# Patient Record
Sex: Male | Born: 1935
Health system: Southern US, Community
[De-identification: ages and names within clinical notes are randomized; demographics above are authoritative.]

## PROBLEM LIST (undated history)

## (undated) DIAGNOSIS — G4730 Sleep apnea, unspecified: Secondary | ICD-10-CM

## (undated) DIAGNOSIS — L570 Actinic keratosis: Secondary | ICD-10-CM

## (undated) DIAGNOSIS — C801 Malignant (primary) neoplasm, unspecified: Secondary | ICD-10-CM

## (undated) DIAGNOSIS — R251 Tremor, unspecified: Secondary | ICD-10-CM

## (undated) DIAGNOSIS — N189 Chronic kidney disease, unspecified: Secondary | ICD-10-CM

## (undated) DIAGNOSIS — R600 Localized edema: Secondary | ICD-10-CM

## (undated) DIAGNOSIS — R609 Edema, unspecified: Secondary | ICD-10-CM

## (undated) DIAGNOSIS — C4492 Squamous cell carcinoma of skin, unspecified: Secondary | ICD-10-CM

## (undated) DIAGNOSIS — N400 Enlarged prostate without lower urinary tract symptoms: Secondary | ICD-10-CM

## (undated) DIAGNOSIS — M1990 Unspecified osteoarthritis, unspecified site: Secondary | ICD-10-CM

## (undated) DIAGNOSIS — H9313 Tinnitus, bilateral: Secondary | ICD-10-CM

## (undated) DIAGNOSIS — S065X9A Traumatic subdural hemorrhage with loss of consciousness of unspecified duration, initial encounter: Secondary | ICD-10-CM

## (undated) DIAGNOSIS — I10 Essential (primary) hypertension: Secondary | ICD-10-CM

## (undated) HISTORY — PX: EYE SURGERY: SHX253

## (undated) HISTORY — DX: Actinic keratosis: L57.0

## (undated) HISTORY — DX: Essential (primary) hypertension: I10

## (undated) HISTORY — PX: KNEE ARTHROSCOPY: SUR90

## (undated) HISTORY — DX: Squamous cell carcinoma of skin, unspecified: C44.92

## (undated) HISTORY — PX: COLONOSCOPY: SHX174

## (undated) HISTORY — DX: Unspecified osteoarthritis, unspecified site: M19.90

---
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
## 1962-10-01 HISTORY — PX: HERNIA REPAIR: SHX51

## 2001-10-01 HISTORY — PX: SEPTOPLASTY: SUR1290

## 2002-10-01 HISTORY — PX: OTHER SURGICAL HISTORY: SHX169

## 2004-12-15 ENCOUNTER — Ambulatory Visit: Payer: Self-pay | Admitting: General Surgery

## 2010-01-31 ENCOUNTER — Ambulatory Visit: Payer: Self-pay | Admitting: Unknown Physician Specialty

## 2010-02-17 ENCOUNTER — Ambulatory Visit: Payer: Self-pay | Admitting: Unknown Physician Specialty

## 2010-02-23 ENCOUNTER — Ambulatory Visit: Payer: Self-pay | Admitting: Unknown Physician Specialty

## 2013-06-09 ENCOUNTER — Ambulatory Visit: Payer: Self-pay | Admitting: Otolaryngology

## 2013-06-11 ENCOUNTER — Ambulatory Visit: Payer: Self-pay | Admitting: Otolaryngology

## 2014-02-26 DIAGNOSIS — I10 Essential (primary) hypertension: Secondary | ICD-10-CM | POA: Insufficient documentation

## 2014-02-26 DIAGNOSIS — R9431 Abnormal electrocardiogram [ECG] [EKG]: Secondary | ICD-10-CM | POA: Insufficient documentation

## 2014-02-26 DIAGNOSIS — E782 Mixed hyperlipidemia: Secondary | ICD-10-CM | POA: Insufficient documentation

## 2014-09-23 DIAGNOSIS — G4733 Obstructive sleep apnea (adult) (pediatric): Secondary | ICD-10-CM | POA: Insufficient documentation

## 2014-10-21 ENCOUNTER — Ambulatory Visit: Payer: Self-pay | Admitting: Internal Medicine

## 2015-01-21 NOTE — Op Note (Signed)
PATIENT NAME:  [facility], Preston Hill MR#:  [id number] DATE OF BIRTH:  [birth year]  DATE OF PROCEDURE:  06/11/2013  PREOPERATIVE DIAGNOSIS: Bilateral ptosis (MRD-1 of 0 on the right, 0.5 on the left).   POSTOPERATIVE DIAGNOSIS: Bilateral ptosis (MRD-1 of 0 on the right, 0.5 on the left).   PROCEDURE: Bilateral ptosis repair (Fasanella-Servat).   SURGEON: Janalee Dane, M.D.   DESCRIPTION OF PROCEDURE: The patient was placed in a supine position on the operating room table. After general LMA anesthesia had been induced, the patient was turned 90 degrees counterclockwise from anesthesia. The eyes were topically anesthetized with topical proparacaine followed by local injection of 0.5% lidocaine and 0.25% bupivacaine mixed 1:150,000 with epinephrine along the superior margin of the upper eyelid superior tarsal plate. Preoperative measurements of approximately 8 mm of conjunctiva and Mueller's muscle was determined to be the appropriate amount to remove. Therefore, that amount was gathered in the Putterman clamp on the right after the 5-0 silk retaining suture had been placed. A single row of horizontal mattress 5-0 fast absorbing gut on a S-14 needle was placed from medial to lateral. The conjunctiva and Mueller's muscle and the Putterman clamp was then removed sharply and a second row of suture was placed from medial to lateral tying the knot laterally and burying it in the lateral conjunctiva. The eye was then returned to its native position, iced gauze was immediately placed and an identical procedure was performed on the left side taking very slightly less conjunctiva.  Gentak eye ointment was then placed under the eyes. Iced gauze was left on the eyes as the patient was taken to the recovery room. There were no complications. Estimated blood loss less than 5 mL.  ____________________________ J. Nadeen Landau, MD [facility] D: 06/11/2013 09:55:03 ET T: 06/11/2013 10:02:38 ET JOB#: [id number]  cc: Janalee Dane, MD, <Dictator> Nicholos Johns MD ELECTRONICALLY SIGNED 06/26/2013 9:58

## 2015-12-13 ENCOUNTER — Ambulatory Visit: Payer: Self-pay | Admitting: General Surgery

## 2015-12-20 ENCOUNTER — Encounter: Payer: Self-pay | Admitting: General Surgery

## 2015-12-20 ENCOUNTER — Other Ambulatory Visit: Payer: Self-pay | Admitting: General Surgery

## 2015-12-20 ENCOUNTER — Ambulatory Visit (INDEPENDENT_AMBULATORY_CARE_PROVIDER_SITE_OTHER): Payer: Medicare PPO | Admitting: General Surgery

## 2015-12-20 VITALS — BP 140/82 | HR 76 | Resp 14 | Ht 64.0 in | Wt 197.0 lb

## 2015-12-20 DIAGNOSIS — Z1211 Encounter for screening for malignant neoplasm of colon: Secondary | ICD-10-CM

## 2015-12-20 MED ORDER — POLYETHYLENE GLYCOL 3350 17 GM/SCOOP PO POWD: 1.0000 | Freq: Once | ORAL | Status: DC

## 2015-12-20 NOTE — Progress Notes (Signed)
Patient ID: Preston Hill, male   DOB: [birth year], 82 y.o.   MRN: [id number]  Chief Complaint  Patient presents with  . Colonoscopy    HPI Preston Hill is a 82 y.o. male here today for a evaluation of a screening colonoscopy. Last colonoscopy was 12/05/2004. No GI problems at this time.  I personally reviewed the patient's history. HPI  Past Medical History  Diagnosis Date  . Hypertension   . Arthritis     Past Surgical History  Procedure Laterality Date  . Colonoscopy  3817/2006  . Septoplasty  2003  . Uvul  2004    repair  . Hernia repair  1964    No family history on file.  Social History Social History  Substance Use Topics  . Smoking status: Never Smoker   . Smokeless tobacco: None  . Alcohol Use: No    Allergies  Allergen Reactions  . Codeine Anxiety    Sick     Current Outpatient Prescriptions  Medication Sig Dispense Refill  . amLODipine (NORVASC) 5 MG tablet Take 5 mg by mouth daily.    . Glucosamine Sulfate 1000 MG CAPS Take by mouth.    Marland Kitchen lisinopril-hydrochlorothiazide (PRINZIDE,ZESTORETIC) 20-12.5 MG tablet Take 1 tablet by mouth daily.    . Potassium Chloride Crys CR (KLOR-CON M20 PO) Take by mouth.    . propranolol (INDERAL) 40 MG tablet Take 40 mg by mouth 3 (three) times daily.    . tamsulosin (FLOMAX) 0.4 MG CAPS capsule Take 0.4 mg by mouth.    . vitamin E (VITAMIN E) 400 UNIT capsule Take 400 Units by mouth daily.    . polyethylene glycol powder (GLYCOLAX/MIRALAX) powder Take 255 g by mouth once. 255 grams one bottle for colonoscopy prep 255 g 0   No current facility-administered medications for this visit.    Review of Systems Review of Systems  Constitutional: Negative.   Respiratory: Negative.   Cardiovascular: Negative.     Blood pressure 140/82, pulse 76, resp. rate 14, height 5\' 4"  (1.626 m), weight 197 lb (89.359 kg).  Physical Exam Physical Exam  Constitutional: He is oriented to person, place, and time. He appears  well-developed and well-nourished.  Eyes: Conjunctivae are normal. No scleral icterus.  Neck: Neck supple.  Cardiovascular: Normal rate, regular rhythm and normal heart sounds.   Pulmonary/Chest: Effort normal and breath sounds normal.  Lymphadenopathy:    He has no cervical adenopathy.  Neurological: He is alert and oriented to person, place, and time.  Skin: Skin is warm and dry.    Data Reviewed Colonoscopy completed on 12/16/2014 showed a single hyperplastic polyp in the sigmoid colon.  November 20, 1999 colonoscopy showed a tubular adenoma in the transverse colon. I reticulosis was noted on both exams.  Assessment    Candidate for screening colonoscopy.    Plan    Screening indications were reviewed.    Colonoscopy with possible biopsy/polypectomy prn: Information regarding the procedure, including its potential risks and complications (including but not limited to perforation of the bowel, which may require emergency surgery to repair, and bleeding) was verbally given to the patient. Educational information regarding lower intestinal endoscopy was given to the patient. Written instructions for how to complete the bowel prep using Miralax were provided. The importance of drinking ample fluids to avoid dehydration as a result of the prep emphasized. PCP:  Benita Stabile C This information has been scribed by Gaspar Cola CMA.   [facility] 12/20/2015, 9:02 PM

## 2015-12-20 NOTE — Patient Instructions (Addendum)
Colonoscopy A colonoscopy is an exam to look at the entire large intestine (colon). This exam can help find problems such as tumors, polyps, inflammation, and areas of bleeding. The exam takes about 1 hour.  LET [facility] CARE PROVIDER KNOW ABOUT:   Any allergies you have.  All medicines you are taking, including vitamins, herbs, eye drops, creams, and over-the-counter medicines.  Previous problems you or members of your family have had with the use of anesthetics.  Any blood disorders you have.  Previous surgeries you have had.  Medical conditions you have. RISKS AND COMPLICATIONS  Generally, this is a safe procedure. However, as with any procedure, complications can occur. Possible complications include:  Bleeding.  Tearing or rupture of the colon wall.  Reaction to medicines given during the exam.  Infection (rare). BEFORE THE PROCEDURE   Ask your health care provider about changing or stopping your regular medicines.  You may be prescribed an oral bowel prep. This involves drinking a large amount of medicated liquid, starting the day before your procedure. The liquid will cause you to have multiple loose stools until your stool is almost clear or light green. This cleans out your colon in preparation for the procedure.  Do not eat or drink anything else once you have started the bowel prep, unless your health care provider tells you it is safe to do so.  Arrange for someone to drive you home after the procedure. PROCEDURE   You will be given medicine to help you relax (sedative).  You will lie on your side with your knees bent.  A long, flexible tube with a light and camera on the end (colonoscope) will be inserted through the rectum and into the colon. The camera sends video back to a computer screen as it moves through the colon. The colonoscope also releases carbon dioxide gas to inflate the colon. This helps your health care provider see the area better.  During  the exam, your health care provider may take a small tissue sample (biopsy) to be examined under a microscope if any abnormalities are found.  The exam is finished when the entire colon has been viewed. AFTER THE PROCEDURE   Do not drive for 24 hours after the exam.  You may have a small amount of blood in your stool.  You may pass moderate amounts of gas and have mild abdominal cramping or bloating. This is caused by the gas used to inflate your colon during the exam.  Ask when your test results will be ready and how you will get your results. Make sure you get your test results.   This information is not intended to replace advice given to you by your health care provider. Make sure you discuss any questions you have with your health care provider.   Document Released: 09/14/2000 Document Revised: 07/08/2013 Document Reviewed: 05/25/2013 Elsevier Interactive Patient Education [employer].  Patient has been scheduled for a colonoscopy on Feb 01, 2016.  Prescription for Miralax sent to [facility].  Patient has been given prep instructions.

## 2015-12-20 NOTE — H&P (Signed)
HPI  [facility] Hill is a 82 y.o. male here today for a evaluation of a screening colonoscopy. Last colonoscopy was 12/05/2004. No GI problems at this time.  I personally reviewed the patient's history.  HPI  Past Medical History   Diagnosis  Date   .  Hypertension    .  Arthritis     Past Surgical History   Procedure  Laterality  Date   .  Colonoscopy   3817/2006   .  Septoplasty   2003   .  Uvul   2004     repair   .  Hernia repair   1964    No family history on file.  Social History  Social History   Substance Use Topics   .  Smoking status:  Never Smoker   .  Smokeless tobacco:  None   .  Alcohol Use:  No    Allergies   Allergen  Reactions   .  Codeine  Anxiety     Sick    Current Outpatient Prescriptions   Medication  Sig  Dispense  Refill   .  amLODipine (NORVASC) 5 MG tablet  Take 5 mg by mouth daily.     .  Glucosamine Sulfate 1000 MG CAPS  Take by mouth.     Marland Kitchen  lisinopril-hydrochlorothiazide (PRINZIDE,ZESTORETIC) 20-12.5 MG tablet  Take 1 tablet by mouth daily.     .  Potassium Chloride Crys CR (KLOR-CON M20 PO)  Take by mouth.     .  propranolol (INDERAL) 40 MG tablet  Take 40 mg by mouth 3 (three) times daily.     .  tamsulosin (FLOMAX) 0.4 MG CAPS capsule  Take 0.4 mg by mouth.     .  vitamin E (VITAMIN E) 400 UNIT capsule  Take 400 Units by mouth daily.     .  polyethylene glycol powder (GLYCOLAX/MIRALAX) powder  Take 255 g by mouth once. 255 grams one bottle for colonoscopy prep  255 g  0    No current facility-administered medications for this visit.    Review of Systems  Review of Systems  Constitutional: Negative.  Respiratory: Negative.  Cardiovascular: Negative.   Blood pressure 140/82, pulse 76, resp. rate 14, height 5\' 4"  (1.626 m), weight 197 lb (89.359 kg).  Physical Exam  Physical Exam  Constitutional: He is oriented to person, place, and time. He appears well-developed and well-nourished.  Eyes: Conjunctivae are normal. No scleral icterus.   Neck: Neck supple.  Cardiovascular: Normal rate, regular rhythm and normal heart sounds.  Pulmonary/Chest: Effort normal and breath sounds normal.  Lymphadenopathy:  He has no cervical adenopathy.  Neurological: He is alert and oriented to person, place, and time.  Skin: Skin is warm and dry.   Data Reviewed  Colonoscopy completed on 12/16/2014 showed a single hyperplastic polyp in the sigmoid colon.  November 20, 1999 colonoscopy showed a tubular adenoma in the transverse colon. I reticulosis was noted on both exams.  Assessment   Candidate for screening colonoscopy.   Plan   Screening indications were reviewed.   Colonoscopy with possible biopsy/polypectomy prn: Information regarding the procedure, including its potential risks and complications (including but not limited to perforation of the bowel, which may require emergency surgery to repair, and bleeding) was verbally given to the patient. Educational information regarding lower intestinal endoscopy was given to the patient. Written instructions for how to complete the bowel prep using Miralax were provided. The importance of drinking ample fluids to  avoid dehydration as a result of the prep emphasized.  PCP: Benita Stabile C  This information has been scribed by Gaspar Cola CMA.  Robert Bellow  12/20/2015, 9:02 PM

## 2016-01-30 ENCOUNTER — Telehealth: Payer: Self-pay | Admitting: *Deleted

## 2016-01-30 NOTE — Telephone Encounter (Signed)
Patient contacted today and confirms no medication changes since last office visit. He also states he has Miralax prescription.   We will proceed with colonoscopy as scheduled for 02-01-16 at [facility].   This patient was instructed to call the office should he have further questions.

## 2016-01-31 ENCOUNTER — Encounter: Payer: Self-pay | Admitting: *Deleted

## 2016-02-01 ENCOUNTER — Ambulatory Visit
Admission: RE | Admit: 2016-02-01 | Discharge: 2016-02-01 | Disposition: A | Discharge status: Home / Self Care | Payer: Medicare PPO | Source: Ambulatory Visit | Attending: General Surgery | Admitting: General Surgery

## 2016-02-01 ENCOUNTER — Ambulatory Visit: Payer: Medicare PPO | Admitting: *Deleted

## 2016-02-01 ENCOUNTER — Encounter
Admission: RE | Disposition: A | Discharge status: Home / Self Care | Payer: Self-pay | Source: Ambulatory Visit | Attending: General Surgery

## 2016-02-01 ENCOUNTER — Encounter: Payer: Self-pay | Admitting: *Deleted

## 2016-02-01 DIAGNOSIS — Z7982 Long term (current) use of aspirin: Secondary | ICD-10-CM | POA: Insufficient documentation

## 2016-02-01 DIAGNOSIS — G4730 Sleep apnea, unspecified: Secondary | ICD-10-CM | POA: Insufficient documentation

## 2016-02-01 DIAGNOSIS — Z79899 Other long term (current) drug therapy: Secondary | ICD-10-CM | POA: Insufficient documentation

## 2016-02-01 DIAGNOSIS — Z1211 Encounter for screening for malignant neoplasm of colon: Secondary | ICD-10-CM | POA: Diagnosis not present

## 2016-02-01 DIAGNOSIS — I129 Hypertensive chronic kidney disease with stage 1 through stage 4 chronic kidney disease, or unspecified chronic kidney disease: Secondary | ICD-10-CM | POA: Insufficient documentation

## 2016-02-01 DIAGNOSIS — K5730 Diverticulosis of large intestine without perforation or abscess without bleeding: Secondary | ICD-10-CM | POA: Diagnosis not present

## 2016-02-01 DIAGNOSIS — N189 Chronic kidney disease, unspecified: Secondary | ICD-10-CM | POA: Diagnosis not present

## 2016-02-01 HISTORY — PX: COLONOSCOPY WITH PROPOFOL: SHX5780

## 2016-02-01 HISTORY — DX: Chronic kidney disease, unspecified: N18.9

## 2016-02-01 HISTORY — DX: Sleep apnea, unspecified: G47.30

## 2016-02-01 SURGERY — COLONOSCOPY WITH PROPOFOL
Anesthesia: General

## 2016-02-01 MED ORDER — PROPOFOL 500 MG/50ML IV EMUL
INTRAVENOUS | Status: DC | PRN
  Administered 2016-02-01: 50 ug/kg/min via INTRAVENOUS

## 2016-02-01 MED ORDER — SODIUM CHLORIDE 0.9 % IV SOLN
INTRAVENOUS | Status: DC
  Administered 2016-02-01: 1000 mL via INTRAVENOUS

## 2016-02-01 NOTE — Transfer of Care (Signed)
Immediate Anesthesia Transfer of Care Note  Patient: [facility] Hill  Procedure(s) Performed: Procedure(s): COLONOSCOPY WITH PROPOFOL (N/A)  Patient Location: PACU  Anesthesia Type:General  Level of Consciousness: awake, alert  and oriented  Airway & Oxygen Therapy: Patient Spontanous Breathing and Patient connected to nasal cannula oxygen  Post-op Assessment: Report given to RN and Post -op Vital signs reviewed and stable  Post vital signs: Reviewed and stable  Last Vitals:  Filed Vitals:   02/01/16 0808  BP: 175/80  Pulse: 54  Temp: 36.5 C  Resp: 16    Last Pain: There were no vitals filed for this visit.       Complications: No apparent anesthesia complications

## 2016-02-01 NOTE — Anesthesia Preprocedure Evaluation (Signed)
Anesthesia Evaluation  Patient identified by MRN, date of birth, ID band Patient awake    Reviewed: Allergy & Precautions, H&P , NPO status , Patient's Chart, lab work & pertinent test results, reviewed documented beta blocker date and time   History of Anesthesia Complications Negative for: history of anesthetic complications  Airway Mallampati: III  TM Distance: >3 FB Neck ROM: full    Dental no notable dental hx. (+) Teeth Intact   Pulmonary neg shortness of breath, sleep apnea and Continuous Positive Airway Pressure Ventilation , neg COPD, neg recent URI,    Pulmonary exam normal breath sounds clear to auscultation       Cardiovascular Exercise Tolerance: Good hypertension, (-) angina(-) CAD, (-) Past MI, (-) Cardiac Stents and (-) CABG Normal cardiovascular exam(-) dysrhythmias (-) Valvular Problems/Murmurs Rhythm:regular Rate:Normal     Neuro/Psych negative neurological ROS  negative psych ROS   GI/Hepatic negative GI ROS, Neg liver ROS,   Endo/Other  negative endocrine ROS  Renal/GU CRFRenal disease  negative genitourinary   Musculoskeletal   Abdominal   Peds  Hematology negative hematology ROS (+)   Anesthesia Other Findings Past Medical History:   Hypertension                                                 Arthritis                                                    Sleep apnea                                                  Chronic kidney disease                                       Reproductive/Obstetrics negative OB ROS                             Anesthesia Physical Anesthesia Plan  ASA: II  Anesthesia Plan: General   Post-op Pain Management:    Induction:   Airway Management Planned:   Additional Equipment:   Intra-op Plan:   Post-operative Plan:   Informed Consent: I have reviewed the patients History and Physical, chart, labs and discussed the procedure  including the risks, benefits and alternatives for the proposed anesthesia with the patient or authorized representative who has indicated his/her understanding and acceptance.   Dental Advisory Given  Plan Discussed with: Anesthesiologist, CRNA and Surgeon  Anesthesia Plan Comments:         Anesthesia Quick Evaluation

## 2016-02-01 NOTE — Anesthesia Postprocedure Evaluation (Signed)
Anesthesia Post Note  Patient: Preston Hill  Procedure(s) Performed: Procedure(s) (LRB): COLONOSCOPY WITH PROPOFOL (N/A)  Patient location during evaluation: Endoscopy Anesthesia Type: General Level of consciousness: awake and alert Pain management: pain level controlled Vital Signs Assessment: post-procedure vital signs reviewed and stable Respiratory status: spontaneous breathing, nonlabored ventilation, respiratory function stable and patient connected to nasal cannula oxygen Cardiovascular status: blood pressure returned to baseline and stable Postop Assessment: no signs of nausea or vomiting Anesthetic complications: no    Last Vitals:  Filed Vitals:   02/01/16 0921 02/01/16 0931  BP: 122/78 140/79  Pulse: 55 48  Temp:    Resp: 19 16    Last Pain: There were no vitals filed for this visit.               Martha Clan

## 2016-02-01 NOTE — H&P (Signed)
ISAIAHS FRANKO [id number] [birth year]     HPI:  Healthy 82 y./o male for elective screening colonoscopy. No change in clinical history or exam.   Prescriptions prior to admission  Medication Sig Dispense Refill Last Dose  . amLODipine (NORVASC) 5 MG tablet Take 5 mg by mouth daily.   02/01/2016 at 0615  . aspirin 81 MG tablet Take 81 mg by mouth daily.   02/01/2016 at 0615  . lisinopril-hydrochlorothiazide (PRINZIDE,ZESTORETIC) 20-12.5 MG tablet Take 1 tablet by mouth daily.   02/01/2016 at 0615  . Potassium Chloride Crys CR (KLOR-CON M20 PO) Take by mouth.   02/01/2016 at 0615  . propranolol (INDERAL) 40 MG tablet Take 40 mg by mouth 2 (two) times daily.   02/01/2016 at 0615  . tamsulosin (FLOMAX) 0.4 MG CAPS capsule Take 0.4 mg by mouth.    at Unknown time  . vitamin E (VITAMIN E) 400 UNIT capsule Take 400 Units by mouth daily.   02/01/2016 at 0615  . Glucosamine Sulfate 1000 MG CAPS Take by mouth.   Taking  . polyethylene glycol powder (GLYCOLAX/MIRALAX) powder Take 255 g by mouth once. 255 grams one bottle for colonoscopy prep 255 g 0    Allergies  Allergen Reactions  . Codeine Anxiety    Sick    Past Medical History  Diagnosis Date  . Hypertension   . Arthritis   . Sleep apnea   . Chronic kidney disease    Past Surgical History  Procedure Laterality Date  . Colonoscopy  3817/2006  . Septoplasty  2003  . Uvul  2004    repair  . Hernia repair  1964   Social History   Social History  . Marital Status: Married    Spouse Name: N/A  . Number of Children: N/A  . Years of Education: N/A   Occupational History  . Not on file.   Social History Main Topics  . Smoking status: Never Smoker   . Smokeless tobacco: Never Used  . Alcohol Use: No  . Drug Use: No  . Sexual Activity: Not on file   Other Topics Concern  . Not on file   Social History Narrative   Social History   Social History Narrative     ROS: Negative.     PE: HEENT: Negative. Lungs: Clear. Cardio:  RR.  Assessment/Plan:  Proceed with planned endoscopy.    Robert Bellow 02/01/2016

## 2016-02-01 NOTE — Op Note (Signed)
[facility] Patient Name: Preston Hill Procedure Date: 02/01/2016 8:39 AM MRN: [id number] Account #: [id number] Date of Birth: 1934-08-13 Admit Type: Outpatient Age: 82 Room: [facility] ENDO ROOM 1 Gender: Male Note Status: Finalized Procedure:            Colonoscopy Indications:          Screening for colorectal malignant neoplasm Providers:            Robert Bellow, MD Referring MD:         Leona Carry. Hall Busing, MD (Referring MD) Medicines:            Monitored Anesthesia Care Complications:        No immediate complications. Procedure:            Pre-Anesthesia Assessment:                       - Prior to the procedure, a History and Physical was                        performed, and patient medications, allergies and                        sensitivities were reviewed. The patient's tolerance of                        previous anesthesia was reviewed.                       - The risks and benefits of the procedure and the                        sedation options and risks were discussed with the                        patient. All questions were answered and informed                        consent was obtained.                       After obtaining informed consent, the colonoscope was                        passed under direct vision. Throughout the procedure,                        the patient's blood pressure, pulse, and oxygen                        saturations were monitored continuously. The                        Colonoscope was introduced through the anus and                        advanced to the the terminal ileum. The colonoscopy was                        performed without difficulty. The patient tolerated the  procedure well. The quality of the bowel preparation                        was excellent. Findings:      A few medium-mouthed diverticula were found in the sigmoid colon and       ascending colon.      The  retroflexed view of the distal rectum and anal verge was normal and       showed no anal or rectal abnormalities. Impression:           - Diverticulosis in the sigmoid colon and in the                        ascending colon.                       - The distal rectum and anal verge are normal on                        retroflexion view.                       - No specimens collected. Recommendation:       - Discharge patient to home (via wheelchair). Procedure Code(s):    --- Professional ---                       [phone], Colonoscopy, flexible; diagnostic, including                        collection of specimen(s) by brushing or washing, when                        performed (separate procedure) Diagnosis Code(s):    --- Professional ---                       Z12.11, Encounter for screening for malignant neoplasm                        of colon CPT copyright 2016 [employer]. All rights reserved. The codes documented in this report are preliminary and upon coder review may  be revised to meet current compliance requirements. Robert Bellow, MD 02/01/2016 8:59:20 AM This report has been signed electronically. Number of Addenda: 0 Note Initiated On: 02/01/2016 8:39 AM Scope Withdrawal Time: 0 hours 6 minutes 40 seconds  Total Procedure Duration: 0 hours 13 minutes 1 second       [facility]

## 2016-02-03 ENCOUNTER — Encounter: Payer: Self-pay | Admitting: General Surgery

## 2016-02-22 ENCOUNTER — Encounter: Payer: Self-pay | Admitting: General Surgery

## 2016-03-01 ENCOUNTER — Encounter: Payer: Self-pay | Admitting: General Surgery

## 2016-03-13 ENCOUNTER — Encounter: Payer: Self-pay | Admitting: General Surgery

## 2017-03-12 ENCOUNTER — Other Ambulatory Visit: Payer: Self-pay | Admitting: Surgery

## 2017-03-12 DIAGNOSIS — M1712 Unilateral primary osteoarthritis, left knee: Secondary | ICD-10-CM

## 2017-03-18 ENCOUNTER — Ambulatory Visit
Admission: RE | Admit: 2017-03-18 | Discharge: 2017-03-18 | Disposition: A | Discharge status: Home / Self Care | Payer: Medicare PPO | Source: Ambulatory Visit | Attending: Surgery | Admitting: Surgery

## 2017-03-18 DIAGNOSIS — M1712 Unilateral primary osteoarthritis, left knee: Secondary | ICD-10-CM | POA: Insufficient documentation

## 2017-03-18 DIAGNOSIS — X58XXXA Exposure to other specified factors, initial encounter: Secondary | ICD-10-CM | POA: Diagnosis not present

## 2017-03-18 DIAGNOSIS — S83242A Other tear of medial meniscus, current injury, left knee, initial encounter: Secondary | ICD-10-CM | POA: Diagnosis not present

## 2017-03-22 DIAGNOSIS — M1712 Unilateral primary osteoarthritis, left knee: Secondary | ICD-10-CM | POA: Insufficient documentation

## 2017-04-25 ENCOUNTER — Encounter
Admission: RE | Admit: 2017-04-25 | Discharge: 2017-04-25 | Disposition: A | Discharge status: Home / Self Care | Payer: Medicare PPO | Source: Ambulatory Visit | Attending: Surgery | Admitting: Surgery

## 2017-04-25 DIAGNOSIS — I10 Essential (primary) hypertension: Secondary | ICD-10-CM | POA: Diagnosis not present

## 2017-04-25 DIAGNOSIS — Z01818 Encounter for other preprocedural examination: Secondary | ICD-10-CM | POA: Diagnosis present

## 2017-04-25 HISTORY — DX: Tremor, unspecified: R25.1

## 2017-04-25 HISTORY — DX: Malignant (primary) neoplasm, unspecified: C80.1

## 2017-04-25 HISTORY — DX: Tinnitus, bilateral: H93.13

## 2017-04-25 LAB — BASIC METABOLIC PANEL
Anion gap: 8 (ref 5–15)
BUN: 16 mg/dL (ref 6–20)
CALCIUM: 9.2 mg/dL (ref 8.9–10.3)
CO2: 27 mmol/L (ref 22–32)
Chloride: 105 mmol/L (ref 101–111)
Creatinine, Ser: 0.78 mg/dL (ref 0.61–1.24)
GFR calc Af Amer: >60 mL/min (ref >60)
GLUCOSE: 107 mg/dL — AB (ref 65–99)
Potassium: 3.6 mmol/L (ref 3.5–5.1)
Sodium: 140 mmol/L (ref 135–145)

## 2017-04-25 LAB — CBC
HEMATOCRIT: 41.4 % (ref 40.0–52.0)
Hemoglobin: 14.3 g/dL (ref 13.0–18.0)
MCH: 31 pg (ref 26.0–34.0)
MCHC: 34.6 g/dL (ref 32.0–36.0)
MCV: 89.7 fL (ref 80.0–100.0)
Platelets: 161 10*3/uL (ref 150–440)
RBC: 4.61 MIL/uL (ref 4.40–5.90)
RDW: 12.7 % (ref 11.5–14.5)
WBC: 4.8 10*3/uL (ref 3.8–10.6)

## 2017-04-25 LAB — DIFFERENTIAL
BASOS PCT: 1 %
Basophils Absolute: 0 10*3/uL (ref 0–0.1)
EOS ABS: 0.1 10*3/uL (ref 0–0.7)
Eosinophils Relative: 3 %
Lymphocytes Relative: 31 %
Lymphs Abs: 1.5 10*3/uL (ref 1.0–3.6)
MONO ABS: 0.4 10*3/uL (ref 0.2–1.0)
MONOS PCT: 10 %
Neutro Abs: 2.6 10*3/uL (ref 1.4–6.5)
Neutrophils Relative %: 55 %

## 2017-04-25 NOTE — Patient Instructions (Signed)
Your procedure is scheduled on: May 02, 2017  (THURSDAY)  Report to Same Day Surgery 2nd floor medical mall [facility] Entrance-take elevator on left to 2nd floor.  Check in with surgery information desk.) To find out your arrival time please call [phone] between 1PM - 3PM on May 01, 2017 [facility] )  Remember: Instructions that are not followed completely may result in serious medical risk, up to and including death, or upon the discretion of your surgeon and anesthesiologist your surgery may need to be rescheduled.    _x___ 1. Do not eat food or drink liquids after midnight. No gum chewing or hard candies                                __x__ 2. No Alcohol for 24 hours before or after surgery.   __x__3. No Smoking for 24 prior to surgery.   ____  4. Bring all medications with you on the day of surgery if instructed.    __x__ 5. Notify your doctor if there is any change in your medical condition     (cold, fever, infections).     Do not wear jewelry, make-up, hairpins, clips or nail polish.  Do not wear lotions, powders, or perfumes.   Do not shave 48 hours prior to surgery. Men may shave face and neck.  Do not bring valuables to the hospital.    [facility] is not responsible for any belongings or valuables.               Contacts, dentures or bridgework may not be worn into surgery.  Leave your suitcase in the car. After surgery it may be brought to your room.  For patients admitted to the hospital, discharge time is determined by your treatment team.                    Patients discharged the day of surgery will not be allowed to drive home.  You will need someone to drive you home and stay with you the night of your procedure.    Please read over the following fact sheets that you were given:   [facility] Preparing for Surgery and or MRSA Information   TAKE THE FOLLOWING MEDICATIONS THE MORNING OF SURGERY WITH A SIP OF WATER :  1. AMLODIPINE  2. LISINOPRIL  3.  PROPRANOLOL  4.  5.  6.  ____Fleets enema or Magnesium Citrate as directed.   _x___ Use CHG Soap or sage wipes as directed on instruction sheet   ____ Use inhalers on the day of surgery and bring to hospital day of surgery  ____ Stop Metformin and Janumet 2 days prior to surgery.    ____ Take 1/2 of usual insulin dose the night before surgery and none on the morning surgery      _x___ Follow recommendations from Cardiologist, Pulmonologist or PCP regarding          stopping Aspirin, Coumadin, Plavix ,Eliquis, Effient, or Pradaxa, and Pletal. (STOP ASPIRIN NOW )  X____Stop Anti-inflammatories such as Advil, Aleve, Ibuprofen, Motrin, Naproxen, Naprosyn, Goodies powders or aspirin products. OK to take Tylenol  (STOP IBUPROFEN AND NAPROXEN NOW )   _x___ Stop supplements until after surgery.  But may continue Vitamin D, Vitamin B,and multivitamin (STOP VITAMIN E AND GLUCOSAMINE NOW )          _x__ Bring C-Pap to the hospital.

## 2017-05-01 MED ORDER — CEFAZOLIN SODIUM-DEXTROSE 2-4 GM/100ML-% IV SOLN
2.0000 g | Freq: Once | INTRAVENOUS | Status: AC
  Administered 2017-05-02: 2 g via INTRAVENOUS

## 2017-05-02 ENCOUNTER — Encounter
Admission: RE | Disposition: A | Discharge status: Home / Self Care | Payer: Self-pay | Source: Ambulatory Visit | Attending: Surgery

## 2017-05-02 ENCOUNTER — Ambulatory Visit
Admission: RE | Admit: 2017-05-02 | Discharge: 2017-05-02 | Disposition: A | Discharge status: Home / Self Care | Payer: Medicare PPO | Source: Ambulatory Visit | Attending: Surgery | Admitting: Surgery

## 2017-05-02 ENCOUNTER — Ambulatory Visit: Payer: Medicare PPO | Admitting: Anesthesiology

## 2017-05-02 ENCOUNTER — Encounter: Payer: Self-pay | Admitting: *Deleted

## 2017-05-02 DIAGNOSIS — N400 Enlarged prostate without lower urinary tract symptoms: Secondary | ICD-10-CM | POA: Diagnosis not present

## 2017-05-02 DIAGNOSIS — Z7982 Long term (current) use of aspirin: Secondary | ICD-10-CM | POA: Insufficient documentation

## 2017-05-02 DIAGNOSIS — Z9989 Dependence on other enabling machines and devices: Secondary | ICD-10-CM | POA: Insufficient documentation

## 2017-05-02 DIAGNOSIS — M23242 Derangement of anterior horn of lateral meniscus due to old tear or injury, left knee: Secondary | ICD-10-CM | POA: Diagnosis not present

## 2017-05-02 DIAGNOSIS — M2242 Chondromalacia patellae, left knee: Secondary | ICD-10-CM | POA: Insufficient documentation

## 2017-05-02 DIAGNOSIS — N189 Chronic kidney disease, unspecified: Secondary | ICD-10-CM | POA: Diagnosis not present

## 2017-05-02 DIAGNOSIS — I129 Hypertensive chronic kidney disease with stage 1 through stage 4 chronic kidney disease, or unspecified chronic kidney disease: Secondary | ICD-10-CM | POA: Insufficient documentation

## 2017-05-02 DIAGNOSIS — Z85828 Personal history of other malignant neoplasm of skin: Secondary | ICD-10-CM | POA: Diagnosis not present

## 2017-05-02 DIAGNOSIS — M1712 Unilateral primary osteoarthritis, left knee: Secondary | ICD-10-CM | POA: Diagnosis present

## 2017-05-02 DIAGNOSIS — E669 Obesity, unspecified: Secondary | ICD-10-CM | POA: Insufficient documentation

## 2017-05-02 DIAGNOSIS — M23222 Derangement of posterior horn of medial meniscus due to old tear or injury, left knee: Secondary | ICD-10-CM | POA: Insufficient documentation

## 2017-05-02 DIAGNOSIS — Z79899 Other long term (current) drug therapy: Secondary | ICD-10-CM | POA: Insufficient documentation

## 2017-05-02 DIAGNOSIS — G4733 Obstructive sleep apnea (adult) (pediatric): Secondary | ICD-10-CM | POA: Insufficient documentation

## 2017-05-02 DIAGNOSIS — Z885 Allergy status to narcotic agent status: Secondary | ICD-10-CM | POA: Insufficient documentation

## 2017-05-02 DIAGNOSIS — Z6831 Body mass index (BMI) 31.0-31.9, adult: Secondary | ICD-10-CM | POA: Insufficient documentation

## 2017-05-02 HISTORY — PX: KNEE ARTHROSCOPY WITH MEDIAL MENISECTOMY: SHX5651

## 2017-05-02 HISTORY — PX: KNEE ARTHROSCOPY WITH MENISCAL REPAIR: SHX5653

## 2017-05-02 SURGERY — ARTHROSCOPY, KNEE, WITH MEDIAL MENISCECTOMY
Anesthesia: General | Laterality: Left | Wound class: Clean

## 2017-05-02 MED ORDER — LIDOCAINE HCL (PF) 2 % IJ SOLN
INTRAMUSCULAR | Status: AC
Start: 2017-05-02 — End: 2017-05-02
  Filled 2017-05-02: qty 2

## 2017-05-02 MED ORDER — FAMOTIDINE 20 MG PO TABS
ORAL_TABLET | ORAL | Status: AC
  Administered 2017-05-02: 20 mg via ORAL
  Filled 2017-05-02: qty 1

## 2017-05-02 MED ORDER — EPHEDRINE SULFATE 50 MG/ML IJ SOLN
INTRAMUSCULAR | Status: DC | PRN
  Administered 2017-05-02 (×3): 5 mg via INTRAVENOUS
  Administered 2017-05-02: 10 mg via INTRAVENOUS

## 2017-05-02 MED ORDER — ONDANSETRON HCL 4 MG/2ML IJ SOLN
INTRAMUSCULAR | Status: DC | PRN
  Administered 2017-05-02: 4 mg via INTRAVENOUS

## 2017-05-02 MED ORDER — DEXAMETHASONE SODIUM PHOSPHATE 10 MG/ML IJ SOLN
INTRAMUSCULAR | Status: AC
  Filled 2017-05-02: qty 1

## 2017-05-02 MED ORDER — FENTANYL CITRATE (PF) 100 MCG/2ML IJ SOLN
INTRAMUSCULAR | Status: AC
  Filled 2017-05-02: qty 2

## 2017-05-02 MED ORDER — HYDROCODONE-ACETAMINOPHEN 5-325 MG PO TABS: 1.0000 | ORAL_TABLET | ORAL | Status: DC | PRN

## 2017-05-02 MED ORDER — PROPOFOL 10 MG/ML IV BOLUS
INTRAVENOUS | Status: AC
  Filled 2017-05-02: qty 20

## 2017-05-02 MED ORDER — FENTANYL CITRATE (PF) 100 MCG/2ML IJ SOLN
25.0000 ug | INTRAMUSCULAR | Status: DC | PRN
Start: 2017-05-02 — End: 2017-05-02

## 2017-05-02 MED ORDER — ONDANSETRON HCL 4 MG PO TABS: 4.0000 mg | ORAL_TABLET | Freq: Four times a day (QID) | ORAL | Status: DC | PRN

## 2017-05-02 MED ORDER — PHENYLEPHRINE HCL 10 MG/ML IJ SOLN
INTRAMUSCULAR | Status: DC | PRN
  Administered 2017-05-02 (×2): 100 ug via INTRAVENOUS
  Administered 2017-05-02: 200 ug via INTRAVENOUS

## 2017-05-02 MED ORDER — METOCLOPRAMIDE HCL 10 MG PO TABS: 5.0000 mg | ORAL_TABLET | Freq: Three times a day (TID) | ORAL | Status: DC | PRN

## 2017-05-02 MED ORDER — HYDROCODONE-ACETAMINOPHEN 5-325 MG PO TABS: 1.0000 | ORAL_TABLET | Freq: Four times a day (QID) | ORAL | 0 refills | Status: DC | PRN

## 2017-05-02 MED ORDER — ONDANSETRON HCL 4 MG/2ML IJ SOLN: 4.0000 mg | Freq: Four times a day (QID) | INTRAMUSCULAR | Status: DC | PRN

## 2017-05-02 MED ORDER — FAMOTIDINE 20 MG PO TABS
20.0000 mg | ORAL_TABLET | Freq: Once | ORAL | Status: AC
  Administered 2017-05-02: 20 mg via ORAL

## 2017-05-02 MED ORDER — ONDANSETRON HCL 4 MG/2ML IJ SOLN
INTRAMUSCULAR | Status: AC
Start: 2017-05-02 — End: 2017-05-02
  Filled 2017-05-02: qty 2

## 2017-05-02 MED ORDER — BUPIVACAINE-EPINEPHRINE (PF) 0.5% -1:200000 IJ SOLN
INTRAMUSCULAR | Status: DC | PRN
  Administered 2017-05-02: 60 mL via PERINEURAL
  Administered 2017-05-02: 10 mL via PERINEURAL

## 2017-05-02 MED ORDER — PROPOFOL 10 MG/ML IV BOLUS
INTRAVENOUS | Status: DC | PRN
  Administered 2017-05-02: 30 mg via INTRAVENOUS
  Administered 2017-05-02: 150 mg via INTRAVENOUS

## 2017-05-02 MED ORDER — DEXAMETHASONE SODIUM PHOSPHATE 10 MG/ML IJ SOLN
INTRAMUSCULAR | Status: DC | PRN
  Administered 2017-05-02: 5 mg via INTRAVENOUS

## 2017-05-02 MED ORDER — PROMETHAZINE HCL 25 MG/ML IJ SOLN: 6.2500 mg | INTRAMUSCULAR | Status: DC | PRN

## 2017-05-02 MED ORDER — CEFAZOLIN SODIUM-DEXTROSE 2-4 GM/100ML-% IV SOLN
INTRAVENOUS | Status: AC
  Filled 2017-05-02: qty 100

## 2017-05-02 MED ORDER — FENTANYL CITRATE (PF) 100 MCG/2ML IJ SOLN
INTRAMUSCULAR | Status: DC | PRN
  Administered 2017-05-02: 50 ug via INTRAVENOUS
  Administered 2017-05-02 (×2): 25 ug via INTRAVENOUS

## 2017-05-02 MED ORDER — MEPERIDINE HCL 50 MG/ML IJ SOLN: 6.2500 mg | INTRAMUSCULAR | Status: DC | PRN

## 2017-05-02 MED ORDER — OXYCODONE HCL 5 MG/5ML PO SOLN: 5.0000 mg | Freq: Once | ORAL | Status: DC | PRN

## 2017-05-02 MED ORDER — METOCLOPRAMIDE HCL 5 MG/ML IJ SOLN: 5.0000 mg | Freq: Three times a day (TID) | INTRAMUSCULAR | Status: DC | PRN

## 2017-05-02 MED ORDER — LIDOCAINE HCL (CARDIAC) 20 MG/ML IV SOLN
INTRAVENOUS | Status: DC | PRN
  Administered 2017-05-02: 80 mg via INTRAVENOUS

## 2017-05-02 MED ORDER — LACTATED RINGERS IV SOLN
INTRAVENOUS | Status: DC
  Administered 2017-05-02: 11:00:00 via INTRAVENOUS

## 2017-05-02 MED ORDER — EPHEDRINE SULFATE 50 MG/ML IJ SOLN
INTRAMUSCULAR | Status: AC
  Filled 2017-05-02: qty 1

## 2017-05-02 MED ORDER — BUPIVACAINE-EPINEPHRINE (PF) 0.5% -1:200000 IJ SOLN
INTRAMUSCULAR | Status: AC
  Filled 2017-05-02: qty 60

## 2017-05-02 MED ORDER — LIDOCAINE HCL (PF) 1 % IJ SOLN
INTRAMUSCULAR | Status: AC
  Filled 2017-05-02: qty 30

## 2017-05-02 MED ORDER — OXYCODONE HCL 5 MG PO TABS: 5.0000 mg | ORAL_TABLET | Freq: Once | ORAL | Status: DC | PRN

## 2017-05-02 SURGICAL SUPPLY — 28 items
BANDAGE ACE 6X5 VEL STRL LF (GAUZE/BANDAGES/DRESSINGS) ×4 IMPLANT
BLADE SHAVER 4.5X7 STR FR (MISCELLANEOUS) ×4 IMPLANT
CHLORAPREP W/TINT 26ML (MISCELLANEOUS) ×4 IMPLANT
CUFF TOURN 24 STER (MISCELLANEOUS) ×4 IMPLANT
DRAPE IMP U-DRAPE 54X76 (DRAPES) ×4 IMPLANT
ELECT REM PT RETURN 9FT ADLT (ELECTROSURGICAL) ×4
ELECTRODE REM PT RTRN 9FT ADLT (ELECTROSURGICAL) ×2 IMPLANT
GAUZE SPONGE 4X4 12PLY STRL (GAUZE/BANDAGES/DRESSINGS) ×4 IMPLANT
GLOVE BIO SURGEON STRL SZ8 (GLOVE) ×8 IMPLANT
GLOVE BIOGEL M 7.0 STRL (GLOVE) ×8 IMPLANT
GLOVE BIOGEL PI IND STRL 7.5 (GLOVE) ×2 IMPLANT
GLOVE BIOGEL PI INDICATOR 7.5 (GLOVE) ×2
GLOVE INDICATOR 8.0 STRL GRN (GLOVE) ×4 IMPLANT
GOWN STRL REUS W/ TWL LRG LVL3 (GOWN DISPOSABLE) ×2 IMPLANT
GOWN STRL REUS W/ TWL XL LVL3 (GOWN DISPOSABLE) ×4 IMPLANT
GOWN STRL REUS W/TWL LRG LVL3 (GOWN DISPOSABLE) ×2
GOWN STRL REUS W/TWL XL LVL3 (GOWN DISPOSABLE) ×4
IV LACTATED RINGER IRRG 3000ML (IV SOLUTION) ×2
IV LR IRRIG 3000ML ARTHROMATIC (IV SOLUTION) ×2 IMPLANT
KIT RM TURNOVER STRD PROC AR (KITS) ×4 IMPLANT
MANIFOLD NEPTUNE II (INSTRUMENTS) ×4 IMPLANT
NEEDLE HYPO 21X1.5 SAFETY (NEEDLE) ×4 IMPLANT
PACK ARTHROSCOPY KNEE (MISCELLANEOUS) ×4 IMPLANT
PENCIL ELECTRO HAND CTR (MISCELLANEOUS) ×4 IMPLANT
SUT PROLENE 4 0 PS 2 18 (SUTURE) ×4 IMPLANT
SYR 50ML LL SCALE MARK (SYRINGE) ×4 IMPLANT
TUBING ARTHRO INFLOW-ONLY STRL (TUBING) ×4 IMPLANT
WAND HAND CNTRL MULTIVAC 90 (MISCELLANEOUS) ×4 IMPLANT

## 2017-05-02 NOTE — Op Note (Signed)
05/02/2017  1:49 PM  Patient:   Preston Hill  Pre-Op Diagnosis:   Degenerative joint disease with degenerative medial and lateral meniscal tears, left knee.  Postoperative diagnosis:   Same.   Procedure:   Arthroscopic partial medial and lateral meniscectomies with abrasion chondroplasty of grade 3-4 chondromalacia involving femoral trochlea and medial femoral condyle, left knee.  Surgeon:   Pascal Lux, M.D.  Anesthesia:   General LMA.  Findings:   As above. There were extensive degenerative changes with areas of grade 4 chondromalacia involving the lateral patellar facet, the medial femoral condyle, and the medial tibial plateau. Grade 3 chondral malacia changes were noted involving the remainder of the medial tibial plateau and medial femoral condyle, as well as involving the femoral trochlea. Grade 1 chondromalacia changes were noted involving the lateral tibial plateau and lateral femoral condyle. The anterior and posterior cruciate ligaments both were in satisfactory condition.  Complications:   None.  EBL:   2 cc.  Total fluids:   700 cc of crystalloid.  Tourniquet time:   None  Drains:   None  Closure:   4-0 Prolene interrupted sutures.  Brief clinical note:   The patient is an 83 year old male with a long history of left knee pain. His symptoms have persisted despite medications, activity modification, steroid injections, etc. Decision examination consistent with degenerative joint disease with probable medial and lateral meniscal tears, all of which was confirmed by an MRI scan. The patient presents at this time for arthroscopy, debridement, and partial medial and lateral meniscectomies.  Procedure:   The patient was brought into the operating room and lain in the supine position. After adequate general laryngeal mask anesthesia was obtained, a timeout was performed to verify the appropriate side. The patient's left knee was injected sterilely using a solution of 30 cc  of 1% lidocaine and 30 cc of 0.5% Sensorcaine with epinephrine. The left lower extremity was prepped with ChloraPrep solution before being draped sterilely. Preoperative antibiotics were administered. The expected portal sites were injected with 0.5% Sensorcaine with epinephrine before the camera was placed in the anterolateral portal and instrumentation performed through the anteromedial portal. The knee was sequentially examined beginning in the suprapatellar pouch, then progressing to the patellofemoral space, the medial gutter compartment, the notch, and finally the lateral compartment and gutter. The findings were as described above. Abundant reactive synovial tissues anteriorly were debrided using the full-radius resector in order to improve visualization. The areas of degenerative tearing involving the posterior medial portion of the medial meniscus and the anterior portion of the lateral meniscus were debrided back to stable margins using the full-radius resector. In addition, there is loose articular cartilage in the medial femoral condyle and femoral trochlea were debrided back to stable margins using the full-radius resector. The ArthroCare wand was also used to lightly "anneal" the areas of loose articular cartilage, utilizing the coag setting and avoiding direct contact with the articular surface. The instruments were removed from the joint after suctioning the excess fluid. The portal sites were closed using 4-0 Prolene interrupted sutures before a sterile bulky dressing was applied to the knee. The patient was then awakened, extubated, and returned to the recovery room in satisfactory condition after tolerating the procedure well.

## 2017-05-02 NOTE — Anesthesia Procedure Notes (Signed)
Procedure Name: LMA Insertion Date/Time: 05/02/2017 12:28 PM Performed by: Hedda Slade Pre-anesthesia Checklist: Patient identified, Patient being monitored, Timeout performed, Emergency Drugs available and Suction available Patient Re-evaluated:Patient Re-evaluated prior to induction Oxygen Delivery Method: Circle system utilized Preoxygenation: Pre-oxygenation with 100% oxygen Induction Type: IV induction Ventilation: Mask ventilation without difficulty LMA: LMA inserted LMA Size: 4.0 Tube type: Oral Number of attempts: 1 Placement Confirmation: positive ETCO2 and breath sounds checked- equal and bilateral Tube secured with: Tape Dental Injury: Teeth and Oropharynx as per pre-operative assessment

## 2017-05-02 NOTE — H&P (Signed)
Paper H&P to be scanned into permanent record. H&P reviewed and patient re-examined. No changes. 

## 2017-05-02 NOTE — Anesthesia Post-op Follow-up Note (Cosign Needed)
Anesthesia QCDR form completed.        

## 2017-05-02 NOTE — Discharge Instructions (Addendum)
Keep dressing dry and intact.  May shower after dressing changed on post-op day #4 (Monday).  Cover staples/sutures with Band-Aids after drying off. Apply ice frequently to knee. Take ibuprofen 600 mg TID OR Aleve 2 tabs BID with meals for 7-10 days, then as necessary. Take pain medication as prescribed or ES Tylenol when needed.  May weight-bear as tolerated - use crutches or walker as needed. Follow-up in 10-14 days or as scheduled.  General Anesthesia, Adult, Care After These instructions provide you with information about caring for yourself after your procedure. Your health care provider may also give you more specific instructions. Your treatment has been planned according to current medical practices, but problems sometimes occur. Call your health care provider if you have any problems or questions after your procedure. What can I expect after the procedure? After the procedure, it is common to have:  Vomiting.  A sore throat.  Mental slowness.  It is common to feel:  Nauseous.  Cold or shivery.  Sleepy.  Tired.  Sore or achy, even in parts of your body where you did not have surgery.  Follow these instructions at home: For at least 24 hours after the procedure:  Do not: ? Participate in activities where you could fall or become injured. ? Drive. ? Use heavy machinery. ? Drink alcohol. ? Take sleeping pills or medicines that cause drowsiness. ? Make important decisions or sign legal documents. ? Take care of children on your own.  Rest. Eating and drinking  If you vomit, drink water, juice, or soup when you can drink without vomiting.  Drink enough fluid to keep your urine clear or pale yellow.  Make sure you have little or no nausea before eating solid foods.  Follow the diet recommended by your health care provider. General instructions  Have a responsible adult stay with you until you are awake and alert.  Return to your normal activities as told by  your health care provider. Ask your health care provider what activities are safe for you.  Take over-the-counter and prescription medicines only as told by your health care provider.  If you smoke, do not smoke without supervision.  Keep all follow-up visits as told by your health care provider. This is important. Contact a health care provider if:  You continue to have nausea or vomiting at home, and medicines are not helpful.  You cannot drink fluids or start eating again.  You cannot urinate after 8-12 hours.  You develop a skin rash.  You have fever.  You have increasing redness at the site of your procedure. Get help right away if:  You have difficulty breathing.  You have chest pain.  You have unexpected bleeding.  You feel that you are having a life-threatening or urgent problem. This information is not intended to replace advice given to you by your health care provider. Make sure you discuss any questions you have with your health care provider. Document Released: 12/24/2000 Document Revised: 02/20/2016 Document Reviewed: 09/01/2015 Elsevier Interactive Patient Education  [employer].

## 2017-05-02 NOTE — Transfer of Care (Signed)
Immediate Anesthesia Transfer of Care Note  Patient: Preston Hill  Procedure(s) Performed: Procedure(s): KNEE ARTHROSCOPY WITH MEDIAL MENISECTOMY (Left) KNEE ARTHROSCOPY WITH MENISCAL REPAIR  Patient Location: PACU  Anesthesia Type:General  Level of Consciousness: awake, alert  and oriented  Airway & Oxygen Therapy: Patient Spontanous Breathing and Patient connected to face mask oxygen  Post-op Assessment: Report given to RN and Post -op Vital signs reviewed and stable  Post vital signs: Reviewed and stable  Last Vitals:  Vitals:   05/02/17 1041 05/02/17 1325  BP: (!) 175/96 116/70  Pulse: (!) 57 (!) 48  Resp: 16 11  Temp: 36.6 C 36.6 C    Last Pain:  Vitals:   05/02/17 1041  TempSrc: Oral         Complications: No apparent anesthesia complications

## 2017-05-02 NOTE — Anesthesia Preprocedure Evaluation (Signed)
Anesthesia Evaluation  Patient identified by MRN, date of birth, ID band Patient awake    Reviewed: Allergy & Precautions, NPO status , Patient's Chart, lab work & pertinent test results  History of Anesthesia Complications Negative for: history of anesthetic complications  Airway Mallampati: III  TM Distance: >3 FB Neck ROM: Full    Dental no notable dental hx.    Pulmonary sleep apnea and Continuous Positive Airway Pressure Ventilation , neg COPD,    breath sounds clear to auscultation- rhonchi (-) wheezing      Cardiovascular Exercise Tolerance: Good hypertension, Pt. on medications (-) CAD, (-) Past MI and (-) Cardiac Stents  Rhythm:Regular Rate:Normal - Systolic murmurs and - Diastolic murmurs    Neuro/Psych negative neurological ROS  negative psych ROS   GI/Hepatic negative GI ROS, Neg liver ROS,   Endo/Other  negative endocrine ROSneg diabetes  Renal/GU negative Renal ROS     Musculoskeletal  (+) Arthritis ,   Abdominal (+) + obese,   Peds  Hematology negative hematology ROS (+)   Anesthesia Other Findings Past Medical History: No date: Arthritis No date: Cancer ([facility])     Comment:  Skin Cancer No date: Chronic kidney disease No date: Hypertension No date: Occasional tremors No date: Sleep apnea     Comment:  OSA---USE C-PAP No date: Tinnitus of both ears   Reproductive/Obstetrics                             Anesthesia Physical Anesthesia Plan  ASA: II  Anesthesia Plan: General   Post-op Pain Management:    Induction: Intravenous  PONV Risk Score and Plan: 1 and Ondansetron and Dexamethasone  Airway Management Planned: LMA  Additional Equipment:   Intra-op Plan:   Post-operative Plan:   Informed Consent: I have reviewed the patients History and Physical, chart, labs and discussed the procedure including the risks, benefits and alternatives for the proposed  anesthesia with the patient or authorized representative who has indicated his/her understanding and acceptance.   Dental advisory given  Plan Discussed with: CRNA and Anesthesiologist  Anesthesia Plan Comments:         Anesthesia Quick Evaluation

## 2017-05-03 NOTE — Anesthesia Postprocedure Evaluation (Signed)
Anesthesia Post Note  Patient: Preston Hill  Procedure(s) Performed: Procedure(s) (LRB): KNEE ARTHROSCOPY WITH MEDIAL MENISECTOMY (Left) KNEE ARTHROSCOPY WITH MENISCAL REPAIR  Patient location during evaluation: PACU Anesthesia Type: General Level of consciousness: awake Pain management: pain level controlled Vital Signs Assessment: post-procedure vital signs reviewed and stable Respiratory status: spontaneous breathing Cardiovascular status: stable Anesthetic complications: no     Last Vitals:  Vitals:   05/02/17 1429 05/02/17 1431  BP: (!) 166/96   Pulse:  (!) 57  Resp:  14  Temp:      Last Pain:  Vitals:   05/03/17 0822  TempSrc:   PainSc: 0-No pain                 VAN STAVEREN,Anely Spiewak

## 2017-05-29 ENCOUNTER — Other Ambulatory Visit: Payer: Self-pay | Admitting: Student

## 2017-05-29 DIAGNOSIS — Z9181 History of falling: Secondary | ICD-10-CM

## 2017-05-29 DIAGNOSIS — M25561 Pain in right knee: Secondary | ICD-10-CM

## 2017-06-05 ENCOUNTER — Ambulatory Visit
Admission: RE | Admit: 2017-06-05 | Discharge: 2017-06-05 | Disposition: A | Discharge status: Home / Self Care | Payer: Medicare PPO | Source: Ambulatory Visit | Attending: Student | Admitting: Student

## 2017-06-05 DIAGNOSIS — M25461 Effusion, right knee: Secondary | ICD-10-CM | POA: Diagnosis not present

## 2017-06-05 DIAGNOSIS — M2341 Loose body in knee, right knee: Secondary | ICD-10-CM | POA: Insufficient documentation

## 2017-06-05 DIAGNOSIS — Z9181 History of falling: Secondary | ICD-10-CM

## 2017-06-05 DIAGNOSIS — M25561 Pain in right knee: Secondary | ICD-10-CM

## 2017-06-05 DIAGNOSIS — M2410 Other articular cartilage disorders, unspecified site: Secondary | ICD-10-CM | POA: Insufficient documentation

## 2017-06-05 DIAGNOSIS — X58XXXA Exposure to other specified factors, initial encounter: Secondary | ICD-10-CM | POA: Insufficient documentation

## 2017-06-05 DIAGNOSIS — S72431A Displaced fracture of medial condyle of right femur, initial encounter for closed fracture: Secondary | ICD-10-CM | POA: Insufficient documentation

## 2017-06-17 ENCOUNTER — Ambulatory Visit: Payer: Medicare PPO | Admitting: Anesthesiology

## 2017-06-17 ENCOUNTER — Encounter: Payer: Self-pay | Admitting: *Deleted

## 2017-06-18 NOTE — Discharge Instructions (Signed)
General Anesthesia, Adult, Care After °These instructions provide you with information about caring for yourself after your procedure. Your health care provider may also give you more specific instructions. Your treatment has been planned according to current medical practices, but problems sometimes occur. Call your health care provider if you have any problems or questions after your procedure. °What can I expect after the procedure? °After the procedure, it is common to have: °· Vomiting. °· A sore throat. °· Mental slowness. ° °It is common to feel: °· Nauseous. °· Cold or shivery. °· Sleepy. °· Tired. °· Sore or achy, even in parts of your body where you did not have surgery. ° °Follow these instructions at home: °For at least 24 hours after the procedure: °· Do not: °? Participate in activities where you could fall or become injured. °? Drive. °? Use heavy machinery. °? Drink alcohol. °? Take sleeping pills or medicines that cause drowsiness. °? Make important decisions or sign legal documents. °? Take care of children on your own. °· Rest. °Eating and drinking °· If you vomit, drink water, juice, or soup when you can drink without vomiting. °· Drink enough fluid to keep your urine clear or pale yellow. °· Make sure you have little or no nausea before eating solid foods. °· Follow the diet recommended by your health care provider. °General instructions °· Have a responsible adult stay with you until you are awake and alert. °· Return to your normal activities as told by your health care provider. Ask your health care provider what activities are safe for you. °· Take over-the-counter and prescription medicines only as told by your health care provider. °· If you smoke, do not smoke without supervision. °· Keep all follow-up visits as told by your health care provider. This is important. °Contact a health care provider if: °· You continue to have nausea or vomiting at home, and medicines are not helpful. °· You  cannot drink fluids or start eating again. °· You cannot urinate after 8-12 hours. °· You develop a skin rash. °· You have fever. °· You have increasing redness at the site of your procedure. °Get help right away if: °· You have difficulty breathing. °· You have chest pain. °· You have unexpected bleeding. °· You feel that you are having a life-threatening or urgent problem. °This information is not intended to replace advice given to you by your health care provider. Make sure you discuss any questions you have with your health care provider. °Document Released: 12/24/2000 Document Revised: 02/20/2016 Document Reviewed: 09/01/2015 °Elsevier Interactive Patient Education © 2018 Elsevier Inc. ° °

## 2017-06-19 ENCOUNTER — Encounter
Admission: RE | Disposition: A | Discharge status: Home / Self Care | Payer: Self-pay | Source: Ambulatory Visit | Attending: Surgery

## 2017-06-19 ENCOUNTER — Ambulatory Visit
Admission: RE | Admit: 2017-06-19 | Discharge: 2017-06-19 | Disposition: A | Discharge status: Home / Self Care | Payer: Medicare PPO | Source: Ambulatory Visit | Attending: Surgery | Admitting: Surgery

## 2017-06-19 DIAGNOSIS — Z539 Procedure and treatment not carried out, unspecified reason: Secondary | ICD-10-CM | POA: Insufficient documentation

## 2017-06-19 DIAGNOSIS — M1711 Unilateral primary osteoarthritis, right knee: Secondary | ICD-10-CM | POA: Insufficient documentation

## 2017-06-19 SURGERY — ARTHROSCOPY, KNEE
Anesthesia: General | Laterality: Right

## 2017-06-19 SURGICAL SUPPLY — 30 items
BANDAGE ELASTIC 6 LF NS (GAUZE/BANDAGES/DRESSINGS) ×3 IMPLANT
BLADE FULL RADIUS 3.5 (BLADE) ×3 IMPLANT
BUR ACROMIONIZER 4.0 (BURR) IMPLANT
CHLORAPREP W/TINT 26ML (MISCELLANEOUS) ×3 IMPLANT
COVER LIGHT HANDLE UNIVERSAL (MISCELLANEOUS) ×6 IMPLANT
CUFF TOURN SGL QUICK 30 (MISCELLANEOUS)
CUFF TOURN SGL QUICK 34 (TOURNIQUET CUFF)
CUFF TRNQT CYL 34X4X40X1 (TOURNIQUET CUFF) IMPLANT
CUFF TRNQT CYL LO 30X4X (MISCELLANEOUS) IMPLANT
DRAPE IMP U-DRAPE 54X76 (DRAPES) ×3 IMPLANT
GAUZE SPONGE 4X4 12PLY STRL (GAUZE/BANDAGES/DRESSINGS) ×3 IMPLANT
GLOVE BIO SURGEON STRL SZ8 (GLOVE) ×6 IMPLANT
GLOVE INDICATOR 8.0 STRL GRN (GLOVE) ×3 IMPLANT
GOWN STRL REUS W/ TWL LRG LVL3 (GOWN DISPOSABLE) ×1 IMPLANT
GOWN STRL REUS W/ TWL XL LVL3 (GOWN DISPOSABLE) ×1 IMPLANT
GOWN STRL REUS W/TWL LRG LVL3 (GOWN DISPOSABLE) ×2
GOWN STRL REUS W/TWL XL LVL3 (GOWN DISPOSABLE) ×2
IV LACTATED RINGER IRRG 3000ML (IV SOLUTION) ×4
IV LR IRRIG 3000ML ARTHROMATIC (IV SOLUTION) ×2 IMPLANT
KIT ROOM TURNOVER OR (KITS) ×3 IMPLANT
MANIFOLD 4PT FOR NEPTUNE1 (MISCELLANEOUS) ×3 IMPLANT
NEEDLE HYPO 21X1.5 SAFETY (NEEDLE) ×6 IMPLANT
PACK ARTHROSCOPY KNEE (MISCELLANEOUS) ×3 IMPLANT
STRAP BODY AND KNEE 60X3 (MISCELLANEOUS) ×3 IMPLANT
SUT PROLENE 4 0 PS 2 18 (SUTURE) ×3 IMPLANT
SUT VIC AB 2-0 CT1 27 (SUTURE)
SUT VIC AB 2-0 CT1 TAPERPNT 27 (SUTURE) IMPLANT
SYR 50ML LL SCALE MARK (SYRINGE) ×3 IMPLANT
TUBING ARTHRO INFLOW-ONLY STRL (TUBING) ×3 IMPLANT
WAND HAND CNTRL MULTIVAC 90 (MISCELLANEOUS) IMPLANT

## 2017-06-19 NOTE — Anesthesia Preprocedure Evaluation (Deleted)
Pt presented to [facility] for knee arthroscopy today.  He had syncopal episode about three weeks ago where he fell when getting up from bed.  Says he felt disoriented and hit the ground "hard" on his knees.  His wife helped him get into bed.  In addition, his BP today is 190's/90's (checked on multiple occasions).  I explained to the patient and his family that based on his history of syncope and elevated BP, that he needs medical clearance prior to the procedure.  Dr. Roland Rack aware.

## 2017-06-19 NOTE — Discharge Summary (Signed)
On assessment patient's blood pressure noted to be in the 284'X systolic. Patient also admits that he has passed out in the last few weeks and did not follow up with a physician. MD Scouras talked with MD Poggi and decision was made to postpone surgery until patient has proper workup.

## 2017-06-19 NOTE — Anesthesia Preprocedure Evaluation (Deleted)
Anesthesia Evaluation  Patient identified by MRN, date of birth, ID band Patient awake    Reviewed: Allergy & Precautions, NPO status , Patient's Chart, lab work & pertinent test results  History of Anesthesia Complications Negative for: history of anesthetic complications  Airway Mallampati: III  TM Distance: >3 FB Neck ROM: Full    Dental no notable dental hx.    Pulmonary sleep apnea and Continuous Positive Airway Pressure Ventilation , neg COPD,    breath sounds clear to auscultation- rhonchi (-) wheezing      Cardiovascular Exercise Tolerance: Good hypertension, Pt. on medications (-) CAD, (-) Past MI and (-) Cardiac Stents  Rhythm:Regular Rate:Normal - Systolic murmurs and - Diastolic murmurs    Neuro/Psych negative neurological ROS  negative psych ROS   GI/Hepatic negative GI ROS, Neg liver ROS,   Endo/Other  negative endocrine ROSneg diabetes  Renal/GU Renal disease     Musculoskeletal  (+) Arthritis ,   Abdominal (+) + obese,   Peds  Hematology negative hematology ROS (+)   Anesthesia Other Findings Past Medical History: No date: Arthritis No date: Cancer ([facility])     Comment:  Skin Cancer No date: Chronic kidney disease No date: Hypertension No date: Occasional tremors No date: Sleep apnea     Comment:  OSA---USE C-PAP No date: Tinnitus of both ears   Reproductive/Obstetrics                             Anesthesia Physical  Anesthesia Plan  ASA: II  Anesthesia Plan: General LMA   Post-op Pain Management:    Induction: Intravenous  PONV Risk Score and Plan: 1 and Ondansetron and Dexamethasone  Airway Management Planned: LMA  Additional Equipment:   Intra-op Plan:   Post-operative Plan:   Informed Consent: I have reviewed the patients History and Physical, chart, labs and discussed the procedure including the risks, benefits and alternatives for the proposed  anesthesia with the patient or authorized representative who has indicated his/her understanding and acceptance.   Dental advisory given  Plan Discussed with: CRNA and Anesthesiologist  Anesthesia Plan Comments:         Anesthesia Quick Evaluation

## 2017-06-20 DIAGNOSIS — R001 Bradycardia, unspecified: Secondary | ICD-10-CM | POA: Insufficient documentation

## 2017-06-24 ENCOUNTER — Encounter: Payer: Self-pay | Admitting: *Deleted

## 2017-06-26 ENCOUNTER — Encounter
Admission: RE | Disposition: A | Discharge status: Home / Self Care | Payer: Self-pay | Source: Ambulatory Visit | Attending: Surgery

## 2017-06-26 ENCOUNTER — Ambulatory Visit: Payer: Medicare PPO | Admitting: Anesthesiology

## 2017-06-26 ENCOUNTER — Ambulatory Visit
Admission: RE | Admit: 2017-06-26 | Discharge: 2017-06-26 | Disposition: A | Discharge status: Home / Self Care | Payer: Medicare PPO | Source: Ambulatory Visit | Attending: Surgery | Admitting: Surgery

## 2017-06-26 DIAGNOSIS — Z7982 Long term (current) use of aspirin: Secondary | ICD-10-CM | POA: Diagnosis not present

## 2017-06-26 DIAGNOSIS — I10 Essential (primary) hypertension: Secondary | ICD-10-CM | POA: Insufficient documentation

## 2017-06-26 DIAGNOSIS — Z79899 Other long term (current) drug therapy: Secondary | ICD-10-CM | POA: Insufficient documentation

## 2017-06-26 DIAGNOSIS — G4730 Sleep apnea, unspecified: Secondary | ICD-10-CM | POA: Diagnosis not present

## 2017-06-26 DIAGNOSIS — M1711 Unilateral primary osteoarthritis, right knee: Secondary | ICD-10-CM | POA: Diagnosis not present

## 2017-06-26 DIAGNOSIS — Z9989 Dependence on other enabling machines and devices: Secondary | ICD-10-CM | POA: Insufficient documentation

## 2017-06-26 DIAGNOSIS — M2341 Loose body in knee, right knee: Secondary | ICD-10-CM | POA: Diagnosis not present

## 2017-06-26 DIAGNOSIS — M25761 Osteophyte, right knee: Secondary | ICD-10-CM | POA: Diagnosis not present

## 2017-06-26 DIAGNOSIS — M25561 Pain in right knee: Secondary | ICD-10-CM | POA: Diagnosis present

## 2017-06-26 HISTORY — PX: KNEE ARTHROSCOPY: SHX127

## 2017-06-26 SURGERY — ARTHROSCOPY, KNEE
Anesthesia: General | Laterality: Right | Wound class: Clean

## 2017-06-26 MED ORDER — METOCLOPRAMIDE HCL 5 MG/ML IJ SOLN: 5.0000 mg | Freq: Three times a day (TID) | INTRAMUSCULAR | Status: DC | PRN

## 2017-06-26 MED ORDER — GLYCOPYRROLATE 0.2 MG/ML IJ SOLN
INTRAMUSCULAR | Status: DC | PRN
  Administered 2017-06-26: .2 mg via INTRAVENOUS
  Administered 2017-06-26: 0.2 mg via INTRAVENOUS

## 2017-06-26 MED ORDER — LIDOCAINE-EPINEPHRINE 1 %-1:100000 IJ SOLN
INTRAMUSCULAR | Status: DC | PRN
Start: 2017-06-26 — End: 2017-06-26
  Administered 2017-06-26: 20 mL

## 2017-06-26 MED ORDER — CEFAZOLIN SODIUM-DEXTROSE 2-4 GM/100ML-% IV SOLN
2.0000 g | Freq: Once | INTRAVENOUS | Status: AC
  Administered 2017-06-26: 2 g via INTRAVENOUS

## 2017-06-26 MED ORDER — PROPOFOL 10 MG/ML IV BOLUS
INTRAVENOUS | Status: DC | PRN
  Administered 2017-06-26: 80 mg via INTRAVENOUS

## 2017-06-26 MED ORDER — DEXAMETHASONE SODIUM PHOSPHATE 4 MG/ML IJ SOLN
INTRAMUSCULAR | Status: DC | PRN
  Administered 2017-06-26: 8 mg via INTRAVENOUS

## 2017-06-26 MED ORDER — ACETAMINOPHEN 325 MG PO TABS: 325.0000 mg | ORAL_TABLET | ORAL | Status: DC | PRN

## 2017-06-26 MED ORDER — OXYCODONE HCL 5 MG/5ML PO SOLN: 5.0000 mg | Freq: Once | ORAL | Status: DC | PRN

## 2017-06-26 MED ORDER — OXYCODONE HCL 5 MG PO TABS: 5.0000 mg | ORAL_TABLET | Freq: Once | ORAL | Status: DC | PRN

## 2017-06-26 MED ORDER — HYDROCODONE-ACETAMINOPHEN 5-325 MG PO TABS: 1.0000 | ORAL_TABLET | ORAL | Status: DC | PRN

## 2017-06-26 MED ORDER — METOCLOPRAMIDE HCL 5 MG PO TABS: 5.0000 mg | ORAL_TABLET | Freq: Three times a day (TID) | ORAL | Status: DC | PRN

## 2017-06-26 MED ORDER — LIDOCAINE HCL (CARDIAC) 20 MG/ML IV SOLN
INTRAVENOUS | Status: DC | PRN
  Administered 2017-06-26: 50 mg via INTRATRACHEAL

## 2017-06-26 MED ORDER — LACTATED RINGERS IV SOLN
10.0000 mL/h | INTRAVENOUS | Status: DC
  Administered 2017-06-26: 10 mL/h via INTRAVENOUS

## 2017-06-26 MED ORDER — POTASSIUM CHLORIDE IN NACL 20-0.9 MEQ/L-% IV SOLN: INTRAVENOUS | Status: DC

## 2017-06-26 MED ORDER — FENTANYL CITRATE (PF) 100 MCG/2ML IJ SOLN: 25.0000 ug | INTRAMUSCULAR | Status: DC | PRN

## 2017-06-26 MED ORDER — ONDANSETRON HCL 4 MG/2ML IJ SOLN
INTRAMUSCULAR | Status: DC | PRN
  Administered 2017-06-26: 4 mg via INTRAVENOUS

## 2017-06-26 MED ORDER — ONDANSETRON HCL 4 MG/2ML IJ SOLN: 4.0000 mg | Freq: Four times a day (QID) | INTRAMUSCULAR | Status: DC | PRN

## 2017-06-26 MED ORDER — LIDOCAINE HCL 1 % IJ SOLN
INTRAMUSCULAR | Status: DC | PRN
  Administered 2017-06-26: 60 mL via INTRAMUSCULAR

## 2017-06-26 MED ORDER — FENTANYL CITRATE (PF) 100 MCG/2ML IJ SOLN
INTRAMUSCULAR | Status: DC | PRN
  Administered 2017-06-26: 50 ug via INTRAVENOUS

## 2017-06-26 MED ORDER — ONDANSETRON HCL 4 MG PO TABS: 4.0000 mg | ORAL_TABLET | Freq: Four times a day (QID) | ORAL | Status: DC | PRN

## 2017-06-26 MED ORDER — ACETAMINOPHEN 160 MG/5ML PO SOLN: 325.0000 mg | ORAL | Status: DC | PRN

## 2017-06-26 MED ORDER — MIDAZOLAM HCL 5 MG/5ML IJ SOLN
INTRAMUSCULAR | Status: DC | PRN
  Administered 2017-06-26: 2 mg via INTRAVENOUS

## 2017-06-26 SURGICAL SUPPLY — 22 items
BANDAGE ELASTIC 6 LF NS (GAUZE/BANDAGES/DRESSINGS) ×3 IMPLANT
BLADE FULL RADIUS 3.5 (BLADE) ×3 IMPLANT
CHLORAPREP W/TINT 26ML (MISCELLANEOUS) ×3 IMPLANT
COVER LIGHT HANDLE UNIVERSAL (MISCELLANEOUS) ×6 IMPLANT
DRAPE IMP U-DRAPE 54X76 (DRAPES) ×3 IMPLANT
GAUZE SPONGE 4X4 12PLY STRL (GAUZE/BANDAGES/DRESSINGS) ×3 IMPLANT
GLOVE BIO SURGEON STRL SZ8 (GLOVE) ×6 IMPLANT
GLOVE INDICATOR 8.0 STRL GRN (GLOVE) ×3 IMPLANT
GOWN STRL REUS W/ TWL LRG LVL3 (GOWN DISPOSABLE) ×1 IMPLANT
GOWN STRL REUS W/ TWL XL LVL3 (GOWN DISPOSABLE) ×1 IMPLANT
GOWN STRL REUS W/TWL LRG LVL3 (GOWN DISPOSABLE) ×2
GOWN STRL REUS W/TWL XL LVL3 (GOWN DISPOSABLE) ×2
IV LACTATED RINGER IRRG 3000ML (IV SOLUTION) ×4
IV LR IRRIG 3000ML ARTHROMATIC (IV SOLUTION) ×2 IMPLANT
KIT ROOM TURNOVER OR (KITS) ×3 IMPLANT
MANIFOLD 4PT FOR NEPTUNE1 (MISCELLANEOUS) ×3 IMPLANT
NEEDLE HYPO 21X1.5 SAFETY (NEEDLE) ×6 IMPLANT
PACK ARTHROSCOPY KNEE (MISCELLANEOUS) ×3 IMPLANT
STRAP BODY AND KNEE 60X3 (MISCELLANEOUS) ×3 IMPLANT
SUT PROLENE 4 0 PS 2 18 (SUTURE) ×3 IMPLANT
SYR 50ML LL SCALE MARK (SYRINGE) ×3 IMPLANT
TUBING ARTHRO INFLOW-ONLY STRL (TUBING) ×3 IMPLANT

## 2017-06-26 NOTE — Discharge Instructions (Addendum)
General Anesthesia, Adult, Care After These instructions provide you with information about caring for yourself after your procedure. Your health care provider may also give you more specific instructions. Your treatment has been planned according to current medical practices, but problems sometimes occur. Call your health care provider if you have any problems or questions after your procedure. What can I expect after the procedure? After the procedure, it is common to have:  Vomiting.  A sore throat.  Mental slowness.  It is common to feel:  Nauseous.  Cold or shivery.  Sleepy.  Tired.  Sore or achy, even in parts of your body where you did not have surgery.  Follow these instructions at home: For at least 24 hours after the procedure:  Do not: ? Participate in activities where you could fall or become injured. ? Drive. ? Use heavy machinery. ? Drink alcohol. ? Take sleeping pills or medicines that cause drowsiness. ? Make important decisions or sign legal documents. ? Take care of children on your own.  Rest. Eating and drinking  If you vomit, drink water, juice, or soup when you can drink without vomiting.  Drink enough fluid to keep your urine clear or pale yellow.  Make sure you have little or no nausea before eating solid foods.  Follow the diet recommended by your health care provider. General instructions  Have a responsible adult stay with you until you are awake and alert.  Return to your normal activities as told by your health care provider. Ask your health care provider what activities are safe for you.  Take over-the-counter and prescription medicines only as told by your health care provider.  If you smoke, do not smoke without supervision.  Keep all follow-up visits as told by your health care provider. This is important. Contact a health care provider if:  You continue to have nausea or vomiting at home, and medicines are not helpful.  You  cannot drink fluids or start eating again.  You cannot urinate after 8-12 hours.  You develop a skin rash.  You have fever.  You have increasing redness at the site of your procedure. Get help right away if:  You have difficulty breathing.  You have chest pain.  You have unexpected bleeding.  You feel that you are having a life-threatening or urgent problem. This information is not intended to replace advice given to you by your health care provider. Make sure you discuss any questions you have with your health care provider. Document Released: 12/24/2000 Document Revised: 02/20/2016 Document Reviewed: 09/01/2015 Elsevier Interactive Patient Education  2018 [employer].   Keep dressing dry and intact.  May shower after dressing changed on post-op day #4 (Sunday).  Cover sutures with Band-Aids after drying off. Apply ice frequently to knee. Take ibuprofen 600 mg TID or Aleve 2 tabs BID with meals for 5-7 days, then as necessary. Take pain medication as prescribed or ES Tylenol when needed.  May weight-bear as tolerated - use crutches or walker as needed. Follow-up in 10-14 days or as scheduled.

## 2017-06-26 NOTE — Transfer of Care (Signed)
Immediate Anesthesia Transfer of Care Note  Patient: Preston Hill  Procedure(s) Performed: Procedure(s): ARTHROSCOPY KNEE WITH DEBRIDEMENT AND LOOSE BODY REMOVAL (Right)  Patient Location: PACU  Anesthesia Type: General  Level of Consciousness: awake, alert  and patient cooperative  Airway and Oxygen Therapy: Patient Spontanous Breathing and Patient connected to supplemental oxygen  Post-op Assessment: Post-op Vital signs reviewed, Patient's Cardiovascular Status Stable, Respiratory Function Stable, Patent Airway and No signs of Nausea or vomiting  Post-op Vital Signs: Reviewed and stable  Complications: No apparent anesthesia complications

## 2017-06-26 NOTE — Anesthesia Postprocedure Evaluation (Addendum)
Anesthesia Post Note  Patient: Preston Hill  Procedure(s) Performed: Procedure(s) (LRB): ARTHROSCOPY KNEE WITH DEBRIDEMENT AND LOOSE BODY REMOVAL (Right)  Patient location during evaluation: PACU Anesthesia Type: General Level of consciousness: awake and alert Pain management: pain level controlled Vital Signs Assessment: post-procedure vital signs reviewed and stable Respiratory status: spontaneous breathing, nonlabored ventilation, respiratory function stable and patient connected to nasal cannula oxygen Cardiovascular status: blood pressure returned to baseline and stable Postop Assessment: no apparent nausea or vomiting Anesthetic complications: no Comments: In OR, pt had brief, transient episode of profound bradycardia.  Other VSS. ST changes.  Treated by IV glycopyrolate, in the OR.  In PACU, pt A&O.  VSS. Denies CP or SOB.  Informed pt to f/u with cardiologist regarding bradycardia.    Fidel Levy

## 2017-06-26 NOTE — Anesthesia Preprocedure Evaluation (Signed)
Anesthesia Evaluation  Patient identified by MRN, date of birth, ID band  Reviewed: NPO status   History of Anesthesia Complications Negative for: history of anesthetic complications  Airway Mallampati: II  TM Distance: >3 FB Neck ROM: full    Dental no notable dental hx.    Pulmonary sleep apnea and Continuous Positive Airway Pressure Ventilation ,    Pulmonary exam normal        Cardiovascular Exercise Tolerance: Good hypertension, Normal cardiovascular exam     Neuro/Psych Hand tremors negative neurological ROS  negative psych ROS   GI/Hepatic negative GI ROS, Neg liver ROS,   Endo/Other  negative endocrine ROS  Renal/GU negative Renal ROS   bph    Musculoskeletal  (+) Arthritis ,   Abdominal   Peds  Hematology negative hematology ROS (+)   Anesthesia Other Findings ekg: Sinus bradycardia with 1st degree A-V block;  stress echo: 2015: wnl;  cards cleared: 06/2017: Dr. Nehemiah Massed;       Reproductive/Obstetrics                             Anesthesia Physical Anesthesia Plan  ASA: II  Anesthesia Plan: General   Post-op Pain Management:    Induction:   PONV Risk Score and Plan:   Airway Management Planned:   Additional Equipment:   Intra-op Plan:   Post-operative Plan:   Informed Consent: I have reviewed the patients History and Physical, chart, labs and discussed the procedure including the risks, benefits and alternatives for the proposed anesthesia with the patient or authorized representative who has indicated his/her understanding and acceptance.     Plan Discussed with: CRNA  Anesthesia Plan Comments:         Anesthesia Quick Evaluation

## 2017-06-26 NOTE — Anesthesia Procedure Notes (Signed)
Procedure Name: LMA Insertion Date/Time: 06/26/2017 12:51 PM Performed by: Londell Moh Pre-anesthesia Checklist: Patient identified, Emergency Drugs available, Suction available, Timeout performed and Patient being monitored Patient Re-evaluated:Patient Re-evaluated prior to induction Oxygen Delivery Method: Circle system utilized Preoxygenation: Pre-oxygenation with 100% oxygen Induction Type: IV induction LMA: LMA inserted LMA Size: 4.0 Number of attempts: 1 Placement Confirmation: positive ETCO2 and breath sounds checked- equal and bilateral Tube secured with: Tape

## 2017-06-26 NOTE — Op Note (Signed)
06/26/2017  2:28 PM  Patient:   Preston Hill  Pre-Op Diagnosis:   Advance primary degenerative joint disease with loose body, right knee.  Postoperative diagnosis:   Same.  Procedure:   Extensive arthroscopic debridement with abrasion chondroplasty of medial and lateral femoral condyles and excision of loose body, right knee.  Surgeon:   Pascal Lux, M.D.  Anesthesia:   General LMA.  Findings:   As above. There were extensive grade 4 chondromalacial changes involving the medial and lateral femoral condyles, especially anteriorly and in the femoral trochlea. There were grade 3-4 chondromalacial changes involving the weight bearing surfaces of the medial and lateral femoral condyles, as well as in the medial and lateral tibial plateau regions. The medial and lateral menisci both were in satisfactory condition, as were the anterior and posterior cruciate ligaments.  Complications:   None.  EBL:   5 cc.  Total fluids:   500 cc of crystalloid.  Tourniquet time:   None  Drains:   None  Closure:   4-0 Prolene interrupted sutures.  Brief clinical note:   The patient is an 83 year old male with a long history of intermittent right knee pain. Over the past several months, he is noted worsening episodes of acute pain and catching of his knee. His history and examination are consistent with advanced degenerative joint disease with a new loose body. The patient presents at this time for arthroscopy, debridement, and excision of the loose body.  Procedure:   The patient was brought into the operating room and lain in the supine position. After adequate general laryngeal mask anesthesia was obtained, a timeout was performed to verify the appropriate side. The patient's right knee was injected sterilely using a solution of 30 cc of 1% lidocaine and 30 cc of 0.5% Sensorcaine with epinephrine. The right lower extremity was prepped with ChloraPrep solution before being draped sterilely.  Preoperative antibiotics were administered. The expected portal sites were injected with 0.5% Sensorcaine with epinephrine before the camera was placed in the anterolateral portal and instrumentation performed through the anteromedial portal. The knee was sequentially examined beginning in the suprapatellar pouch, then progressing to the patellofemoral space, the medial gutter compartment, the notch, and finally the lateral compartment and gutter. The findings were as described above. Abundant reactive synovial tissues anteriorly were debrided using the full-radius resector in order to improve visualization. Areas of loose articular cartilage were debrided back to stable margins of both the medial and lateral femoral condyles. In addition, the loose body was identified and removed using small graspers. An osteophyte also was removed from the inferior pole of the patella. The instruments were removed from the joint after suctioning the excess fluid. The portal sites were closed using 4-0 Prolene interrupted sutures before a sterile bulky dressing was applied to the knee. The patient was then awakened, extubated, and returned to the recovery room in satisfactory condition after tolerating the procedure well.

## 2017-06-26 NOTE — H&P (Signed)
Paper H&P to be scanned into permanent record. H&P reviewed and patient re-examined. No changes. 

## 2017-06-27 ENCOUNTER — Encounter: Payer: Self-pay | Admitting: Surgery

## 2017-07-03 DIAGNOSIS — M2341 Loose body in knee, right knee: Secondary | ICD-10-CM | POA: Insufficient documentation

## 2017-07-03 DIAGNOSIS — M1711 Unilateral primary osteoarthritis, right knee: Secondary | ICD-10-CM | POA: Insufficient documentation

## 2017-07-09 DIAGNOSIS — I440 Atrioventricular block, first degree: Secondary | ICD-10-CM | POA: Insufficient documentation

## 2017-11-06 DIAGNOSIS — R251 Tremor, unspecified: Secondary | ICD-10-CM | POA: Insufficient documentation

## 2018-03-31 DIAGNOSIS — S46102A Unspecified injury of muscle, fascia and tendon of long head of biceps, left arm, initial encounter: Secondary | ICD-10-CM | POA: Insufficient documentation

## 2018-05-14 DIAGNOSIS — R600 Localized edema: Secondary | ICD-10-CM | POA: Insufficient documentation

## 2018-05-20 DIAGNOSIS — Z85828 Personal history of other malignant neoplasm of skin: Secondary | ICD-10-CM | POA: Insufficient documentation

## 2019-02-21 ENCOUNTER — Encounter (HOSPITAL_COMMUNITY)
Admission: AD | Disposition: A | Discharge status: Rehabilitation Facility | Payer: Self-pay | Source: Other Acute Inpatient Hospital | Attending: Neurosurgery

## 2019-02-21 ENCOUNTER — Inpatient Hospital Stay (HOSPITAL_COMMUNITY): Payer: Medicare HMO | Admitting: Anesthesiology

## 2019-02-21 ENCOUNTER — Emergency Department: Payer: Medicare HMO

## 2019-02-21 ENCOUNTER — Emergency Department
Admission: EM | Admit: 2019-02-21 | Discharge: 2019-02-21 | Disposition: A | Discharge status: Other Acute Inpatient Hospital | Payer: Medicare HMO | Attending: Emergency Medicine | Admitting: Emergency Medicine

## 2019-02-21 ENCOUNTER — Encounter: Payer: Self-pay | Admitting: Emergency Medicine

## 2019-02-21 ENCOUNTER — Encounter (HOSPITAL_COMMUNITY): Payer: Self-pay | Admitting: Student

## 2019-02-21 ENCOUNTER — Inpatient Hospital Stay (HOSPITAL_COMMUNITY)
Admission: AD | Admit: 2019-02-21 | Discharge: 2019-03-02 | DRG: 026 | Disposition: A | Discharge status: Rehabilitation Facility | Payer: Medicare HMO | Source: Other Acute Inpatient Hospital | Attending: Neurosurgery | Admitting: Neurosurgery

## 2019-02-21 ENCOUNTER — Other Ambulatory Visit: Payer: Self-pay

## 2019-02-21 DIAGNOSIS — N400 Enlarged prostate without lower urinary tract symptoms: Secondary | ICD-10-CM | POA: Diagnosis present

## 2019-02-21 DIAGNOSIS — R252 Cramp and spasm: Secondary | ICD-10-CM | POA: Diagnosis not present

## 2019-02-21 DIAGNOSIS — Z8249 Family history of ischemic heart disease and other diseases of the circulatory system: Secondary | ICD-10-CM | POA: Diagnosis not present

## 2019-02-21 DIAGNOSIS — N189 Chronic kidney disease, unspecified: Secondary | ICD-10-CM | POA: Diagnosis not present

## 2019-02-21 DIAGNOSIS — S069X0A Unspecified intracranial injury without loss of consciousness, initial encounter: Secondary | ICD-10-CM | POA: Diagnosis not present

## 2019-02-21 DIAGNOSIS — S065XAA Traumatic subdural hemorrhage with loss of consciousness status unknown, initial encounter: Secondary | ICD-10-CM

## 2019-02-21 DIAGNOSIS — R29810 Facial weakness: Secondary | ICD-10-CM | POA: Diagnosis present

## 2019-02-21 DIAGNOSIS — Z1159 Encounter for screening for other viral diseases: Secondary | ICD-10-CM

## 2019-02-21 DIAGNOSIS — R569 Unspecified convulsions: Secondary | ICD-10-CM | POA: Diagnosis not present

## 2019-02-21 DIAGNOSIS — E46 Unspecified protein-calorie malnutrition: Secondary | ICD-10-CM | POA: Diagnosis not present

## 2019-02-21 DIAGNOSIS — E876 Hypokalemia: Secondary | ICD-10-CM | POA: Diagnosis present

## 2019-02-21 DIAGNOSIS — I69298 Other sequelae of other nontraumatic intracranial hemorrhage: Secondary | ICD-10-CM | POA: Diagnosis not present

## 2019-02-21 DIAGNOSIS — I6203 Nontraumatic chronic subdural hemorrhage: Principal | ICD-10-CM | POA: Diagnosis present

## 2019-02-21 DIAGNOSIS — R739 Hyperglycemia, unspecified: Secondary | ICD-10-CM | POA: Diagnosis not present

## 2019-02-21 DIAGNOSIS — Z9841 Cataract extraction status, right eye: Secondary | ICD-10-CM | POA: Diagnosis not present

## 2019-02-21 DIAGNOSIS — Z820 Family history of epilepsy and other diseases of the nervous system: Secondary | ICD-10-CM | POA: Diagnosis not present

## 2019-02-21 DIAGNOSIS — I129 Hypertensive chronic kidney disease with stage 1 through stage 4 chronic kidney disease, or unspecified chronic kidney disease: Secondary | ICD-10-CM | POA: Insufficient documentation

## 2019-02-21 DIAGNOSIS — Y929 Unspecified place or not applicable: Secondary | ICD-10-CM | POA: Diagnosis not present

## 2019-02-21 DIAGNOSIS — Y999 Unspecified external cause status: Secondary | ICD-10-CM | POA: Insufficient documentation

## 2019-02-21 DIAGNOSIS — M1990 Unspecified osteoarthritis, unspecified site: Secondary | ICD-10-CM | POA: Diagnosis present

## 2019-02-21 DIAGNOSIS — R0989 Other specified symptoms and signs involving the circulatory and respiratory systems: Secondary | ICD-10-CM | POA: Diagnosis not present

## 2019-02-21 DIAGNOSIS — M6281 Muscle weakness (generalized): Secondary | ICD-10-CM | POA: Diagnosis not present

## 2019-02-21 DIAGNOSIS — Y939 Activity, unspecified: Secondary | ICD-10-CM | POA: Insufficient documentation

## 2019-02-21 DIAGNOSIS — S065X9A Traumatic subdural hemorrhage with loss of consciousness of unspecified duration, initial encounter: Secondary | ICD-10-CM | POA: Insufficient documentation

## 2019-02-21 DIAGNOSIS — I609 Nontraumatic subarachnoid hemorrhage, unspecified: Secondary | ICD-10-CM | POA: Diagnosis present

## 2019-02-21 DIAGNOSIS — H9313 Tinnitus, bilateral: Secondary | ICD-10-CM | POA: Diagnosis present

## 2019-02-21 DIAGNOSIS — G9349 Other encephalopathy: Secondary | ICD-10-CM | POA: Diagnosis not present

## 2019-02-21 DIAGNOSIS — Z7982 Long term (current) use of aspirin: Secondary | ICD-10-CM | POA: Diagnosis not present

## 2019-02-21 DIAGNOSIS — R269 Unspecified abnormalities of gait and mobility: Secondary | ICD-10-CM | POA: Diagnosis not present

## 2019-02-21 DIAGNOSIS — I440 Atrioventricular block, first degree: Secondary | ICD-10-CM | POA: Diagnosis present

## 2019-02-21 DIAGNOSIS — Z85828 Personal history of other malignant neoplasm of skin: Secondary | ICD-10-CM | POA: Insufficient documentation

## 2019-02-21 DIAGNOSIS — Z791 Long term (current) use of non-steroidal anti-inflammatories (NSAID): Secondary | ICD-10-CM

## 2019-02-21 DIAGNOSIS — I4891 Unspecified atrial fibrillation: Secondary | ICD-10-CM | POA: Diagnosis not present

## 2019-02-21 DIAGNOSIS — Z885 Allergy status to narcotic agent status: Secondary | ICD-10-CM

## 2019-02-21 DIAGNOSIS — G4733 Obstructive sleep apnea (adult) (pediatric): Secondary | ICD-10-CM | POA: Diagnosis present

## 2019-02-21 DIAGNOSIS — R0902 Hypoxemia: Secondary | ICD-10-CM | POA: Diagnosis not present

## 2019-02-21 DIAGNOSIS — H9190 Unspecified hearing loss, unspecified ear: Secondary | ICD-10-CM | POA: Diagnosis present

## 2019-02-21 DIAGNOSIS — D62 Acute posthemorrhagic anemia: Secondary | ICD-10-CM | POA: Diagnosis not present

## 2019-02-21 DIAGNOSIS — G9340 Encephalopathy, unspecified: Secondary | ICD-10-CM | POA: Diagnosis not present

## 2019-02-21 DIAGNOSIS — R509 Fever, unspecified: Secondary | ICD-10-CM | POA: Diagnosis not present

## 2019-02-21 DIAGNOSIS — Z79899 Other long term (current) drug therapy: Secondary | ICD-10-CM | POA: Diagnosis not present

## 2019-02-21 DIAGNOSIS — R2689 Other abnormalities of gait and mobility: Secondary | ICD-10-CM | POA: Diagnosis present

## 2019-02-21 DIAGNOSIS — Z961 Presence of intraocular lens: Secondary | ICD-10-CM | POA: Diagnosis present

## 2019-02-21 DIAGNOSIS — Y33XXXA Other specified events, undetermined intent, initial encounter: Secondary | ICD-10-CM | POA: Insufficient documentation

## 2019-02-21 DIAGNOSIS — I69251 Hemiplegia and hemiparesis following other nontraumatic intracranial hemorrhage affecting right dominant side: Secondary | ICD-10-CM | POA: Diagnosis not present

## 2019-02-21 DIAGNOSIS — Z9981 Dependence on supplemental oxygen: Secondary | ICD-10-CM | POA: Diagnosis not present

## 2019-02-21 DIAGNOSIS — I10 Essential (primary) hypertension: Secondary | ICD-10-CM | POA: Diagnosis not present

## 2019-02-21 DIAGNOSIS — E785 Hyperlipidemia, unspecified: Secondary | ICD-10-CM | POA: Diagnosis present

## 2019-02-21 DIAGNOSIS — R4189 Other symptoms and signs involving cognitive functions and awareness: Secondary | ICD-10-CM | POA: Diagnosis not present

## 2019-02-21 DIAGNOSIS — R7303 Prediabetes: Secondary | ICD-10-CM | POA: Diagnosis not present

## 2019-02-21 DIAGNOSIS — R7309 Other abnormal glucose: Secondary | ICD-10-CM | POA: Diagnosis not present

## 2019-02-21 DIAGNOSIS — S065X0S Traumatic subdural hemorrhage without loss of consciousness, sequela: Secondary | ICD-10-CM | POA: Diagnosis not present

## 2019-02-21 HISTORY — DX: Traumatic subdural hemorrhage with loss of consciousness of unspecified duration, initial encounter: S06.5X9A

## 2019-02-21 HISTORY — DX: Localized edema: R60.0

## 2019-02-21 HISTORY — DX: Benign prostatic hyperplasia without lower urinary tract symptoms: N40.0

## 2019-02-21 HISTORY — PX: BURR HOLE: SHX908

## 2019-02-21 HISTORY — DX: Edema, unspecified: R60.9

## 2019-02-21 HISTORY — DX: Traumatic subdural hemorrhage with loss of consciousness status unknown, initial encounter: S06.5XAA

## 2019-02-21 LAB — COMPREHENSIVE METABOLIC PANEL
ALT: 15 U/L (ref 0–44)
AST: 18 U/L (ref 15–41)
Albumin: 4.2 g/dL (ref 3.5–5.0)
Alkaline Phosphatase: 39 U/L (ref 38–126)
Anion gap: 11 (ref 5–15)
BUN: 20 mg/dL (ref 8–23)
CO2: 24 mmol/L (ref 22–32)
Calcium: 9.3 mg/dL (ref 8.9–10.3)
Chloride: 104 mmol/L (ref 98–111)
Creatinine, Ser: 0.88 mg/dL (ref 0.61–1.24)
GFR calc Af Amer: >60 mL/min (ref >60)
GFR calc non Af Amer: >60 mL/min (ref >60)
Glucose, Bld: 108 mg/dL — ABNORMAL HIGH (ref 70–99)
Potassium: 3.8 mmol/L (ref 3.5–5.1)
Sodium: 139 mmol/L (ref 135–145)
Total Bilirubin: 0.8 mg/dL (ref 0.3–1.2)
Total Protein: 6.8 g/dL (ref 6.5–8.1)

## 2019-02-21 LAB — DIFFERENTIAL
Abs Immature Granulocytes: 0.01 10*3/uL (ref 0.00–0.07)
Basophils Absolute: 0 10*3/uL (ref 0.0–0.1)
Basophils Relative: 1 %
Eosinophils Absolute: 0 10*3/uL (ref 0.0–0.5)
Eosinophils Relative: 1 %
Immature Granulocytes: 0 %
Lymphocytes Relative: 26 %
Lymphs Abs: 1.4 10*3/uL (ref 0.7–4.0)
Monocytes Absolute: 0.6 10*3/uL (ref 0.1–1.0)
Monocytes Relative: 11 %
Neutro Abs: 3.3 10*3/uL (ref 1.7–7.7)
Neutrophils Relative %: 61 %

## 2019-02-21 LAB — CBC
HCT: 36.9 % — ABNORMAL LOW (ref 39.0–52.0)
Hemoglobin: 12.9 g/dL — ABNORMAL LOW (ref 13.0–17.0)
MCH: 30.9 pg (ref 26.0–34.0)
MCHC: 35 g/dL (ref 30.0–36.0)
MCV: 88.3 fL (ref 80.0–100.0)
Platelets: 171 10*3/uL (ref 150–400)
RBC: 4.18 MIL/uL — ABNORMAL LOW (ref 4.22–5.81)
RDW: 12.4 % (ref 11.5–15.5)
WBC: 5.3 10*3/uL (ref 4.0–10.5)
nRBC: 0 % (ref 0.0–0.2)

## 2019-02-21 LAB — PROTIME-INR
INR: 1 (ref 0.8–1.2)
Prothrombin Time: 13 seconds (ref 11.4–15.2)

## 2019-02-21 LAB — SARS CORONAVIRUS 2 BY RT PCR (HOSPITAL ORDER, PERFORMED IN ~~LOC~~ HOSPITAL LAB): SARS Coronavirus 2: NEGATIVE

## 2019-02-21 LAB — APTT: aPTT: 32 seconds (ref 24–36)

## 2019-02-21 LAB — MRSA PCR SCREENING: MRSA by PCR: NEGATIVE

## 2019-02-21 SURGERY — CREATION, CRANIAL BURR HOLE
Anesthesia: General | Site: Head | Laterality: Left

## 2019-02-21 MED ORDER — CEFAZOLIN SODIUM-DEXTROSE 1-4 GM/50ML-% IV SOLN
1.0000 g | Freq: Three times a day (TID) | INTRAVENOUS | Status: AC
  Administered 2019-02-22 (×2): 1 g via INTRAVENOUS
  Filled 2019-02-21 (×2): qty 50

## 2019-02-21 MED ORDER — CEFAZOLIN SODIUM-DEXTROSE 2-4 GM/100ML-% IV SOLN
2.0000 g | INTRAVENOUS | Status: AC
  Administered 2019-02-21: 20:00:00 2 g via INTRAVENOUS

## 2019-02-21 MED ORDER — ONDANSETRON HCL 4 MG/2ML IJ SOLN: 4.0000 mg | INTRAMUSCULAR | Status: DC | PRN

## 2019-02-21 MED ORDER — DEXAMETHASONE SODIUM PHOSPHATE 10 MG/ML IJ SOLN
INTRAMUSCULAR | Status: DC | PRN
  Administered 2019-02-21: 5 mg via INTRAVENOUS

## 2019-02-21 MED ORDER — POTASSIUM CHLORIDE CRYS ER 20 MEQ PO TBCR: 20.0000 meq | EXTENDED_RELEASE_TABLET | Freq: Two times a day (BID) | ORAL | Status: DC

## 2019-02-21 MED ORDER — PROMETHAZINE HCL 25 MG PO TABS: 12.5000 mg | ORAL_TABLET | ORAL | Status: DC | PRN

## 2019-02-21 MED ORDER — PROPRANOLOL HCL 40 MG PO TABS
40.0000 mg | ORAL_TABLET | Freq: Two times a day (BID) | ORAL | Status: DC
  Filled 2019-02-21: qty 1

## 2019-02-21 MED ORDER — ALBUMIN HUMAN 5 % IV SOLN
INTRAVENOUS | Status: AC
  Administered 2019-02-21: 22:00:00 12.5 g via INTRAVENOUS
  Filled 2019-02-21: qty 500

## 2019-02-21 MED ORDER — HYDROCODONE-ACETAMINOPHEN 5-325 MG PO TABS
1.0000 | ORAL_TABLET | ORAL | Status: DC | PRN
  Administered 2019-02-25 – 2019-03-01 (×3): 1 via ORAL
  Filled 2019-02-21 (×3): qty 1

## 2019-02-21 MED ORDER — ONDANSETRON HCL 4 MG PO TABS: 4.0000 mg | ORAL_TABLET | ORAL | Status: DC | PRN

## 2019-02-21 MED ORDER — BACITRACIN ZINC 500 UNIT/GM EX OINT
TOPICAL_OINTMENT | CUTANEOUS | Status: AC
  Filled 2019-02-21: qty 28.35

## 2019-02-21 MED ORDER — THROMBIN 20000 UNITS EX SOLR
CUTANEOUS | Status: DC | PRN
  Administered 2019-02-21: 20 mL via TOPICAL

## 2019-02-21 MED ORDER — DIPHENHYDRAMINE HCL 50 MG/ML IJ SOLN
INTRAMUSCULAR | Status: DC | PRN
  Administered 2019-02-21: 12.5 mg via INTRAVENOUS

## 2019-02-21 MED ORDER — ALBUMIN HUMAN 5 % IV SOLN
INTRAVENOUS | Status: DC | PRN
  Administered 2019-02-21: 20:00:00 via INTRAVENOUS

## 2019-02-21 MED ORDER — PROPOFOL 10 MG/ML IV BOLUS
INTRAVENOUS | Status: DC | PRN
  Administered 2019-02-21: 140 mg via INTRAVENOUS

## 2019-02-21 MED ORDER — 0.9 % SODIUM CHLORIDE (POUR BTL) OPTIME
TOPICAL | Status: DC | PRN
  Administered 2019-02-21: 1000 mL

## 2019-02-21 MED ORDER — LIDOCAINE-EPINEPHRINE 1 %-1:100000 IJ SOLN
INTRAMUSCULAR | Status: DC | PRN
  Administered 2019-02-21: 10 mL via INTRADERMAL

## 2019-02-21 MED ORDER — ONDANSETRON HCL 4 MG/2ML IJ SOLN
INTRAMUSCULAR | Status: DC | PRN
  Administered 2019-02-21: 4 mg via INTRAVENOUS

## 2019-02-21 MED ORDER — HYDROMORPHONE HCL 1 MG/ML IJ SOLN
0.5000 mg | INTRAMUSCULAR | Status: DC | PRN
  Administered 2019-02-23: 0.5 mg via INTRAVENOUS
  Filled 2019-02-21: qty 1

## 2019-02-21 MED ORDER — GLUCOSAMINE SULFATE 1000 MG PO CAPS: 2000.0000 mg | ORAL_CAPSULE | Freq: Every day | ORAL | Status: DC

## 2019-02-21 MED ORDER — PROMETHAZINE HCL 25 MG/ML IJ SOLN: 6.2500 mg | INTRAMUSCULAR | Status: DC | PRN

## 2019-02-21 MED ORDER — ALBUMIN HUMAN 5 % IV SOLN
12.5000 g | Freq: Once | INTRAVENOUS | Status: AC
  Administered 2019-02-21: 22:00:00 12.5 g via INTRAVENOUS

## 2019-02-21 MED ORDER — THROMBIN 5000 UNITS EX SOLR
CUTANEOUS | Status: AC
  Filled 2019-02-21: qty 5000

## 2019-02-21 MED ORDER — ROCURONIUM BROMIDE 10 MG/ML (PF) SYRINGE
PREFILLED_SYRINGE | INTRAVENOUS | Status: AC
  Filled 2019-02-21: qty 10

## 2019-02-21 MED ORDER — ROCURONIUM BROMIDE 50 MG/5ML IV SOSY
PREFILLED_SYRINGE | INTRAVENOUS | Status: DC | PRN
  Administered 2019-02-21: 40 mg via INTRAVENOUS

## 2019-02-21 MED ORDER — PROPOFOL 10 MG/ML IV BOLUS
INTRAVENOUS | Status: AC
  Filled 2019-02-21: qty 20

## 2019-02-21 MED ORDER — SODIUM CHLORIDE 0.9 % IV SOLN
INTRAVENOUS | Status: DC
  Administered 2019-02-21 – 2019-02-25 (×4): via INTRAVENOUS

## 2019-02-21 MED ORDER — ACETAMINOPHEN 650 MG RE SUPP: 650.0000 mg | RECTAL | Status: DC | PRN

## 2019-02-21 MED ORDER — SUCCINYLCHOLINE CHLORIDE 200 MG/10ML IV SOSY
PREFILLED_SYRINGE | INTRAVENOUS | Status: AC
  Filled 2019-02-21: qty 10

## 2019-02-21 MED ORDER — ONDANSETRON HCL 4 MG/2ML IJ SOLN
INTRAMUSCULAR | Status: AC
  Filled 2019-02-21: qty 2

## 2019-02-21 MED ORDER — LIDOCAINE 2% (20 MG/ML) 5 ML SYRINGE
INTRAMUSCULAR | Status: DC | PRN
  Administered 2019-02-21: 90 mg via INTRAVENOUS

## 2019-02-21 MED ORDER — FENTANYL CITRATE (PF) 100 MCG/2ML IJ SOLN
INTRAMUSCULAR | Status: DC | PRN
  Administered 2019-02-21: 100 ug via INTRAVENOUS

## 2019-02-21 MED ORDER — LABETALOL HCL 5 MG/ML IV SOLN
10.0000 mg | INTRAVENOUS | Status: DC | PRN
  Administered 2019-02-25: 08:00:00 20 mg via INTRAVENOUS
  Administered 2019-02-28: 01:00:00 10 mg via INTRAVENOUS
  Filled 2019-02-21: qty 8
  Filled 2019-02-21 (×3): qty 4

## 2019-02-21 MED ORDER — SUCCINYLCHOLINE CHLORIDE 20 MG/ML IJ SOLN
INTRAMUSCULAR | Status: DC | PRN
  Administered 2019-02-21: 120 mg via INTRAVENOUS

## 2019-02-21 MED ORDER — EPHEDRINE 5 MG/ML INJ
INTRAVENOUS | Status: AC
  Filled 2019-02-21: qty 10

## 2019-02-21 MED ORDER — LIDOCAINE 2% (20 MG/ML) 5 ML SYRINGE
INTRAMUSCULAR | Status: AC
  Filled 2019-02-21: qty 5

## 2019-02-21 MED ORDER — EPHEDRINE SULFATE 50 MG/ML IJ SOLN
INTRAMUSCULAR | Status: DC | PRN
  Administered 2019-02-21 (×3): 10 mg via INTRAVENOUS

## 2019-02-21 MED ORDER — SODIUM CHLORIDE 0.9 % IV SOLN
INTRAVENOUS | Status: DC | PRN
  Administered 2019-02-21: 20:00:00 25 ug/min via INTRAVENOUS

## 2019-02-21 MED ORDER — DEXAMETHASONE SODIUM PHOSPHATE 10 MG/ML IJ SOLN
INTRAMUSCULAR | Status: AC
  Filled 2019-02-21: qty 1

## 2019-02-21 MED ORDER — FENTANYL CITRATE (PF) 250 MCG/5ML IJ SOLN
INTRAMUSCULAR | Status: AC
  Filled 2019-02-21: qty 5

## 2019-02-21 MED ORDER — PANTOPRAZOLE SODIUM 40 MG IV SOLR
40.0000 mg | Freq: Every day | INTRAVENOUS | Status: DC
  Administered 2019-02-21 – 2019-02-27 (×6): 40 mg via INTRAVENOUS
  Filled 2019-02-21 (×7): qty 40

## 2019-02-21 MED ORDER — AMLODIPINE BESYLATE 10 MG PO TABS: 10.0000 mg | ORAL_TABLET | Freq: Every day | ORAL | Status: DC

## 2019-02-21 MED ORDER — TAMSULOSIN HCL 0.4 MG PO CAPS: 0.8000 mg | ORAL_CAPSULE | Freq: Every day | ORAL | Status: DC

## 2019-02-21 MED ORDER — SUGAMMADEX SODIUM 200 MG/2ML IV SOLN
INTRAVENOUS | Status: DC | PRN
  Administered 2019-02-21: 320 mg via INTRAVENOUS

## 2019-02-21 MED ORDER — THROMBIN 5000 UNITS EX SOLR
OROMUCOSAL | Status: DC | PRN
  Administered 2019-02-21: 5 mL via TOPICAL

## 2019-02-21 MED ORDER — SODIUM CHLORIDE 0.9 % IV SOLN
INTRAVENOUS | Status: DC | PRN
  Administered 2019-02-21: 20:00:00 via INTRAVENOUS

## 2019-02-21 MED ORDER — FENTANYL CITRATE (PF) 100 MCG/2ML IJ SOLN: 25.0000 ug | INTRAMUSCULAR | Status: DC | PRN

## 2019-02-21 MED ORDER — LIDOCAINE-EPINEPHRINE 1 %-1:100000 IJ SOLN
INTRAMUSCULAR | Status: AC
  Filled 2019-02-21: qty 1

## 2019-02-21 MED ORDER — LISINOPRIL 20 MG PO TABS: 20.0000 mg | ORAL_TABLET | Freq: Every day | ORAL | Status: DC

## 2019-02-21 MED ORDER — VITAMIN E 180 MG (400 UNIT) PO CAPS
400.0000 [IU] | ORAL_CAPSULE | Freq: Every day | ORAL | Status: DC
  Filled 2019-02-21: qty 1

## 2019-02-21 MED ORDER — SODIUM CHLORIDE 0.9% FLUSH: 3.0000 mL | Freq: Once | INTRAVENOUS | Status: DC

## 2019-02-21 MED ORDER — ACETAMINOPHEN 325 MG PO TABS
650.0000 mg | ORAL_TABLET | ORAL | Status: DC | PRN
  Administered 2019-02-26: 650 mg via ORAL
  Filled 2019-02-21: qty 2

## 2019-02-21 MED ORDER — THROMBIN 20000 UNITS EX KIT
PACK | CUTANEOUS | Status: AC
  Filled 2019-02-21: qty 1

## 2019-02-21 MED ORDER — LEVETIRACETAM IN NACL 500 MG/100ML IV SOLN
500.0000 mg | Freq: Two times a day (BID) | INTRAVENOUS | Status: DC
  Administered 2019-02-21 – 2019-02-22 (×2): 500 mg via INTRAVENOUS
  Filled 2019-02-21 (×3): qty 100

## 2019-02-21 MED ORDER — DIPHENHYDRAMINE HCL 50 MG/ML IJ SOLN
INTRAMUSCULAR | Status: AC
  Filled 2019-02-21: qty 1

## 2019-02-21 SURGICAL SUPPLY — 58 items
BAG DECANTER FOR FLEXI CONT (MISCELLANEOUS) ×3 IMPLANT
BANDAGE GAUZE 4  KLING STR (GAUZE/BANDAGES/DRESSINGS) IMPLANT
BLADE CLIPPER SURG (BLADE) IMPLANT
BLADE SURG 11 STRL SS (BLADE) ×3 IMPLANT
BNDG COHESIVE 4X5 TAN NS LF (GAUZE/BANDAGES/DRESSINGS) IMPLANT
BUR ACORN 6.0 (BURR) ×2 IMPLANT
BUR ACORN 6.0MM (BURR) ×1
CABLE BIPOLOR RESECTION CORD (MISCELLANEOUS) ×3 IMPLANT
CANISTER SUCT 3000ML PPV (MISCELLANEOUS) ×3 IMPLANT
CARTRIDGE OIL MAESTRO DRILL (MISCELLANEOUS) ×1 IMPLANT
CLIP VESOCCLUDE MED 6/CT (CLIP) IMPLANT
COVER WAND RF STERILE (DRAPES) ×3 IMPLANT
DECANTER SPIKE VIAL GLASS SM (MISCELLANEOUS) ×3 IMPLANT
DERMABOND ADVANCED (GAUZE/BANDAGES/DRESSINGS) ×2
DERMABOND ADVANCED .7 DNX12 (GAUZE/BANDAGES/DRESSINGS) ×1 IMPLANT
DIFFUSER DRILL AIR PNEUMATIC (MISCELLANEOUS) ×3 IMPLANT
DRAPE NEUROLOGICAL W/INCISE (DRAPES) IMPLANT
DRAPE WARM FLUID 44X44 (DRAPE) ×3 IMPLANT
ELECT CAUTERY BLADE 6.4 (BLADE) ×3 IMPLANT
ELECT REM PT RETURN 9FT ADLT (ELECTROSURGICAL) ×3
ELECTRODE REM PT RTRN 9FT ADLT (ELECTROSURGICAL) ×1 IMPLANT
GAUZE 4X4 16PLY RFD (DISPOSABLE) IMPLANT
GAUZE SPONGE 4X4 12PLY STRL (GAUZE/BANDAGES/DRESSINGS) ×3 IMPLANT
GLOVE BIO SURGEON STRL SZ 6.5 (GLOVE) ×2 IMPLANT
GLOVE BIO SURGEON STRL SZ7 (GLOVE) ×3 IMPLANT
GLOVE BIO SURGEONS STRL SZ 6.5 (GLOVE) ×1
GLOVE ECLIPSE 9.0 STRL (GLOVE) ×6 IMPLANT
GLOVE EXAM NITRILE XL STR (GLOVE) IMPLANT
GOWN STRL REUS W/ TWL LRG LVL3 (GOWN DISPOSABLE) ×1 IMPLANT
GOWN STRL REUS W/ TWL XL LVL3 (GOWN DISPOSABLE) ×1 IMPLANT
GOWN STRL REUS W/TWL 2XL LVL3 (GOWN DISPOSABLE) ×3 IMPLANT
GOWN STRL REUS W/TWL LRG LVL3 (GOWN DISPOSABLE) ×2
GOWN STRL REUS W/TWL XL LVL3 (GOWN DISPOSABLE) ×2
HEMOSTAT SURGICEL 2X14 (HEMOSTASIS) IMPLANT
HOOK DURA (MISCELLANEOUS) ×3 IMPLANT
KIT BASIN OR (CUSTOM PROCEDURE TRAY) ×3 IMPLANT
KIT TURNOVER KIT B (KITS) ×3 IMPLANT
NEEDLE HYPO 25X1 1.5 SAFETY (NEEDLE) ×3 IMPLANT
NS IRRIG 1000ML POUR BTL (IV SOLUTION) ×3 IMPLANT
OIL CARTRIDGE MAESTRO DRILL (MISCELLANEOUS) ×3
PACK CRANIOTOMY CUSTOM (CUSTOM PROCEDURE TRAY) ×3 IMPLANT
PAD ARMBOARD 7.5X6 YLW CONV (MISCELLANEOUS) ×9 IMPLANT
PATTIES SURGICAL .25X.25 (GAUZE/BANDAGES/DRESSINGS) IMPLANT
PATTIES SURGICAL .5 X.5 (GAUZE/BANDAGES/DRESSINGS) IMPLANT
PATTIES SURGICAL .5 X3 (DISPOSABLE) IMPLANT
PATTIES SURGICAL 1X1 (DISPOSABLE) IMPLANT
PIN MAYFIELD SKULL DISP (PIN) IMPLANT
SPONGE NEURO XRAY DETECT 1X3 (DISPOSABLE) IMPLANT
SPONGE SURGIFOAM ABS GEL 100 (HEMOSTASIS) IMPLANT
STAPLER VISISTAT 35W (STAPLE) ×3 IMPLANT
STOCKINETTE TUBULAR 6 INCH (GAUZE/BANDAGES/DRESSINGS) ×3 IMPLANT
SUT NURALON 4 0 TR CR/8 (SUTURE) ×6 IMPLANT
SUT VIC AB 2-0 CT1 18 (SUTURE) ×3 IMPLANT
SYR CONTROL 10ML LL (SYRINGE) ×3 IMPLANT
TOWEL GREEN STERILE (TOWEL DISPOSABLE) ×3 IMPLANT
TOWEL GREEN STERILE FF (TOWEL DISPOSABLE) ×3 IMPLANT
TRAY FOLEY MTR SLVR 16FR STAT (SET/KITS/TRAYS/PACK) IMPLANT
WATER STERILE IRR 1000ML POUR (IV SOLUTION) ×3 IMPLANT

## 2019-02-21 NOTE — ED Notes (Signed)
Condom cath put on pt to prevent him from continuing to get up to urinate.

## 2019-02-21 NOTE — ED Notes (Signed)
MD in room to assess patient.  Will continue to monitor.   

## 2019-02-21 NOTE — Anesthesia Postprocedure Evaluation (Signed)
Anesthesia Post Note  Patient: Preston Hill  Procedure(s) Performed: Haskell Flirt, Placement of Subdural drain (Left Head)     Patient location during evaluation: PACU Anesthesia Type: General Level of consciousness: sedated Pain management: pain level controlled Vital Signs Assessment: post-procedure vital signs reviewed and stable Respiratory status: spontaneous breathing and respiratory function stable Cardiovascular status: stable Postop Assessment: no apparent nausea or vomiting Anesthetic complications: no    Last Vitals:  Vitals:   02/21/19 2118 02/21/19 2130  BP: (!) 103/58   Pulse:  68  Resp: 15 19  Temp: (!) 36.1 C   SpO2:  98%    Last Pain:  Vitals:   02/21/19 1910  TempSrc: Oral  PainSc:                  Nieko Clarin DANIEL

## 2019-02-21 NOTE — ED Triage Notes (Signed)
Pt to ED via POV c/o problems with balance and dragging right foot x 3 days. Pt states that he has also had a headache x 2-3 days. Pt is in NAD at this time. No slurred speech noted.

## 2019-02-21 NOTE — ED Provider Notes (Addendum)
[facility] Emergency Department Provider Note       Time seen: ----------------------------------------- 12:52 PM on 02/21/2019 -----------------------------------------   I have reviewed the triage vital signs and the nursing notes.  HISTORY   Chief Complaint Weakness    HPI Preston Hill is a 85 y.o. male with a history of arthritis, skin cancer, chronic kidney disease, hypertension who presents to the ED for recent trouble with balance and dragging his right foot for 3 days.  He is also had a headache for 2 to 3 days.  He denies any falls or trauma, reports he is also had some trouble writing with his right hand.  He denies fevers, chills, chest pain, shortness of breath, vomiting or diarrhea.  Past Medical History:  Diagnosis Date  . Arthritis   . Cancer ([place])    Skin Cancer  . Chronic kidney disease   . Hypertension   . Occasional tremors   . Sleep apnea    OSA---USE C-PAP  . Tinnitus of both ears     Patient Active Problem List   Diagnosis Date Noted  . Encounter for screening colonoscopy 12/20/2015    Past Surgical History:  Procedure Laterality Date  . COLONOSCOPY  3817/2006  . COLONOSCOPY WITH PROPOFOL N/A 02/01/2016   Procedure: COLONOSCOPY WITH PROPOFOL;  Surgeon: Robert Bellow, MD;  Location: [facility] ENDOSCOPY;  Service: Endoscopy;  Laterality: N/A;  . EYE SURGERY Right    Cataract Extraction with IOL  . HERNIA REPAIR Left 1964   Inguinal Hernia Repair  . KNEE ARTHROSCOPY Left   . KNEE ARTHROSCOPY Right 06/26/2017   Procedure: ARTHROSCOPY KNEE WITH DEBRIDEMENT AND LOOSE BODY REMOVAL;  Surgeon: Corky Mull, MD;  Location: [facility];  Service: Orthopedics;  Laterality: Right;  . KNEE ARTHROSCOPY WITH MEDIAL MENISECTOMY Left 05/02/2017   Procedure: KNEE ARTHROSCOPY WITH MEDIAL MENISECTOMY;  Surgeon: Corky Mull, MD;  Location: [facility] ORS;  Service: Orthopedics;  Laterality: Left;  . KNEE ARTHROSCOPY WITH MENISCAL REPAIR   05/02/2017   Procedure: KNEE ARTHROSCOPY WITH MENISCAL REPAIR;  Surgeon: Corky Mull, MD;  Location: [facility] ORS;  Service: Orthopedics;;  . SEPTOPLASTY  2003  . uvul  2004   repair    Allergies Codeine  Social History Social History   Tobacco Use  . Smoking status: Never Smoker  . Smokeless tobacco: Never Used  Substance Use Topics  . Alcohol use: No    Alcohol/week: 0.0 standard drinks  . Drug use: No   Review of Systems Constitutional: Negative for fever. Cardiovascular: Negative for chest pain. Respiratory: Negative for shortness of breath. Gastrointestinal: Negative for abdominal pain, vomiting and diarrhea. Musculoskeletal: Negative for back pain. Skin: Negative for rash. Neurological: Positive for recent headache and balance disturbance  All systems negative/normal/unremarkable except as stated in the HPI  ____________________________________________   PHYSICAL EXAM:  VITAL SIGNS: ED Triage Vitals [02/21/19 1208]  Enc Vitals Group     BP 131/73     Pulse Rate 69     Resp 16     Temp 98.7 F (37.1 C)     Temp Source Oral     SpO2 97 %     Weight 195 lb (88.5 kg)     Height 5\' 6"  (1.676 m)     Head Circumference      Peak Flow      Pain Score 0     Pain Loc      Pain Edu?  Excl. in [place]?     Constitutional: Alert and oriented. Well appearing and in no distress. Eyes: Conjunctivae are normal. Normal extraocular movements. ENT      Head: Normocephalic and atraumatic.      Nose: No congestion/rhinnorhea.      Mouth/Throat: Mucous membranes are moist.      Neck: No stridor. Cardiovascular: Normal rate, regular rhythm. No murmurs, rubs, or gallops. Respiratory: Normal respiratory effort without tachypnea nor retractions. Breath sounds are clear and equal bilaterally. No wheezes/rales/rhonchi. Gastrointestinal: Soft and nontender. Normal bowel sounds Musculoskeletal: Nontender with normal range of motion in extremities. No lower extremity tenderness  nor edema. Neurologic:  Normal speech and language.  Mild weakness in the right hand and right leg compared to left.  No obvious sensory deficits.  Balance disturbance is noted Skin:  Skin is warm, dry and intact. No rash noted. Psychiatric: Mood and affect are normal. Speech and behavior are normal.  ____________________________________________  ED COURSE:  As part of my medical decision making, I reviewed the following data within the [place] History obtained from family if available, nursing notes, old chart and ekg, as well as notes from prior ED visits. Patient presented for recent headache that is atraumatic, we will assess with labs and imaging as indicated at this time.  EKG: Interpreted by me, sinus rhythm with first-degree AV block, prolonged PR interval, septal infarct age-indeterminate, normal QT   Procedures  Preston Hill was evaluated in Emergency Department on 02/21/2019 for the symptoms described in the history of present illness. He was evaluated in the context of the global COVID-19 pandemic, which necessitated consideration that the patient might be at risk for infection with the SARS-CoV-2 virus that causes COVID-19. Institutional protocols and algorithms that pertain to the evaluation of patients at risk for COVID-19 are in a state of rapid change based on information released by regulatory bodies including the CDC and federal and state organizations. These policies and algorithms were followed during the patient's care in the ED.  ____________________________________________   LABS (pertinent positives/negatives)  Labs Reviewed  CBC - Abnormal; Notable for the following components:      Result Value   RBC 4.18 (*)    Hemoglobin 12.9 (*)    HCT 36.9 (*)    All other components within normal limits  COMPREHENSIVE METABOLIC PANEL - Abnormal; Notable for the following components:   Glucose, Bld 108 (*)    All other components within normal limits   PROTIME-INR  APTT  DIFFERENTIAL  I-STAT CREATININE, ED  CBG MONITORING, ED   CRITICAL CARE Performed by: Laurence Aly   Total critical care time: 30 minutes  Critical care time was exclusive of separately billable procedures and treating other patients.  Critical care was necessary to treat or prevent imminent or life-threatening deterioration.  Critical care was time spent personally by me on the following activities: development of treatment plan with patient and/or surrogate as well as nursing, discussions with consultants, evaluation of patient's response to treatment, examination of patient, obtaining history from patient or surrogate, ordering and performing treatments and interventions, ordering and review of laboratory studies, ordering and review of radiographic studies, pulse oximetry and re-evaluation of patient's condition.  RADIOLOGY Images were viewed by me  CT head reveals large left-sided subdural with midline shift (my read) IMPRESSION: Left-sided subdural hematoma appears relatively chronic/complex, with possibility of superimposed acute blood products at the posterior/parietal component. The greatest depth of the fluid is measured on coronal  images approximately 2.4 cm, with mass effect resulting in effacement of the sulci of the frontal and parietal lobes as well as 9 mm of left-to-right midline shift.  There is a secondary right-sided subdural collection overlying the high right parietal region measuring 11 mm in depth with mild local mass effect.  These results were discussed by telephone at the time of interpretation on 02/21/2019 at 12:54 pm to Dr. Rudene Re.  Intracranial atherosclerosis. ____________________________________________   DIFFERENTIAL DIAGNOSIS   Subdural hematoma, subarachnoid hemorrhage, migraine, tension headache, dehydration, electrolyte abnormality  FINAL ASSESSMENT AND PLAN  Subdural hematoma   Plan: The  patient had presented for headache and weakness. Patient's labs were grossly unremarkable and surprisingly his clinical exam is reassuring at this time indicating an acute on chronic issue. Patient's imaging did reveal a large left-sided subdural with midline shift.  I will discuss with neurosurgery.  Laurence Aly, MD    Note: This note was generated in part or whole with voice recognition software. Voice recognition is usually quite accurate but there are transcription errors that can and very often do occur. I apologize for any typographical errors that were not detected and corrected.     Earleen Newport, MD 02/21/19 1308    Earleen Newport, MD 02/21/19 1311

## 2019-02-21 NOTE — Progress Notes (Signed)
Pt. Brought back into OR 18 @2109  for anesthesia concerns.
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
## 2019-02-21 NOTE — Transfer of Care (Signed)
Immediate Anesthesia Transfer of Care Note  Patient: Preston Hill  Procedure(s) Performed: Haskell Flirt, Placement of Subdural drain (Left Head)  Patient Location: PACU  Anesthesia Type:General  Level of Consciousness: drowsy  Airway & Oxygen Therapy: Patient Spontanous Breathing and Patient connected to face mask oxygen  Post-op Assessment: Report given to RN and Post -op Vital signs reviewed and stable. Patient opens eyes to stimulation, tries to talk (but tolerating oral airway in).   Post vital signs: Reviewed and stable  Last Vitals:  Vitals Value Taken Time  BP 103/58 02/21/2019  9:20 PM  Temp    Pulse 60 02/21/2019  9:26 PM  Resp 16 02/21/2019  9:26 PM  SpO2 98 % 02/21/2019  9:26 PM  Vitals shown include unvalidated device data.  Last Pain:  Vitals:   02/21/19 1910  TempSrc: Oral  PainSc:          Complications: No apparent anesthesia complications.  Postoperative seizure (see intraoperative record note)

## 2019-02-21 NOTE — H&P (Signed)
Preston Hill is an 85 y.o. male.   Chief Complaint: Weakness HPI: 85 year old male without history of significant trauma presents with progressive right upper and right lower extremity weakness and loss of dexterity.  Patient without headache.  Patient without speech abnormality.  Cognition appears at baseline.  Patient states that he is having difficulty with weakness with his right arm and leg.  He is having difficulty with his handwriting.  He can recall no prior history of accident or injury.  He has no history consistent with prior hemorrhage.  Overall medical history is reasonably good.  He does take a baby aspirin each day but takes no other antiplatelet agents or anticoagulants.  Past Medical History:  Diagnosis Date  . Arthritis   . Cancer ([place])    Skin Cancer  . Chronic kidney disease   . Hypertension   . Occasional tremors   . Sleep apnea    OSA---USE C-PAP  . Subdural hematoma ([place]) 02/21/2019  . Tinnitus of both ears     Past Surgical History:  Procedure Laterality Date  . COLONOSCOPY  3817/2006  . COLONOSCOPY WITH PROPOFOL N/A 02/01/2016   Procedure: COLONOSCOPY WITH PROPOFOL;  Surgeon: Robert Bellow, MD;  Location: [facility] ENDOSCOPY;  Service: Endoscopy;  Laterality: N/A;  . EYE SURGERY Right    Cataract Extraction with IOL  . HERNIA REPAIR Left 1964   Inguinal Hernia Repair  . KNEE ARTHROSCOPY Left   . KNEE ARTHROSCOPY Right 06/26/2017   Procedure: ARTHROSCOPY KNEE WITH DEBRIDEMENT AND LOOSE BODY REMOVAL;  Surgeon: Corky Mull, MD;  Location: [facility];  Service: Orthopedics;  Laterality: Right;  . KNEE ARTHROSCOPY WITH MEDIAL MENISECTOMY Left 05/02/2017   Procedure: KNEE ARTHROSCOPY WITH MEDIAL MENISECTOMY;  Surgeon: Corky Mull, MD;  Location: [facility] ORS;  Service: Orthopedics;  Laterality: Left;  . KNEE ARTHROSCOPY WITH MENISCAL REPAIR  05/02/2017   Procedure: KNEE ARTHROSCOPY WITH MENISCAL REPAIR;  Surgeon: Corky Mull, MD;  Location: [facility] ORS;   Service: Orthopedics;;  . SEPTOPLASTY  2003  . uvul  2004   repair    Family History  Problem Relation Age of Onset  . Hypertension Mother   . Alzheimer's disease Mother    Social History:  reports that he has never smoked. He has never used smokeless tobacco. He reports that he does not drink alcohol or use drugs.  Allergies:  Allergies  Allergen Reactions  . Codeine Anxiety    Sick     Medications Prior to Admission  Medication Sig Dispense Refill  . acetaminophen (TYLENOL) 500 MG tablet Take 1,000 mg by mouth every 6 (six) hours as needed (for pain.).    Marland Kitchen amLODipine (NORVASC) 5 MG tablet Take 10 mg by mouth daily.     Marland Kitchen aspirin EC 81 MG tablet Take 81 mg by mouth daily.    . Glucosamine Sulfate 1000 MG CAPS Take 2,000 mg by mouth daily.     Marland Kitchen ibuprofen (ADVIL,MOTRIN) 200 MG tablet Take 400 mg by mouth every 8 (eight) hours as needed (for pain.).    Marland Kitchen lisinopril (PRINIVIL,ZESTRIL) 20 MG tablet Take 20 mg by mouth daily.    . potassium chloride SA (K-DUR,KLOR-CON) 20 MEQ tablet Take 20 mEq by mouth 2 (two) times daily.    . propranolol (INDERAL) 40 MG tablet Take 40 mg by mouth 2 (two) times daily.    . tamsulosin (FLOMAX) 0.4 MG CAPS capsule Take 0.8 mg by mouth at bedtime.     Marland Kitchen  vitamin E (VITAMIN E) 400 UNIT capsule Take 400 Units by mouth daily.      Results for orders placed or performed during the hospital encounter of 02/21/19 (from the past 48 hour(s))  Protime-INR     Status: None   Collection Time: 02/21/19 12:26 PM  Result Value Ref Range   Prothrombin Time 13.0 11.4 - 15.2 seconds   INR 1.0 0.8 - 1.2    Comment: (NOTE) INR goal varies based on device and disease states. Performed at [facility], [address] [place], [place] [address]   APTT     Status: None   Collection Time: 02/21/19 12:26 PM  Result Value Ref Range   aPTT 32 24 - 36 seconds    Comment: Performed at [facility], [address] [place], [place] [address]  CBC      Status: Abnormal   Collection Time: 02/21/19 12:26 PM  Result Value Ref Range   WBC 5.3 4.0 - 10.5 K/uL   RBC 4.18 (L) 4.22 - 5.81 MIL/uL   Hemoglobin 12.9 (L) 13.0 - 17.0 g/dL   HCT 36.9 (L) 39.0 - 52.0 %   MCV 88.3 80.0 - 100.0 fL   MCH 30.9 26.0 - 34.0 pg   MCHC 35.0 30.0 - 36.0 g/dL   RDW 12.4 11.5 - 15.5 %   Platelets 171 150 - 400 K/uL   nRBC 0.0 0.0 - 0.2 %    Comment: Performed at [facility], [address] [place], [place] [address]  Differential     Status: None   Collection Time: 02/21/19 12:26 PM  Result Value Ref Range   Neutrophils Relative % 61 %   Neutro Abs 3.3 1.7 - 7.7 K/uL   Lymphocytes Relative 26 %   Lymphs Abs 1.4 0.7 - 4.0 K/uL   Monocytes Relative 11 %   Monocytes Absolute 0.6 0.1 - 1.0 K/uL   Eosinophils Relative 1 %   Eosinophils Absolute 0.0 0.0 - 0.5 K/uL   Basophils Relative 1 %   Basophils Absolute 0.0 0.0 - 0.1 K/uL   Immature Granulocytes 0 %   Abs Immature Granulocytes 0.01 0.00 - 0.07 K/uL    Comment: Performed at [facility], [address] [place], [place] [address]  Comprehensive metabolic panel     Status: Abnormal   Collection Time: 02/21/19 12:26 PM  Result Value Ref Range   Sodium 139 135 - 145 mmol/L   Potassium 3.8 3.5 - 5.1 mmol/L   Chloride 104 98 - 111 mmol/L   CO2 24 22 - 32 mmol/L   Glucose, Bld 108 (H) 70 - 99 mg/dL   BUN 20 8 - 23 mg/dL   Creatinine, Ser 0.88 0.61 - 1.24 mg/dL   Calcium 9.3 8.9 - 10.3 mg/dL   Total Protein 6.8 6.5 - 8.1 g/dL   Albumin 4.2 3.5 - 5.0 g/dL   AST 18 15 - 41 U/L   ALT 15 0 - 44 U/L   Alkaline Phosphatase 39 38 - 126 U/L   Total Bilirubin 0.8 0.3 - 1.2 mg/dL   GFR calc non Af Amer >60 >60 mL/min   GFR calc Af Amer >60 >60 mL/min   Anion gap 11 5 - 15    Comment: Performed at [facility], [address] [place], [place] [address]   Ct Head Wo Contrast  Result Date: 02/21/2019 CLINICAL DATA:  85 year old male with a history balance problems EXAM:  CT HEAD WITHOUT CONTRAST TECHNIQUE:  Contiguous axial images were obtained from the base of the skull through the vertex without intravenous contrast. COMPARISON:  None. FINDINGS: Brain: Low-density left subdural fluid collection with internal septations and relatively hyperdense margin, extends from nearly the vertex over the frontal and parietal lobes. The greatest depth is measured on the coronal images approximately 2.4 cm. There is local mass effect of the frontal and parietal lobes with left-to-right midline shift of 9 mm at the foramen of Monro. There is layered hyperdensity at the parietal component seen on coronal and axial images, potentially more acute blood products. Effacement of the underlying sulci given the mass effect. There is a right-sided subdural low-density collection towards the vertex of the parietal lobe measuring 4.4 cm x 11 mm on the coronal images. There is local mass effect of the underlying right parietal sulci. Vascular: Intracranial atherosclerosis. Skull: No acute fracture.  No aggressive bone lesion identified. Sinuses/Orbits: Complete opacification of the visualized right maxillary sinus with sclerotic changes of the maxillary sinus wall. Unremarkable ethmoid air cells frontal sinuses mastoid air cells middle ear, left maxillary sinus and the sphenoid sinuses. Other: None IMPRESSION: Left-sided subdural hematoma appears relatively chronic/complex, with possibility of superimposed acute blood products at the posterior/parietal component. The greatest depth of the fluid is measured on coronal images approximately 2.4 cm, with mass effect resulting in effacement of the sulci of the frontal and parietal lobes as well as 9 mm of left-to-right midline shift. There is a secondary right-sided subdural collection overlying the high right parietal region measuring 11 mm in depth with mild local mass effect. These results were discussed by telephone at the time of interpretation on 02/21/2019  at 12:54 pm to Dr. Rudene Re. Intracranial atherosclerosis. Electronically Signed   By: Corrie Mckusick D.O.   On: 02/21/2019 12:56    Pertinent items noted in HPI and remainder of comprehensive ROS otherwise negative.  There were no vitals taken for this visit.  Patient is awake and alert.  He is oriented and appropriate.  Speech is fluent.  His judgment and insight are intact.  Cranial nerve function demonstrates minimal right-sided facial weakness.  We may remainder of his cranial nerve function is grossly intact.  Examination of his extremity function reveals significant weakness in his right upper extremity grading out of 4/5.  He has weakness and some spasticity in his right lower extremity also grading out at 4+/5.  Remainder of his motor strength intact.  Sensory examination nonfocal.  Reflexes increased on the right, normal on the left.  Examination head ears eyes nose throat summer.  Chest and abdomen are benign.  Extremities are free from injury or deformity.  Assessment/Plan Large and left chronic subdural hematoma with significant mass-effect.  I discussed situation with the patient.  Recommend we proceed with a left-sided bur hole evacuation of subdural hematoma.  I discussed the risks and benefits including but not limited to the risk of anesthesia, bleeding, infection, brain injury including stroke coma and death.  I discussed the risk of seizure.  I discussed the risk of need for further surgery.  I have discussed the possibility of non-benefit.  The patient has been given the opportunity ask questions.  He appears to understand.  He wishes to proceed with surgery.  I also discussed situation with the patient's wife.  Cooper Render Jackelyn Illingworth 02/21/2019, 6:06 PM

## 2019-02-21 NOTE — ED Notes (Signed)
First Nurse Note: Pt to ED via POV c/o changes in balance and states that he has been dragging his right foot x 3 days. Pt denies changes in speech. Pt is in NAD .

## 2019-02-21 NOTE — Progress Notes (Signed)
PHARMACIST - PHYSICIAN ORDER COMMUNICATION  CONCERNING: P&T Medication Policy on Herbal Medications  DESCRIPTION:  This patient's order for: Glucosamine has been noted.  This product(s) is classified as an "herbal" or natural product. Due to a lack of definitive safety studies or FDA approval, nonstandard manufacturing practices, plus the potential risk of unknown drug-drug interactions while on inpatient medications, the Pharmacy and Therapeutics Committee does not permit the use of "herbal" or natural products of this type within [facility].   ACTION TAKEN: The pharmacy department is unable to verify this order at this time and your patient has been informed of this safety policy. Please reevaluate patient's clinical condition at discharge and address if the herbal or natural product(s) should be resumed at that time.   Lindell Spar, PharmD, BCPS Pager: [phone] 02/21/2019 7:04 PM

## 2019-02-21 NOTE — Anesthesia Preprocedure Evaluation (Addendum)
Anesthesia Evaluation  Patient identified by MRN, date of birth, ID band Patient awake    Reviewed: Allergy & Precautions, NPO status , Patient's Chart, lab work & pertinent test results  History of Anesthesia Complications Negative for: history of anesthetic complications  Airway Mallampati: II  TM Distance: >3 FB Neck ROM: Limited    Dental  (+) Teeth Intact, Dental Advisory Given   Pulmonary sleep apnea and Continuous Positive Airway Pressure Ventilation ,    Pulmonary exam normal        Cardiovascular hypertension, Pt. on medications and Pt. on home beta blockers negative cardio ROS Normal cardiovascular exam     Neuro/Psych    GI/Hepatic negative GI ROS, Neg liver ROS,   Endo/Other  negative endocrine ROS  Renal/GU negative Renal ROS     Musculoskeletal negative musculoskeletal ROS (+)   Abdominal   Peds  Hematology negative hematology ROS (+)   Anesthesia Other Findings Day of surgery medications reviewed with the patient.  Reproductive/Obstetrics                          Anesthesia Physical Anesthesia Plan  ASA: III and emergent  Anesthesia Plan: General   Post-op Pain Management:    Induction: Intravenous  PONV Risk Score and Plan: 3 and Ondansetron, Dexamethasone and Diphenhydramine  Airway Management Planned: Oral ETT  Additional Equipment:   Intra-op Plan:   Post-operative Plan: Extubation in OR  Informed Consent: I have reviewed the patients History and Physical, chart, labs and discussed the procedure including the risks, benefits and alternatives for the proposed anesthesia with the patient or authorized representative who has indicated his/her understanding and acceptance.     Dental advisory given  Plan Discussed with: Anesthesiologist, CRNA and Surgeon  Anesthesia Plan Comments:       Anesthesia Quick Evaluation

## 2019-02-21 NOTE — ED Notes (Signed)
emtala reviewed by this RN 

## 2019-02-21 NOTE — Plan of Care (Signed)
  Problem: Education: Goal: Knowledge of the prescribed therapeutic regimen will improve Outcome: Progressing   Problem: Activity: Goal: Ability to tolerate increased activity will improve Outcome: Progressing   

## 2019-02-21 NOTE — Anesthesia Procedure Notes (Signed)
Procedure Name: Intubation Date/Time: 02/21/2019 7:58 PM Performed by: Suzy Bouchard, CRNA Pre-anesthesia Checklist: Patient identified, Emergency Drugs available, Suction available, Patient being monitored and Timeout performed Patient Re-evaluated:Patient Re-evaluated prior to induction Oxygen Delivery Method: Circle system utilized Preoxygenation: Pre-oxygenation with 100% oxygen Induction Type: IV induction and Rapid sequence Laryngoscope Size: Miller and 2 Grade View: Grade III Tube type: Oral Tube size: 7.5 mm Number of attempts: 1 Airway Equipment and Method: Stylet Placement Confirmation: positive ETCO2 and breath sounds checked- equal and bilateral Secured at: 23 cm Tube secured with: Tape Dental Injury: Teeth and Oropharynx as per pre-operative assessment  Difficulty Due To: Difficult Airway- due to reduced neck mobility

## 2019-02-21 NOTE — Progress Notes (Signed)
Pt arrived to 4N from PACU in post-ictal state, with right upward gaze, lip smacking, and not responsive to verbal stimuli; all VS within parameters.

## 2019-02-21 NOTE — Progress Notes (Addendum)
Attempted to give beta blocker prior to pt departing for OR, however, MAR was read-only so medication could not be scanned. Medication given to transporting RNs.

## 2019-02-21 NOTE — ED Notes (Signed)
Pt belongings given to family. 

## 2019-02-21 NOTE — Brief Op Note (Signed)
02/21/2019  8:51 PM  PATIENT:  Preston Hill  85 y.o. male  PRE-OPERATIVE DIAGNOSIS:  Subdural Hematoma  POST-OPERATIVE DIAGNOSIS:  Subdural Hematoma  PROCEDURE:  Procedure(s): BURR HOLES, Placement of Subdural drain (Left)  SURGEON:  Surgeon(s) and Role:    Earnie Larsson, MD - Primary  PHYSICIAN ASSISTANT:   ASSISTANTSReinaldo Meeker, NP   ANESTHESIA:   general  EBL:  50 mL   BLOOD ADMINISTERED:none  DRAINS: (10 mm) Blake drain(s) in the Subdural space   LOCAL MEDICATIONS USED:  LIDOCAINE   SPECIMEN:  No Specimen  DISPOSITION OF SPECIMEN:  N/A  COUNTS:  YES  TOURNIQUET:  * No tourniquets in log *  DICTATION: .Dragon Dictation  PLAN OF CARE: Admit to inpatient   PATIENT DISPOSITION:  PACU - hemodynamically stable.   Delay start of Pharmacological VTE agent (>24hrs) due to surgical blood loss or risk of bleeding: yes

## 2019-02-22 ENCOUNTER — Encounter (HOSPITAL_COMMUNITY): Payer: Self-pay | Admitting: Neurosurgery

## 2019-02-22 ENCOUNTER — Other Ambulatory Visit: Payer: Self-pay

## 2019-02-22 ENCOUNTER — Inpatient Hospital Stay (HOSPITAL_COMMUNITY): Payer: Medicare HMO

## 2019-02-22 LAB — BASIC METABOLIC PANEL
Anion gap: 21 — ABNORMAL HIGH (ref 5–15)
BUN: 14 mg/dL (ref 8–23)
CO2: 17 mmol/L — ABNORMAL LOW (ref 22–32)
Calcium: 9.2 mg/dL (ref 8.9–10.3)
Chloride: 102 mmol/L (ref 98–111)
Creatinine, Ser: 1.18 mg/dL (ref 0.61–1.24)
GFR calc Af Amer: >60 mL/min (ref >60)
GFR calc non Af Amer: 57 mL/min — ABNORMAL LOW (ref >60)
Glucose, Bld: 187 mg/dL — ABNORMAL HIGH (ref 70–99)
Potassium: 3.6 mmol/L (ref 3.5–5.1)
Sodium: 140 mmol/L (ref 135–145)

## 2019-02-22 LAB — CBC
HCT: 41 % (ref 39.0–52.0)
Hemoglobin: 14 g/dL (ref 13.0–17.0)
MCH: 31 pg (ref 26.0–34.0)
MCHC: 34.1 g/dL (ref 30.0–36.0)
MCV: 90.7 fL (ref 80.0–100.0)
Platelets: 182 10*3/uL (ref 150–400)
RBC: 4.52 MIL/uL (ref 4.22–5.81)
RDW: 12.3 % (ref 11.5–15.5)
WBC: 9.9 10*3/uL (ref 4.0–10.5)
nRBC: 0 % (ref 0.0–0.2)

## 2019-02-22 LAB — GLUCOSE, CAPILLARY: Glucose-Capillary: 155 mg/dL — ABNORMAL HIGH (ref 70–99)

## 2019-02-22 MED ORDER — ORAL CARE MOUTH RINSE
15.0000 mL | Freq: Two times a day (BID) | OROMUCOSAL | Status: DC
  Administered 2019-02-22 – 2019-03-01 (×14): 15 mL via OROMUCOSAL

## 2019-02-22 MED ORDER — LEVETIRACETAM ER 500 MG PO TB24
500.0000 mg | ORAL_TABLET | Freq: Once | ORAL | Status: AC
  Administered 2019-02-22: 08:00:00 500 mg via ORAL

## 2019-02-22 MED ORDER — LORAZEPAM 2 MG/ML IJ SOLN
INTRAMUSCULAR | Status: AC
  Filled 2019-02-22: qty 1

## 2019-02-22 MED ORDER — SODIUM CHLORIDE 0.9 % IV SOLN
1000.0000 mg | Freq: Once | INTRAVENOUS | Status: AC
  Administered 2019-02-22: 1000 mg via INTRAVENOUS
  Filled 2019-02-22: qty 20

## 2019-02-22 MED ORDER — LORAZEPAM 2 MG/ML IJ SOLN
1.0000 mg | Freq: Four times a day (QID) | INTRAMUSCULAR | Status: DC | PRN
  Filled 2019-02-22: qty 1

## 2019-02-22 MED ORDER — LEVETIRACETAM IN NACL 1000 MG/100ML IV SOLN
1000.0000 mg | Freq: Two times a day (BID) | INTRAVENOUS | Status: DC
  Administered 2019-02-22 – 2019-02-25 (×6): 1000 mg via INTRAVENOUS
  Filled 2019-02-22 (×6): qty 100

## 2019-02-22 MED ORDER — PHENYTOIN SODIUM 50 MG/ML IJ SOLN
100.0000 mg | Freq: Three times a day (TID) | INTRAMUSCULAR | Status: DC
  Administered 2019-02-22 – 2019-02-25 (×10): 100 mg via INTRAVENOUS
  Filled 2019-02-22 (×10): qty 2

## 2019-02-22 MED ORDER — LACTATED RINGERS IV BOLUS
1000.0000 mL | Freq: Once | INTRAVENOUS | Status: AC
  Administered 2019-02-22: 04:00:00 1000 mL via INTRAVENOUS

## 2019-02-22 MED ORDER — LEVETIRACETAM IN NACL 500 MG/100ML IV SOLN
500.0000 mg | Freq: Once | INTRAVENOUS | Status: AC
  Administered 2019-02-22: 03:00:00 500 mg via INTRAVENOUS
  Filled 2019-02-22: qty 100

## 2019-02-22 NOTE — Progress Notes (Signed)
EEG completed, results pending. 

## 2019-02-22 NOTE — Progress Notes (Addendum)
Pt had a 120 second witnessed seizure.At this time pt became ashen in the face and oxygen saturations and HR decreased to 40s. Scheduled dilantin given. Ostergaurd paged and made aware. Verbal order for 1000 mg of Keppra (given).   Nasal airway placed by CCM and patient placed on a nonrebreathing by RT. Patient now moving upper extremities but not opening eyes or following commands.

## 2019-02-22 NOTE — Progress Notes (Signed)
Pt had seizure in elevator returning from CT, foaming at the mouth. Convulsive activity lasted ~ 60 seconds. Face turned purple, then pt went limp and ashen. BVM utilized to assist ventilation but pt still with spontaneous breathing. Reinaldo Meeker, NP called. Orders received for stat EKG, labs, and to call CCM. Per NSG, Dr. Annette Stable is en route to bedside.

## 2019-02-22 NOTE — Consult Note (Signed)
PULMONARY / CRITICAL CARE MEDICINE CONSULT NOTE   Name: Preston Hill MRN: [id number] DOB: 04-Mar-1934    ADMISSION DATE:  02/21/2019 CONSULTATION DATE:  02/22/19  REFERRING MD:  Dr Annette Stable  CHIEF COMPLAINT:  seizures  HISTORY OF PRESENT ILLNESS:   85 year old male history of chronic subdural hematoma, obstructive sleep apnea who presented on 5/23 with progressive right upper and lower extremity weakness.  This was attributed mass affect from subdural hematoma and he underwent bur hole placement in the operating room the same day.  His procedure was uncomplicated, however afterwards he was noted to have a right uppercase, lipsmacking and there was concern for possible seizure for which she was given Keppra as well as Dilantin levels by neurosurgery.  Critical care medicine was consulted regarding airway management.  PAST MEDICAL HISTORY :  He  has a past medical history of Arthritis, Cancer ([place]), Chronic kidney disease, Hypertension, Occasional tremors, Sleep apnea, Subdural hematoma ([place]) (02/21/2019), and Tinnitus of both ears.  PAST SURGICAL HISTORY: He  has a past surgical history that includes Colonoscopy (3817/2006); Septoplasty (2003); uvul (2004); Colonoscopy with propofol (N/A, 02/01/2016); Hernia repair (Left, 1964); Knee arthroscopy (Left); Eye surgery (Right); Knee arthroscopy with medial menisectomy (Left, 05/02/2017); Knee arthroscopy with meniscal repair (05/02/2017); and Knee arthroscopy (Right, 06/26/2017).  Allergies  Allergen Reactions  . Codeine Anxiety    Sick     No current facility-administered medications on file prior to encounter.    Current Outpatient Medications on File Prior to Encounter  Medication Sig  . acetaminophen (TYLENOL) 500 MG tablet Take 1,000 mg by mouth every 6 (six) hours as needed (for pain.).  Marland Kitchen amLODipine (NORVASC) 5 MG tablet Take 10 mg by mouth daily.   Marland Kitchen aspirin EC 81 MG tablet Take 81 mg by mouth daily.  . Glucosamine Sulfate 1000 MG CAPS Take  2,000 mg by mouth daily.   Marland Kitchen ibuprofen (ADVIL,MOTRIN) 200 MG tablet Take 400 mg by mouth every 8 (eight) hours as needed (for pain.).  Marland Kitchen lisinopril (PRINIVIL,ZESTRIL) 20 MG tablet Take 20 mg by mouth daily.  . potassium chloride SA (K-DUR,KLOR-CON) 20 MEQ tablet Take 20 mEq by mouth 2 (two) times daily.  . propranolol (INDERAL) 40 MG tablet Take 40 mg by mouth 2 (two) times daily.  . tamsulosin (FLOMAX) 0.4 MG CAPS capsule Take 0.8 mg by mouth at bedtime.   . vitamin E (VITAMIN E) 400 UNIT capsule Take 400 Units by mouth daily.    FAMILY HISTORY:  His He indicated that his mother is deceased. He indicated that his father is deceased.   SOCIAL HISTORY: He  reports that he has never smoked. He has never used smokeless tobacco. He reports that he does not drink alcohol or use drugs.  REVIEW OF SYSTEMS:   Unable to obtain   VITAL SIGNS: BP (!) 74/49 Comment: MD notified  Pulse 64   Temp 98.1 F (36.7 C) (Axillary)   Resp 13   Ht 5\' 6"  (1.676 m)   Wt 80.6 kg   SpO2 96%   BMI 28.68 kg/m   HEMODYNAMICS:    VENTILATOR SETTINGS:    INTAKE / OUTPUT: I/O last 3 completed shifts: In: -  Out: 550 [Urine:550]  PHYSICAL EXAMINATION: Physical Exam Constitutional:      Comments: Sitting up in bed muttering to himself, non-toxic appearing  HENT:     Head:     Comments: Burr hhole covering on scalp Eyes:     General: No scleral icterus.  Right eye: No discharge.        Left eye: No discharge.  Neck:     Vascular: No JVD.  Cardiovascular:     Rate and Rhythm: Normal rate.     Heart sounds: No murmur.  Pulmonary:     Effort: Pulmonary effort is normal. No respiratory distress.     Breath sounds: Normal breath sounds. No stridor.  Abdominal:     General: There is no distension.     Palpations: Abdomen is soft.     Tenderness: There is no abdominal tenderness.  Musculoskeletal:        General: No deformity.  Skin:    General: Skin is warm and dry.  Neurological:      Comments: Intermittently withdraws and localizes to pain in all 4 extremities. Awake, not alert, not responsive to verbal commands. Pupils initially right upward gaze. Pupils 3 mm and reactive.     LABS:  BMET Recent Labs  Lab 02/21/19 1226 02/22/19 0224  NA 139 140  K 3.8 3.6  CL 104 102  CO2 24 17*  BUN 20 14  CREATININE 0.88 1.18  GLUCOSE 108* 187*    Electrolytes Recent Labs  Lab 02/21/19 1226 02/22/19 0224  CALCIUM 9.3 9.2    CBC Recent Labs  Lab 02/21/19 1226 02/22/19 0224  WBC 5.3 9.9  HGB 12.9* 14.0  HCT 36.9* 41.0  PLT 171 182    Coag's Recent Labs  Lab 02/21/19 1226  APTT 32  INR 1.0    Sepsis Markers No results for input(s): LATICACIDVEN, PROCALCITON, O2SATVEN in the last 168 hours.  ABG No results for input(s): PHART, PCO2ART, PO2ART in the last 168 hours.  Liver Enzymes Recent Labs  Lab 02/21/19 1226  AST 18  ALT 15  ALKPHOS 39  BILITOT 0.8  ALBUMIN 4.2    Cardiac Enzymes No results for input(s): TROPONINI, PROBNP in the last 168 hours.  Glucose No results for input(s): GLUCAP in the last 168 hours.  Imaging Ct Head Wo Contrast  Result Date: 02/22/2019 CLINICAL DATA:  85 y/o  M; subdural hemorrhage post evacuation. EXAM: CT HEAD WITHOUT CONTRAST TECHNIQUE: Contiguous axial images were obtained from the base of the skull through the vertex without intravenous contrast. COMPARISON:  02/21/2019 CT head. FINDINGS: Brain: Interval evacuation of left-sided subdural hematoma with drain in the extra-axial space. Small volume of postprocedural pneumocephalus. The collection over the left cerebral convexity is markedly decreased with 8 mm residual (series 5, image 34). Associated mass effect is decreased with 5 mm left-to-right midline shift and improved patency of left lateral ventricle. Subdural collection over the right cerebral convexity is stable. There is a new small volume of subarachnoid hemorrhage over the right greater than left  lateral convexities, right superior cerebellum, and interpeduncular cistern. Stable small chronic infarcts in the right cerebellar hemisphere and right anterior basal ganglia. No acute stroke, brain parenchymal hemorrhage, or herniation. Vascular: Calcific atherosclerosis of the carotid arteries and vertebral arteries. No hyperdense vessel. Skull: Postsurgical changes with 2 burr holes in the right lateral convexity as well as edema and pneumatosis of the overlying scalp. Skin staples noted. Sinuses/Orbits: Chronic right maxillary sinus disease with sinus opacification and high attenuation inspissated secretions. Additional included paranasal sinuses are normally aerated as are the mastoid air cells. Bilateral intra-ocular lens replacement. Other: None. IMPRESSION: 1. Interval evacuation of left-sided subdural hematoma with drain placement. The residual left-sided subdural hematoma measures up to 8 mm in thickness. Expected postsurgical changes related to  left-sided burr holes. 2. Decreased mass effect with 5 mm left-to-right midline shift and improved patency of left lateral ventricle. 3. New small volume of subarachnoid hemorrhage. 4. Stable right convexity subdural hematoma. Electronically Signed   By: Kristine Garbe M.D.   On: 02/22/2019 02:23   Ct Head Wo Contrast  Result Date: 02/21/2019 CLINICAL DATA:  85 year old male with a history balance problems EXAM: CT HEAD WITHOUT CONTRAST TECHNIQUE: Contiguous axial images were obtained from the base of the skull through the vertex without intravenous contrast. COMPARISON:  None. FINDINGS: Brain: Low-density left subdural fluid collection with internal septations and relatively hyperdense margin, extends from nearly the vertex over the frontal and parietal lobes. The greatest depth is measured on the coronal images approximately 2.4 cm. There is local mass effect of the frontal and parietal lobes with left-to-right midline shift of 9 mm at the foramen  of [place]. There is layered hyperdensity at the parietal component seen on coronal and axial images, potentially more acute blood products. Effacement of the underlying sulci given the mass effect. There is a right-sided subdural low-density collection towards the vertex of the parietal lobe measuring 4.4 cm x 11 mm on the coronal images. There is local mass effect of the underlying right parietal sulci. Vascular: Intracranial atherosclerosis. Skull: No acute fracture.  No aggressive bone lesion identified. Sinuses/Orbits: Complete opacification of the visualized right maxillary sinus with sclerotic changes of the maxillary sinus wall. Unremarkable ethmoid air cells frontal sinuses mastoid air cells middle ear, left maxillary sinus and the sphenoid sinuses. Other: None IMPRESSION: Left-sided subdural hematoma appears relatively chronic/complex, with possibility of superimposed acute blood products at the posterior/parietal component. The greatest depth of the fluid is measured on coronal images approximately 2.4 cm, with mass effect resulting in effacement of the sulci of the frontal and parietal lobes as well as 9 mm of left-to-right midline shift. There is a secondary right-sided subdural collection overlying the high right parietal region measuring 11 mm in depth with mild local mass effect. These results were discussed by telephone at the time of interpretation on 02/21/2019 at 12:54 pm to Dr. Rudene Re. Intracranial atherosclerosis. Electronically Signed   By: Corrie Mckusick D.O.   On: 02/21/2019 12:56     STUDIES:  02/22/19 CTH IMPRESSION: 1. Interval evacuation of left-sided subdural hematoma with drain placement. The residual left-sided subdural hematoma measures up to 8 mm in thickness. Expected postsurgical changes related to left-sided burr holes. 2. Decreased mass effect with 5 mm left-to-right midline shift and improved patency of left lateral ventricle. 3. New small volume of  subarachnoid hemorrhage. 4. Stable right convexity subdural hematoma.  [facility] 5/23 IMPRESSION: Left-sided subdural hematoma appears relatively chronic/complex, with possibility of superimposed acute blood products at the posterior/parietal component. The greatest depth of the fluid is measured on coronal images approximately 2.4 cm, with mass effect resulting in effacement of the sulci of the frontal and parietal lobes as well as 9 mm of left-to-right midline shift.  There is a secondary right-sided subdural collection overlying the high right parietal region measuring 11 mm in depth with mild local mass effect.  CULTURES: none  ANTIBIOTICS: Cefazolin perioperative  SIGNIFICANT EVENTS: 5/23 burr hole placement  LINES/TUBES: none  DISCUSSION: none  ASSESSMENT / PLAN: 85 year old male history of chronic subdural hematoma, obstructive sleep apnea who presented on 5/23 with progressive right upper and lower extremity weakness, underwent burr hole placement for obstructive SDH now postoperatively concern for seizures.  Seizures Encephalopathy - patient monitored, does  appear to be protecting his airway and would expect mental status to improve with antiepileptics and placement of burr hole.  Discussed with Dr Annette Stable of Neurosurgery and will hold off on intubation at this time but continue to monitor. He is in agreement - antiepileptics per neurosurgery   Mali Shivangi Lutz, MD Pulmonary and [place] Pager: [phone]  02/22/2019, 4:02 AM

## 2019-02-22 NOTE — Procedures (Signed)
History: 85 yo F s/p L SDH evacuation being evaluated for seizures  Sedation: None  Technique: This is a 21 channel routine scalp EEG performed at the bedside with bipolar and monopolar montages arranged in accordance to the international 10/20 system of electrode placement. One channel was dedicated to EKG recording.    Background: There is a posterior dominant rhythm seen at times achieving a frequency of 8 Hz.  In addition, there is generalized irregular delta and theta range activity intruding into the background.  There is focal irregular delta most prominent in the left central parietal region with occasional intermixed sharp wave discharges.  There are occasional runs of nonsustained lateralized periodic discharges in the same region, smoothly contoured without any evidence of evolution.  Photic stimulation: Physiologic driving is not performed  EEG Abnormalities: 1) nonsustained brief runs of lateralized periodic discharges without evolution. 2) left central parietal sharp wave discharges 3) focal left hemispheric irregular delta activity 4) generalized irregular slow activity.  Clinical Interpretation: This EEG is consistent with the patient's known structural abnormality on the left, with associated significant cortical irritability which could serve as a potential seizure focus.   There was no seizure recorded on this study. Please note that lack of epileptiform activity on EEG does not preclude the possibility of epilepsy.   Roland Rack, MD [facility] [phone]  If 7pm- 7am, please page neurology on call as listed in [place].

## 2019-02-22 NOTE — Progress Notes (Deleted)
Pt able to answer simple yes no questions. When asked if his name was Josph Macho he said "no". When asked if his name was Preston Hill he said "yes". Patient also said "yes" to his wife's name being [place].   Patient tracks eyes to sounds in the room and voice. Still not able to follow physical commands.

## 2019-02-22 NOTE — Progress Notes (Signed)
Neurosurgery Service Progress Note  Subjective: two clinical seizures overnight  Objective: Vitals:   02/22/19 0500 02/22/19 0600 02/22/19 0700 02/22/19 0800  BP: (!) 100/56 110/66 (!) 134/94 106/68  Pulse: 63 (!) 56 78 76  Resp: 13 12 (!) 28 15  Temp:    97.8 F (36.6 C)  TempSrc:    Axillary  SpO2: 96% 98% 98% 96%  Weight:      Height:       Temp (24hrs), Avg:98 F (36.7 C), Min:97 F (36.1 C), Max:98.7 F (37.1 C)  CBC Latest Ref Rng & Units 02/22/2019 02/21/2019 04/25/2017  WBC 4.0 - 10.5 K/uL 9.9 5.3 4.8  Hemoglobin 13.0 - 17.0 g/dL 14.0 12.9(L) 14.3  Hematocrit 39.0 - 52.0 % 41.0 36.9(L) 41.4  Platelets 150 - 400 K/uL 182 171 161   BMP Latest Ref Rng & Units 02/22/2019 02/21/2019 04/25/2017  Glucose 70 - 99 mg/dL 187(H) 108(H) 107(H)  BUN 8 - 23 mg/dL 14 20 16   Creatinine 0.61 - 1.24 mg/dL 1.18 0.88 0.78  Sodium 135 - 145 mmol/L 140 139 140  Potassium 3.5 - 5.1 mmol/L 3.6 3.8 3.6  Chloride 98 - 111 mmol/L 102 104 105  CO2 22 - 32 mmol/L 17(L) 24 27  Calcium 8.9 - 10.3 mg/dL 9.2 9.3 9.2    Intake/Output Summary (Last 24 hours) at 02/22/2019 0847 Last data filed at 02/22/2019 0800 Gross per 24 hour  Intake 3230.59 ml  Output 2645 ml  Net 585.59 ml    Current Facility-Administered Medications:  .  0.9 %  sodium chloride infusion, , Intravenous, Continuous, Pool, Mallie Mussel, MD, Stopped at 02/22/19 0410 .  acetaminophen (TYLENOL) tablet 650 mg, 650 mg, Oral, Q4H PRN **OR** acetaminophen (TYLENOL) suppository 650 mg, 650 mg, Rectal, Q4H PRN, Pool, Mallie Mussel, MD .  ceFAZolin (ANCEF) IVPB 1 g/50 mL premix, 1 g, Intravenous, Q8H, Earnie Larsson, MD, Stopped at 02/22/19 0600 .  HYDROcodone-acetaminophen (NORCO/VICODIN) 5-325 MG per tablet 1 tablet, 1 tablet, Oral, Q4H PRN, Earnie Larsson, MD .  HYDROmorphone (DILAUDID) injection 0.5-1 mg, 0.5-1 mg, Intravenous, Q2H PRN, Earnie Larsson, MD .  labetalol (NORMODYNE) injection 10-40 mg, 10-40 mg, Intravenous, Q10 min PRN, Earnie Larsson, MD .   levETIRAcetam (KEPPRA) IVPB 500 mg/100 mL premix, 500 mg, Intravenous, Q12H, Pool, Mallie Mussel, MD, Last Rate: 400 mL/hr at 02/22/19 0800, 500 mg at 02/22/19 0800 .  LORazepam (ATIVAN) 2 MG/ML injection, , , ,  .  LORazepam (ATIVAN) injection 1 mg, 1 mg, Intravenous, Q6H PRN, Clydell Hakim, MD .  MEDLINE mouth rinse, 15 mL, Mouth Rinse, BID, Earnie Larsson, MD, 15 mL at 02/22/19 0804 .  ondansetron (ZOFRAN) tablet 4 mg, 4 mg, Oral, Q4H PRN **OR** ondansetron (ZOFRAN) injection 4 mg, 4 mg, Intravenous, Q4H PRN, Pool, Mallie Mussel, MD .  pantoprazole (PROTONIX) injection 40 mg, 40 mg, Intravenous, QHS, Pool, Mallie Mussel, MD, 40 mg at 02/21/19 2314 .  phenytoin (DILANTIN) injection 100 mg, 100 mg, Intravenous, Q8H, Pool, Mallie Mussel, MD, 100 mg at 02/22/19 0750 .  promethazine (PHENERGAN) tablet 12.5-25 mg, 12.5-25 mg, Oral, Q4H PRN, Earnie Larsson, MD   Physical Exam: Eyes closed, PERRL, gaze neutral, moving LUE spontaneously in non-stereotyped movements, w/d x4, not FC Incision c/d/i, drain in place with JP bulb containing serosang fluid  Assessment & Plan: 85 y.o. man s/p evacuation of SDH, recovering well.  -currently appears post-ictal -loaded w/ additional 1g keppra given Sz this morning -will increase scheduled keppra to 1g bid -on PHT 100tid, will order level for tomorrow morning  Judith Part  02/22/19 8:47 AM

## 2019-02-22 NOTE — Progress Notes (Signed)
NAME:  Preston Hill, MRN:  [id number], DOB:  Oct 05, 1933, LOS: 1 ADMISSION DATE:  02/21/2019, CONSULTATION DATE:  02/22/2019 REFERRING MD:  Dr. Annette Stable, CHIEF COMPLAINT:  seizures   Brief History   85 y/o with subdural hematoma, presented with progressive weakness; taken to OR 5/23 for evac. Now with some seizure activity  History of present illness   85 year old male history of chronic subdural hematoma, obstructive sleep apnea who presented on 5/23 with progressive right upper and lower extremity weakness.  This was attributed mass affect from subdural hematoma and he underwent bur hole placement in the operating room the same day.  His procedure was uncomplicated, however afterwards he was noted to have a right uppercase, lipsmacking and there was concern for possible seizure for which she was given Keppra as well as Dilantin levels by neurosurgery.  Critical care medicine was consulted regarding airway management.  Past Medical History  He  has a past medical history of Arthritis, Cancer ([place]), Chronic kidney disease, Hypertension, Occasional tremors, Sleep apnea, Subdural hematoma ([place]) (02/21/2019), and Tinnitus of both ears.  Significant Hospital Events   5/23-OR for subdural evacuation 5/24-seizures, including one this a.m. at ~0800  Consults:  PCCM neurology  Procedures:    Significant Diagnostic Tests:  5/24 0200 CT head: IMPRESSION: 1. Interval evacuation of left-sided subdural hematoma with drain placement. The residual left-sided subdural hematoma measures up to 8 mm in thickness. Expected postsurgical changes related to left-sided burr holes. 2. Decreased mass effect with 5 mm left-to-right midline shift and improved patency of left lateral ventricle. 3. New small volume of subarachnoid hemorrhage. 4. Stable right convexity subdural hematoma.   Micro Data:  N/a  Antimicrobials:  Post op ancef x2 doses   Interim history/subjective:  Pt had seizure this a.m.,  somewhat prolonged but now post ictal. Neurologist happened to be on floor; ordered keppra load.   Objective   Blood pressure (!) 134/94, pulse 78, temperature 98.1 F (36.7 C), temperature source Axillary, resp. rate (!) 28, height 5\' 6"  (1.676 m), weight 80.6 kg, SpO2 98 %.        Intake/Output Summary (Last 24 hours) at 02/22/2019 0815 Last data filed at 02/22/2019 0800 Gross per 24 hour  Intake 3230.59 ml  Output 2585 ml  Net 645.59 ml   Filed Weights   02/21/19 1910  Weight: 80.6 kg    Examination: General: NAD,  HENT: left surgical site intact, drain in place.  Lungs: CTA, upper airway obstructing improved with nasal airway Cardiovascular: RRR Abdomen: SND Extremities: no edema, good cap refill Neuro: post-ictal, w/d to nox. Stim. PERRL;  GU: foley in place  Resolved Hospital Problem list     Assessment & Plan:  85 year old with fairly acute decline 5/23 taken to OR for evac of subdural hem. Seizures today.   1) seizure- PLAN: --Keppra load per NSU.  --ativan if additional seizure activity --EEG ordered  2) Hypoxia/airway PLAN: --OSA, CPAP at home.  --nasal airway this a.m. relieve upper airway obstruction. Pt ventilating w/o issue  Best practice:  Diet: NPO for now Pain/Anxiety/Delirium protocol (if indicated): n/a VAP protocol (if indicated): n/a DVT prophylaxis: SCD GI prophylaxis: HOB >30, PPI Glucose control: SSI Mobility: bed rest Code Status: full Family Communication: will discuss issues with family as needed Disposition: 4N ICU  Labs   CBC: Recent Labs  Lab 02/21/19 1226 02/22/19 0224  WBC 5.3 9.9  NEUTROABS 3.3  --   HGB 12.9* 14.0  HCT 36.9* 41.0  MCV 88.3 90.7  PLT 171 778    Basic Metabolic Panel: Recent Labs  Lab 02/21/19 1226 02/22/19 0224  NA 139 140  K 3.8 3.6  CL 104 102  CO2 24 17*  GLUCOSE 108* 187*  BUN 20 14  CREATININE 0.88 1.18  CALCIUM 9.3 9.2   GFR: Estimated Creatinine Clearance: 48.1 mL/min (by C-G  formula based on SCr of 1.18 mg/dL). Recent Labs  Lab 02/21/19 1226 02/22/19 0224  WBC 5.3 9.9    Liver Function Tests: Recent Labs  Lab 02/21/19 1226  AST 18  ALT 15  ALKPHOS 39  BILITOT 0.8  PROT 6.8  ALBUMIN 4.2   No results for input(s): LIPASE, AMYLASE in the last 168 hours. No results for input(s): AMMONIA in the last 168 hours.  ABG No results found for: PHART, PCO2ART, PO2ART, HCO3, TCO2, ACIDBASEDEF, O2SAT   Coagulation Profile: Recent Labs  Lab 02/21/19 1226  INR 1.0    Cardiac Enzymes: No results for input(s): CKTOTAL, CKMB, CKMBINDEX, TROPONINI in the last 168 hours.  HbA1C: No results found for: HGBA1C  CBG: No results for input(s): GLUCAP in the last 168 hours.  Review of Systems:   Unable to obtain; pt post-ictal  Past Medical History  He,  has a past medical history of Arthritis, Cancer ([place]), Chronic kidney disease, Hypertension, Occasional tremors, Sleep apnea, Subdural hematoma ([place]) (02/21/2019), and Tinnitus of both ears.   Surgical History    Past Surgical History:  Procedure Laterality Date  . BURR HOLE Left 02/21/2019   Procedure: BURR HOLES, Placement of Subdural drain;  Surgeon: Earnie Larsson, MD;  Location: [place];  Service: Neurosurgery;  Laterality: Left;  . COLONOSCOPY  3817/2006  . COLONOSCOPY WITH PROPOFOL N/A 02/01/2016   Procedure: COLONOSCOPY WITH PROPOFOL;  Surgeon: Robert Bellow, MD;  Location: [facility] ENDOSCOPY;  Service: Endoscopy;  Laterality: N/A;  . EYE SURGERY Right    Cataract Extraction with IOL  . HERNIA REPAIR Left 1964   Inguinal Hernia Repair  . KNEE ARTHROSCOPY Left   . KNEE ARTHROSCOPY Right 06/26/2017   Procedure: ARTHROSCOPY KNEE WITH DEBRIDEMENT AND LOOSE BODY REMOVAL;  Surgeon: Corky Mull, MD;  Location: [place];  Service: Orthopedics;  Laterality: Right;  . KNEE ARTHROSCOPY WITH MEDIAL MENISECTOMY Left 05/02/2017   Procedure: KNEE ARTHROSCOPY WITH MEDIAL MENISECTOMY;  Surgeon: Corky Mull,  MD;  Location: [facility] ORS;  Service: Orthopedics;  Laterality: Left;  . KNEE ARTHROSCOPY WITH MENISCAL REPAIR  05/02/2017   Procedure: KNEE ARTHROSCOPY WITH MENISCAL REPAIR;  Surgeon: Corky Mull, MD;  Location: [facility] ORS;  Service: Orthopedics;;  . SEPTOPLASTY  2003  . uvul  2004   repair     Social History   reports that he has never smoked. He has never used smokeless tobacco. He reports that he does not drink alcohol or use drugs.   Family History   His family history includes Alzheimer's disease in his mother; Hypertension in his mother.   Allergies Allergies  Allergen Reactions  . Codeine Anxiety    Sick      Home Medications  Prior to Admission medications   Medication Sig Start Date End Date Taking? Authorizing Provider  acetaminophen (TYLENOL) 500 MG tablet Take 1,000 mg by mouth every 6 (six) hours as needed (for pain.).    [provider]  amLODipine (NORVASC) 5 MG tablet Take 10 mg by mouth daily.     [provider]  aspirin EC 81 MG tablet Take  81 mg by mouth daily.    [provider]  Glucosamine Sulfate 1000 MG CAPS Take 2,000 mg by mouth daily.     [provider]  ibuprofen (ADVIL,MOTRIN) 200 MG tablet Take 400 mg by mouth every 8 (eight) hours as needed (for pain.).    [provider]  lisinopril (PRINIVIL,ZESTRIL) 20 MG tablet Take 20 mg by mouth daily. 03/06/17   [provider]  potassium chloride SA (K-DUR,KLOR-CON) 20 MEQ tablet Take 20 mEq by mouth 2 (two) times daily.    [provider]  propranolol (INDERAL) 40 MG tablet Take 40 mg by mouth 2 (two) times daily.    [provider]  tamsulosin (FLOMAX) 0.4 MG CAPS capsule Take 0.8 mg by mouth at bedtime.     [provider]  vitamin E (VITAMIN E) 400 UNIT capsule Take 400 Units by mouth daily.    [provider]     Critical care time:  I have independently seen and examined the patient, reviewed data, and developed an  assessment and plan. A total of 36 minutes were spent in critical care assessment and medical decision making. This critical care time does not reflect procedure time, or teaching time or supervisory time of PA/NP/Med student/Med Resident, etc but could involve care discussion time. Agree with the documentation above.   Bonna Gains, MD PhD 02/22/19 8:46 AM

## 2019-02-23 DIAGNOSIS — R569 Unspecified convulsions: Secondary | ICD-10-CM

## 2019-02-23 DIAGNOSIS — S065X9A Traumatic subdural hemorrhage with loss of consciousness of unspecified duration, initial encounter: Secondary | ICD-10-CM

## 2019-02-23 LAB — CBC WITH DIFFERENTIAL/PLATELET
Abs Immature Granulocytes: 0 10*3/uL (ref 0.00–0.07)
Basophils Absolute: 0 10*3/uL (ref 0.0–0.1)
Basophils Relative: 0 %
Eosinophils Absolute: 0 10*3/uL (ref 0.0–0.5)
Eosinophils Relative: 0 %
HCT: 35 % — ABNORMAL LOW (ref 39.0–52.0)
Hemoglobin: 12 g/dL — ABNORMAL LOW (ref 13.0–17.0)
Lymphocytes Relative: 8 %
Lymphs Abs: 0.7 10*3/uL (ref 0.7–4.0)
MCH: 30.3 pg (ref 26.0–34.0)
MCHC: 34.3 g/dL (ref 30.0–36.0)
MCV: 88.4 fL (ref 80.0–100.0)
Monocytes Absolute: 0.5 10*3/uL (ref 0.1–1.0)
Monocytes Relative: 6 %
Neutro Abs: 7.5 10*3/uL (ref 1.7–7.7)
Neutrophils Relative %: 86 %
Platelets: 154 10*3/uL (ref 150–400)
RBC: 3.96 MIL/uL — ABNORMAL LOW (ref 4.22–5.81)
RDW: 12.3 % (ref 11.5–15.5)
WBC: 8.7 10*3/uL (ref 4.0–10.5)
nRBC: 0 % (ref 0.0–0.2)
nRBC: 0 /100 WBC

## 2019-02-23 LAB — BASIC METABOLIC PANEL
Anion gap: 11 (ref 5–15)
BUN: 14 mg/dL (ref 8–23)
CO2: 25 mmol/L (ref 22–32)
Calcium: 8.4 mg/dL — ABNORMAL LOW (ref 8.9–10.3)
Chloride: 103 mmol/L (ref 98–111)
Creatinine, Ser: 0.91 mg/dL (ref 0.61–1.24)
GFR calc Af Amer: >60 mL/min (ref >60)
GFR calc non Af Amer: >60 mL/min (ref >60)
Glucose, Bld: 120 mg/dL — ABNORMAL HIGH (ref 70–99)
Potassium: 3 mmol/L — ABNORMAL LOW (ref 3.5–5.1)
Sodium: 139 mmol/L (ref 135–145)

## 2019-02-23 LAB — MAGNESIUM: Magnesium: 1.7 mg/dL (ref 1.7–2.4)

## 2019-02-23 MED ORDER — LIDOCAINE HCL (PF) 1 % IJ SOLN
INTRAMUSCULAR | Status: AC
  Filled 2019-02-23: qty 5

## 2019-02-23 MED ORDER — SODIUM CHLORIDE 0.9 % IV SOLN: INTRAVENOUS | Status: DC | PRN

## 2019-02-23 MED ORDER — POTASSIUM CHLORIDE 10 MEQ/100ML IV SOLN
10.0000 meq | INTRAVENOUS | Status: AC
  Administered 2019-02-23 (×4): 10 meq via INTRAVENOUS
  Filled 2019-02-23 (×4): qty 100

## 2019-02-23 NOTE — Progress Notes (Signed)
Rehab Admissions Coordinator Note:  Patient was screened by Jhonnie Garner for appropriateness for an Inpatient Acute Rehab Consult.  At this time, we are recommending Inpatient Rehab consult. AC to contact MD to request an order.   Jhonnie Garner 02/23/2019, 1:45 PM  I can be reached at [phone].

## 2019-02-23 NOTE — Progress Notes (Signed)
Neurosurgery Service Progress Note  Subjective: No clinical seizures overnight  Objective: Vitals:   02/23/19 1000 02/23/19 1100 02/23/19 1200 02/23/19 1300  BP: 134/69 135/67 (!) 88/58 104/71  Pulse:   97   Resp:   16   Temp:   98.1 F (36.7 C)   TempSrc:   Axillary   SpO2:   93%   Weight:      Height:       Temp (24hrs), Avg:99.1 F (37.3 C), Min:98.1 F (36.7 C), Max:99.9 F (37.7 C)  CBC Latest Ref Rng & Units 02/23/2019 02/22/2019 02/21/2019  WBC 4.0 - 10.5 K/uL 8.7 9.9 5.3  Hemoglobin 13.0 - 17.0 g/dL 12.0(L) 14.0 12.9(L)  Hematocrit 39.0 - 52.0 % 35.0(L) 41.0 36.9(L)  Platelets 150 - 400 K/uL 154 182 171   BMP Latest Ref Rng & Units 02/23/2019 02/22/2019 02/21/2019  Glucose 70 - 99 mg/dL 120(H) 187(H) 108(H)  BUN 8 - 23 mg/dL 14 14 20   Creatinine 0.61 - 1.24 mg/dL 0.91 1.18 0.88  Sodium 135 - 145 mmol/L 139 140 139  Potassium 3.5 - 5.1 mmol/L 3.0(L) 3.6 3.8  Chloride 98 - 111 mmol/L 103 102 104  CO2 22 - 32 mmol/L 25 17(L) 24  Calcium 8.9 - 10.3 mg/dL 8.4(L) 9.2 9.3    Intake/Output Summary (Last 24 hours) at 02/23/2019 1409 Last data filed at 02/23/2019 1334 Gross per 24 hour  Intake 1576.62 ml  Output 2820 ml  Net -1243.38 ml    Current Facility-Administered Medications:  .  0.9 %  sodium chloride infusion, , Intravenous, Continuous, Pool, Mallie Mussel, MD, Last Rate: 75 mL/hr at 02/23/19 0700 .  0.9 %  sodium chloride infusion, , Intravenous, PRN, Earnie Larsson, MD .  acetaminophen (TYLENOL) tablet 650 mg, 650 mg, Oral, Q4H PRN **OR** acetaminophen (TYLENOL) suppository 650 mg, 650 mg, Rectal, Q4H PRN, Earnie Larsson, MD .  HYDROcodone-acetaminophen (NORCO/VICODIN) 5-325 MG per tablet 1 tablet, 1 tablet, Oral, Q4H PRN, Earnie Larsson, MD .  HYDROmorphone (DILAUDID) injection 0.5-1 mg, 0.5-1 mg, Intravenous, Q2H PRN, Earnie Larsson, MD, 0.5 mg at 02/23/19 0445 .  labetalol (NORMODYNE) injection 10-40 mg, 10-40 mg, Intravenous, Q10 min PRN, Earnie Larsson, MD .  levETIRAcetam  (KEPPRA) IVPB 1000 mg/100 mL premix, 1,000 mg, Intravenous, Q12H, Judith Part, MD, Last Rate: 400 mL/hr at 02/23/19 0839, 1,000 mg at 02/23/19 0839 .  LORazepam (ATIVAN) injection 1 mg, 1 mg, Intravenous, Q6H PRN, Clydell Hakim, MD .  MEDLINE mouth rinse, 15 mL, Mouth Rinse, BID, Earnie Larsson, MD, 15 mL at 02/23/19 0911 .  ondansetron (ZOFRAN) tablet 4 mg, 4 mg, Oral, Q4H PRN **OR** ondansetron (ZOFRAN) injection 4 mg, 4 mg, Intravenous, Q4H PRN, Pool, Mallie Mussel, MD .  pantoprazole (PROTONIX) injection 40 mg, 40 mg, Intravenous, QHS, Pool, Mallie Mussel, MD, 40 mg at 02/22/19 2137 .  phenytoin (DILANTIN) injection 100 mg, 100 mg, Intravenous, Q8H, Pool, Mallie Mussel, MD, 100 mg at 02/23/19 1336 .  potassium chloride 10 mEq in 100 mL IVPB, 10 mEq, Intravenous, Q1 Hr x 4, Al[place], Rakesh V, MD, Last Rate: 100 mL/hr at 02/23/19 1334, 10 mEq at 02/23/19 1334 .  promethazine (PHENERGAN) tablet 12.5-25 mg, 12.5-25 mg, Oral, Q4H PRN, Earnie Larsson, MD   Physical Exam: Eyes open, PERRL, gaze neutral, FCx4, speech improved but still decreased output Incision c/d/i, drain in place with JP bulb containing serosang fluid  Assessment & Plan: 85 y.o. man s/p evacuation of SDH, 5/24 2 clinical seizures, 5/25 EEG w/ L PLEDs and central sharps  -  cont AEDs, exam improving, f/u EEG -keep in 4N until exam normalizes or PLEDs clear  Judith Part  02/23/19 2:09 PM

## 2019-02-23 NOTE — Progress Notes (Signed)
RT attempted cpap with pt. Pt. Tried the cpap for about a few minutes and then realized he did not like it. Pt. States it was not comfortable and that it was too much air. RT adjusted the settings for the pt. And pt. Still did not tolerate. Pt. Placed back on nasal cannula.

## 2019-02-23 NOTE — Progress Notes (Signed)
   NAME:  Preston Hill, MRN:  [id number], DOB:  05-19-1934, LOS: 2 ADMISSION DATE:  02/21/2019, CONSULTATION DATE:  02/22/2019 REFERRING MD:  Dr. Annette Stable, CHIEF COMPLAINT:  seizures   Brief History   85 y/o with subdural hematoma, presented with progressive weakness; taken to OR 5/23 for evac. Developed seizure activity post procedure and also on 5/24    Past Medical History  He  has a past medical history of Arthritis, Cancer ([place]), Chronic kidney disease, Hypertension, Occasional tremors, Sleep apnea, Subdural hematoma ([place]) (02/21/2019), and Tinnitus of both ears.  Significant Hospital Events   5/23-OR for subdural evacuation 5/24-seizures, including one this a.m. at ~0800>>  keppra load.   Consults:  PCCM neurology  Procedures:    Significant Diagnostic Tests:  5/24 0200 CT head: IMPRESSION: 1. Interval evacuation of left-sided subdural hematoma with drain placement. The residual left-sided subdural hematoma measures up to 8 mm in thickness. Expected postsurgical changes related to left-sided burr holes. 2. Decreased mass effect with 5 mm left-to-right midline shift and improved patency of left lateral ventricle. 3. New small volume of subarachnoid hemorrhage. 4. Stable right convexity subdural hematoma.  EEG 5/24 cortical irritability on left   Micro Data:  N/a  Antimicrobials:  Post op ancef x2 doses   Interim history/subjective:  Afebrile Last seizure at 8 AM on 5/24 More awake this morning Good urine output  Objective   Blood pressure 128/69, pulse (!) 57, temperature 98.3 F (36.8 C), temperature source Oral, resp. rate 16, height 5\' 6"  (1.676 m), weight 80.6 kg, SpO2 93 %.        Intake/Output Summary (Last 24 hours) at 02/23/2019 0806 Last data filed at 02/23/2019 0700 Gross per 24 hour  Intake 2021.57 ml  Output 2010 ml  Net 11.57 ml   Filed Weights   02/21/19 1910  Weight: 80.6 kg    Examination: General: Elderly man, no acute distress, falls  asleep easily HENT: left surgical site intact, drain in place.  Lungs: Nasal airway removed, snoring when asleep, decreased breath sounds bilateral Cardiovascular: RRR Abdomen: SND Extremities: no edema, good cap refill Neuro: Easily arouses, follows commands, strong grip on both arms, was able to tell me his name and that he is at [facility] GU: foley in place clear urine  Resolved Hospital Problem list     Assessment & Plan:  85 year old s/p evac of subdural hem with seizures  1) seizure- PLAN: --Keppra and Dilantin per neurology --ativan if additional seizure activity  Subdural hematoma-status post evacuation, ?  Discontinue drain-defer to neurosurgery   2) Hypoxia/OSA -able to protect airway PLAN: --Resume CPAP at bedtime --DC nasal airway  -Okay to mobilize , advance p.o. after swallow evaluation  Best practice:  Diet: NPO for now Pain/Anxiety/Delirium protocol (if indicated): n/a VAP protocol (if indicated): n/a DVT prophylaxis: SCD GI prophylaxis: HOB >30, PPI Glucose control: SSI Mobility: Out of bed Code Status: full Family Communication: Per primary team Disposition: 4N ICU  Kara Mead MD. FCCP. [facility] Pager [phone] If no response call [phone]   02/23/2019

## 2019-02-23 NOTE — Progress Notes (Signed)
Assisted tele visit to patient with wife and son.  Lenox Ahr, RN

## 2019-02-23 NOTE — Evaluation (Signed)
Clinical/Bedside Swallow Evaluation Patient Details  Name: Preston Hill MRN: [id number] Date of Birth: [birth year]  Today's Date: 02/23/2019 Time: SLP Start Time (ACUTE ONLY): 1149 SLP Stop Time (ACUTE ONLY): 1204 SLP Time Calculation (min) (ACUTE ONLY): 15 min  Past Medical History:  Past Medical History:  Diagnosis Date  . Arthritis   . Cancer ([place])    Skin Cancer  . Chronic kidney disease   . Hypertension   . Occasional tremors   . Sleep apnea    OSA---USE C-PAP  . Subdural hematoma ([place]) 02/21/2019  . Tinnitus of both ears    Past Surgical History:  Past Surgical History:  Procedure Laterality Date  . BURR HOLE Left 02/21/2019   Procedure: BURR HOLES, Placement of Subdural drain;  Surgeon: Earnie Larsson, MD;  Location: [place];  Service: Neurosurgery;  Laterality: Left;  . COLONOSCOPY  3817/2006  . COLONOSCOPY WITH PROPOFOL N/A 02/01/2016   Procedure: COLONOSCOPY WITH PROPOFOL;  Surgeon: Robert Bellow, MD;  Location: [facility] ENDOSCOPY;  Service: Endoscopy;  Laterality: N/A;  . EYE SURGERY Right    Cataract Extraction with IOL  . HERNIA REPAIR Left 1964   Inguinal Hernia Repair  . KNEE ARTHROSCOPY Left   . KNEE ARTHROSCOPY Right 06/26/2017   Procedure: ARTHROSCOPY KNEE WITH DEBRIDEMENT AND LOOSE BODY REMOVAL;  Surgeon: Corky Mull, MD;  Location: [place];  Service: Orthopedics;  Laterality: Right;  . KNEE ARTHROSCOPY WITH MEDIAL MENISECTOMY Left 05/02/2017   Procedure: KNEE ARTHROSCOPY WITH MEDIAL MENISECTOMY;  Surgeon: Corky Mull, MD;  Location: [facility];  Service: Orthopedics;  Laterality: Left;  . KNEE ARTHROSCOPY WITH MENISCAL REPAIR  05/02/2017   Procedure: KNEE ARTHROSCOPY WITH MENISCAL REPAIR;  Surgeon: Corky Mull, MD;  Location: [facility] OR[facility];  Service: Orthopedics;;  . SEPTOPLASTY  2003  . uvul  2004   repair   HPI:  85 y/o who presented with increasing R-sided weakness from large L chronic subdural hematoma s/p evacuation 5/23. He developed seizure  activity post-op and also on 5/24. PMH: Arthritis, Cancer, CKD, HTN, Occasional tremors, OSA, and Tinnitus of both ears.   Assessment / Plan / Recommendation Clinical Impression  Pt has signs of a mild dysphagia, further exacerbated by current mentation, at times drifting off to sleep with boluses in his mouth. Frequent eructation and throat clearing could be suggestive of an esophageal component as well, although intermittent coughing with thin liquids increases concern for aspiration. Multiple subswallows are noted with the outward appearance of piecemeal swallowing, with oral residue noted when he would occasionally open his mouth in between swallows, although this could also be related to pharyngeal issues in light of of other clinical symptoms. Recommend allowing ice chips after oral care and meds crushed in puree when alert. Will f/u for readiness to start PO diet, although pt may also benefit from MBS if clincial symptoms persist as his alertness improves. SLP Visit Diagnosis: Dysphagia, unspecified (R13.10)    Aspiration Risk  Mild aspiration risk;Moderate aspiration risk    Diet Recommendation NPO except meds;Ice chips PRN after oral care   Medication Administration: Crushed with puree    Other  Recommendations Oral Care Recommendations: Oral care QID   Follow up Recommendations (tba)      Frequency and Duration min 2x/week  2 weeks       Prognosis Prognosis for Safe Diet Advancement: Good Barriers to Reach Goals: Cognitive deficits      Swallow Study   General HPI: 85 y/o who presented with  increasing R-sided weakness from large L chronic subdural hematoma s/p evacuation 5/23. He developed seizure activity post-op and also on 5/24. PMH: Arthritis, Cancer, CKD, HTN, Occasional tremors, OSA, and Tinnitus of both ears. Type of Study: Bedside Swallow Evaluation Previous Swallow Assessment: none in chart Diet Prior to this Study: NPO Temperature Spikes Noted: No Respiratory  Status: Nasal cannula History of Recent Intubation: (for procedure only on 5/23) Oral Cavity Assessment: Dry Oral Care Completed by SLP: No Oral Cavity - Dentition: Adequate natural dentition Vision: Functional for self-feeding Self-Feeding Abilities: Needs assist Patient Positioning: Upright in bed Baseline Vocal Quality: Normal Volitional Swallow: Able to elicit    Oral/Motor/Sensory Function Overall Oral Motor/Sensory Function: (limited command following)   Ice Chips Ice chips: Within functional limits Presentation: Spoon   Thin Liquid Thin Liquid: Impaired Presentation: Cup;Self Fed;Straw Pharyngeal  Phase Impairments: Multiple swallows;Cough - Immediate    Nectar Thick Nectar Thick Liquid: Not tested   Honey Thick Honey Thick Liquid: Not tested   Puree Puree: Impaired Presentation: Spoon Oral Phase Functional Implications: Prolonged oral transit;Oral residue Pharyngeal Phase Impairments: Multiple swallows;Throat Clearing - Immediate   Solid     Solid: Not tested      Venita Sheffield Melven Stockard 02/23/2019,1:04 PM  Pollyann Glen, M.A. [place] [facility] Environmental education officer [phone] Office [phone]

## 2019-02-23 NOTE — Evaluation (Signed)
Physical Therapy Evaluation Patient Details Name: Preston Hill MRN: [id number] DOB: Nov 11, 1933 Today's Date: 02/23/2019   History of Present Illness  Patient is a 85 y/o male who presents with progressive RUE/LE weakness. Found to left sided SDH with mass effect s/p burr hole evacuation and placement of subdural drain 5/23. Postoperatively had seizures x3. PMH includes HTN, CKD, skin ca.   Clinical Impression  Patient presents with generalized weakness, lethargy, impaired balance, language deficits, incoordination RUE and impaired mobility s/p above. Pt independent PTA and lives with wife. Pt with difficulty providing PLOF due to aphasia. Tolerated bed mobility, transfers and short distance ambulation with Min-Mod A for balance/safety. Some difficulty with initiating movement of RLE for gait. Pt laughs to mask his language deficits and appears frustrated. Recommend SLP consult. Rn to place. Would benefit from CIR to maximize independence and mobility prior to return home. Will follow acutely.    Follow Up Recommendations CIR;Supervision for mobility/OOB;Supervision/Assistance - 24 hour    Equipment Recommendations  Other (comment)(TBA)    Recommendations for Other Services Rehab consult     Precautions / Restrictions Precautions Precautions: Fall Restrictions Weight Bearing Restrictions: No      Mobility  Bed Mobility Overal bed mobility: Needs Assistance Bed Mobility: Rolling;Sidelying to Sit Rolling: Min guard Sidelying to sit: Mod assist;HOB elevated       General bed mobility comments: Cues to reach for rail; assist with trunk to get to EOB.   Transfers Overall transfer level: Needs assistance Equipment used: 2 person hand held assist Transfers: Sit to/from Stand Sit to Stand: Min assist         General transfer comment: Assist to power to standing with posterior lean initially. + dizziness. BP stable.  Ambulation/Gait Ambulation/Gait assistance: Mod assist;+2  physical assistance Gait Distance (Feet): 7 Feet Assistive device: 2 person hand held assist Gait Pattern/deviations: Step-through pattern;Decreased stride length;Shuffle;Decreased step length - left;Decreased step length - right Gait velocity: decreased   General Gait Details: Slow, unsteady gait with difficulty initiating RLE at first. posterior lean requiring assist for balance and to advance LEs.   Stairs            Wheelchair Mobility    Modified Rankin (Stroke Patients Only) Modified Rankin (Stroke Patients Only) Pre-Morbid Rankin Score: No significant disability Modified Rankin: Moderately severe disability     Balance Overall balance assessment: Needs assistance Sitting-balance support: Feet supported;No upper extremity supported Sitting balance-Leahy Scale: Fair     Standing balance support: During functional activity Standing balance-Leahy Scale: Poor Standing balance comment: Requires external support for standing balance.                             Pertinent Vitals/Pain Pain Assessment: Faces Faces Pain Scale: Hurts a little bit Pain Location: "my bladder" Pain Descriptors / Indicators: Grimacing Pain Intervention(s): Monitored during session;Repositioned    Home Living Family/patient expects to be discharged to:: Private residence Living Arrangements: Spouse/significant other Available Help at Discharge: Family;Available 24 hours/day Type of Home: House Home Access: Stairs to enter Entrance Stairs-Rails: Right Entrance Stairs-Number of Steps: 4 Home Layout: Two level Home Equipment: None      Prior Function Level of Independence: Independent         Comments: Drives, independent PTA per chart. Difficult historian secondary to aphasia.     Hand Dominance   Dominant Hand: Right    Extremity/Trunk Assessment   Upper Extremity Assessment Upper Extremity Assessment: Defer to  OT evaluation;RUE deficits/detail RUE Coordination:  decreased fine motor;decreased gross motor    Lower Extremity Assessment Lower Extremity Assessment: Generalized weakness    Cervical / Trunk Assessment Cervical / Trunk Assessment: Kyphotic  Communication   Communication: Expressive difficulties  Cognition Arousal/Alertness: Lethargic Behavior During Therapy: WFL for tasks assessed/performed Overall Cognitive Status: Difficult to assess Area of Impairment: Orientation;Attention;Following commands;Problem solving                 Orientation Level: Disoriented to;Time Current Attention Level: Sustained   Following Commands: Follows one step commands with increased time     Problem Solving: Difficulty sequencing;Decreased initiation;Slow processing;Requires verbal cues;Requires tactile cues General Comments: Difficulty with stating time, perseverting on "September" which is his birth month. When told it was a holiday, stated "Labor day." Difficulty answering questions likely due to aphasia?       General Comments General comments (skin integrity, edema, etc.): HR ranged from 100-145 bpm A-fib. Sp02 not a great wave form reading from 80-90s on 4L/min 02. Dropped to 2L for session, placed on 3L/min 02 post session as pt falling asleep.    Exercises     Assessment/Plan    PT Assessment Patient needs continued PT services  PT Problem List Decreased strength;Decreased balance;Decreased cognition;Pain;Cardiopulmonary status limiting activity;Decreased knowledge of use of DME;Decreased mobility;Decreased activity tolerance;Decreased coordination       PT Treatment Interventions Functional mobility training;Balance training;Patient/family education;Gait training;Therapeutic activities;Therapeutic exercise;Stair training;Neuromuscular re-education;DME instruction    PT Goals (Current goals can be found in the Care Plan section)  Acute Rehab PT Goals Patient Stated Goal: none stated PT Goal Formulation: Patient unable to  participate in goal setting Time For Goal Achievement: 03/09/19 Potential to Achieve Goals: Good    Frequency Min 4X/week   Barriers to discharge Inaccessible home environment stairs to enter home    Co-evaluation PT/OT/SLP Co-Evaluation/Treatment: Yes Reason for Co-Treatment: Necessary to address cognition/behavior during functional activity;Complexity of the patient's impairments (multi-system involvement);For patient/therapist safety;To address functional/ADL transfers PT goals addressed during session: Mobility/safety with mobility;Strengthening/ROM;Balance         AM-PAC PT "6 Clicks" Mobility  Outcome Measure Help needed turning from your back to your side while in a flat bed without using bedrails?: A Little Help needed moving from lying on your back to sitting on the side of a flat bed without using bedrails?: A Lot Help needed moving to and from a bed to a chair (including a wheelchair)?: A Lot Help needed standing up from a chair using your arms (e.g., wheelchair or bedside chair)?: A Little Help needed to walk in hospital room?: A Lot Help needed climbing 3-5 steps with a railing? : Total 6 Click Score: 13    End of Session Equipment Utilized During Treatment: Gait belt Activity Tolerance: Patient tolerated treatment well Patient left: in chair;with call bell/phone within reach;with chair alarm set Nurse Communication: Mobility status PT Visit Diagnosis: Unsteadiness on feet (R26.81);Muscle weakness (generalized) (M62.81);Difficulty in walking, not elsewhere classified (R26.2)    Time: 7517-0017 PT Time Calculation (min) (ACUTE ONLY): 25 min   Charges:   PT Evaluation $PT Eval Moderate Complexity: 1 Mod         Wray Kearns, PT, DPT Acute Rehabilitation Services Pager [phone] Office [place] 02/23/2019, 1:11 PM

## 2019-02-23 NOTE — Evaluation (Signed)
Occupational Therapy Evaluation Patient Details Name: Preston Hill MRN: [id number] DOB: [birth year] Today's Date: 02/23/2019    History of Present Illness Patient is a 86 y/o male who presents with progressive RUE/LE weakness. Found to left sided SDH with mass effect s/p burr hole evacuation and placement of subdural drain 5/23. Postoperatively had seizures x3. PMH includes HTN, CKD, skin ca.    Clinical Impression   This 85 yo male admitted with above presents to acute OT with decreased balance, decreased communication, decreased safety awareness, decreased mobility, decreased problem solving, decreased vision all affecting his safety and independence with basic ADLs. He will benefit from acute OT with follow up on CIR to work back towards his PLOF of independent.    Follow Up Recommendations  CIR;Supervision/Assistance - 24 hour    Equipment Recommendations  Other (comment)(TBD next venue)       Precautions / Restrictions Precautions Precautions: Fall Restrictions Weight Bearing Restrictions: No      Mobility Bed Mobility Overal bed mobility: Needs Assistance Bed Mobility: Rolling;Sidelying to Sit Rolling: Min guard Sidelying to sit: Mod assist;HOB elevated       General bed mobility comments: Cues to reach for rail; assist with trunk to get to EOB.   Transfers Overall transfer level: Needs assistance Equipment used: 2 person hand held assist Transfers: Sit to/from Stand Sit to Stand: Min assist         General transfer comment: Assist to power to standing with posterior lean initially. + dizziness. BP stable.    Balance Overall balance assessment: Needs assistance Sitting-balance support: Feet supported;No upper extremity supported Sitting balance-Leahy Scale: Fair     Standing balance support: During functional activity Standing balance-Leahy Scale: Poor Standing balance comment: Requires external support for standing balance.                            ADL either performed or assessed with clinical judgement   ADL Overall ADL's : Needs assistance/impaired Eating/Feeding: NPO   Grooming: Moderate assistance Grooming Details (indicate cue type and reason): supported sitting Upper Body Bathing: Moderate assistance Upper Body Bathing Details (indicate cue type and reason): supported sitting Lower Body Bathing: Maximal assistance Lower Body Bathing Details (indicate cue type and reason): +2 min A sit<>stand Upper Body Dressing : Moderate assistance Upper Body Dressing Details (indicate cue type and reason): supported sitting Lower Body Dressing: Maximal assistance Lower Body Dressing Details (indicate cue type and reason): +2 min A sit<>stand Toilet Transfer: Minimal assistance;+2 for physical assistance;Stand-pivot Toilet Transfer Details (indicate cue type and reason): Bil HHA Toileting- Clothing Manipulation and Hygiene: Total assistance Toileting - Clothing Manipulation Details (indicate cue type and reason): +2 min A sit<>stand             Vision Baseline Vision/History: Wears glasses Wears Glasses: Reading only Patient Visual Report: No change from baseline Vision Assessment?: Vision impaired- to be further tested in functional context            Pertinent Vitals/Pain Pain Assessment: Faces Faces Pain Scale: Hurts a little bit Pain Location: "my bladder"-- I need to pee Pain Descriptors / Indicators: Grimacing Pain Intervention(s): Monitored during session;Repositioned     Hand Dominance Right   Extremity/Trunk Assessment Upper Extremity Assessment Upper Extremity Assessment: Generalized weakness RUE Coordination: decreased fine motor;decreased gross motor   Lower Extremity Assessment Lower Extremity Assessment: Generalized weakness   Cervical / Trunk Assessment Cervical / Trunk Assessment: Kyphotic   Communication Communication Communication:  No difficulties(pta)   Cognition Arousal/Alertness:  Lethargic Behavior During Therapy: WFL for tasks assessed/performed Overall Cognitive Status: Difficult to assess Area of Impairment: Orientation;Attention;Following commands;Problem solving                 Orientation Level: Disoriented to;Time Current Attention Level: Sustained   Following Commands: Follows one step commands with increased time     Problem Solving: Difficulty sequencing;Decreased initiation;Slow processing;Requires verbal cues;Requires tactile cues General Comments: Difficulty with stating time, perseverting on "September" which is his birth month. When told it was a holiday, stated "Labor day." Difficulty answering questions likely due to aphasia?    General Comments  HR ranged from 100-145 bpm A-fib. Sp02 not a great wave form reading from 80-90s on 4L/min 02. Dropped to 2L for session, placed on 3L/min 02 post session as pt falling asleep.            Home Living Family/patient expects to be discharged to:: Inpatient rehab Living Arrangements: Spouse/significant other Available Help at Discharge: Family;Available 24 hours/day Type of Home: House Home Access: Stairs to enter [employer] of Steps: 4 Entrance Stairs-Rails: Right Home Layout: Two level Alternate Level Stairs-Number of Steps: 1 flight Alternate Level Stairs-Rails: Right     Bathroom Toilet: Standard     Home Equipment: None          Prior Functioning/Environment Level of Independence: Independent        Comments: Drives, independent PTA per chart. Difficult historian secondary to aphasia.        OT Problem List: Impaired balance (sitting and/or standing);Pain;Impaired vision/perception;Decreased safety awareness;Decreased cognition;Decreased coordination      OT Treatment/Interventions: Self-care/ADL training;Balance training;DME and/or AE instruction;Patient/family education;Cognitive remediation/compensation;Therapeutic activities    OT Goals(Current goals  can be found in the care plan section) Acute Rehab OT Goals Patient Stated Goal: none stated OT Goal Formulation: With patient Time For Goal Achievement: 03/09/19 Potential to Achieve Goals: Good  OT Frequency: Min 3X/week           Co-evaluation PT/OT/SLP Co-Evaluation/Treatment: Yes Reason for Co-Treatment: Necessary to address cognition/behavior during functional activity;Complexity of the patient's impairments (multi-system involvement);For patient/therapist safety;To address functional/ADL transfers PT goals addressed during session: Mobility/safety with mobility;Balance;Strengthening/ROM OT goals addressed during session: ADL's and self-care;Strengthening/ROM      AM-PAC OT "6 Clicks" Daily Activity     Outcome Measure Help from another person eating meals?: Total Help from another person taking care of personal grooming?: A Lot Help from another person toileting, which includes using toliet, bedpan, or urinal?: A Lot Help from another person bathing (including washing, rinsing, drying)?: A Lot Help from another person to put on and taking off regular upper body clothing?: A Lot Help from another person to put on and taking off regular lower body clothing?: A Lot 6 Click Score: 11   End of Session Equipment Utilized During Treatment: Gait belt Nurse Communication: Mobility status  Activity Tolerance: Patient limited by lethargy Patient left: in chair;with call bell/phone within reach;with chair alarm set  OT Visit Diagnosis: Unsteadiness on feet (R26.81);Other abnormalities of gait and mobility (R26.89);Muscle weakness (generalized) (M62.81);Other symptoms and signs involving cognitive function;Cognitive communication deficit (R41.841);Pain Symptoms and signs involving cognitive functions: (s/p burr hole with post op seizures) Pain - part of body: (bladder--needed to urinate (condom cath))                Time: 1220-1245 OT Time Calculation (min): 25 min Charges:  OT  General Charges $OT Visit: 1 Visit OT Evaluation $  OT Eval Moderate Complexity: 1 Mod  Golden Circle, OTR/L Acute [employer] Pager [phone] Office [phone]     Almon Register 02/23/2019, 3:11 PM

## 2019-02-24 ENCOUNTER — Inpatient Hospital Stay (HOSPITAL_COMMUNITY): Payer: Medicare HMO

## 2019-02-24 DIAGNOSIS — G9340 Encephalopathy, unspecified: Secondary | ICD-10-CM

## 2019-02-24 LAB — BASIC METABOLIC PANEL
Anion gap: 17 — ABNORMAL HIGH (ref 5–15)
BUN: 15 mg/dL (ref 8–23)
CO2: 21 mmol/L — ABNORMAL LOW (ref 22–32)
Calcium: 9.2 mg/dL (ref 8.9–10.3)
Chloride: 102 mmol/L (ref 98–111)
Creatinine, Ser: 0.89 mg/dL (ref 0.61–1.24)
GFR calc Af Amer: >60 mL/min (ref >60)
GFR calc non Af Amer: >60 mL/min (ref >60)
Glucose, Bld: 84 mg/dL (ref 70–99)
Potassium: 3 mmol/L — ABNORMAL LOW (ref 3.5–5.1)
Sodium: 140 mmol/L (ref 135–145)

## 2019-02-24 LAB — MAGNESIUM: Magnesium: 1.6 mg/dL — ABNORMAL LOW (ref 1.7–2.4)

## 2019-02-24 MED ORDER — POTASSIUM CHLORIDE 20 MEQ PO PACK: 40.0000 meq | PACK | Freq: Two times a day (BID) | ORAL | Status: DC

## 2019-02-24 MED ORDER — MAGNESIUM SULFATE 2 GM/50ML IV SOLN
2.0000 g | Freq: Once | INTRAVENOUS | Status: AC
  Administered 2019-02-24: 11:00:00 2 g via INTRAVENOUS
  Filled 2019-02-24: qty 50

## 2019-02-24 MED ORDER — POTASSIUM CHLORIDE 20 MEQ PO PACK
20.0000 meq | PACK | Freq: Once | ORAL | Status: AC
  Administered 2019-02-24: 17:00:00 20 meq via ORAL
  Filled 2019-02-24: qty 1

## 2019-02-24 MED ORDER — POTASSIUM CHLORIDE 20 MEQ PO PACK
40.0000 meq | PACK | Freq: Once | ORAL | Status: AC
  Administered 2019-02-24: 40 meq via ORAL
  Filled 2019-02-24: qty 2

## 2019-02-24 NOTE — Progress Notes (Signed)
Patient awake and alert.  He is oriented and appropriate.  He is having no headache.  He said no further seizures.  Drain is out.  Motor and sensory exam intact.  Overall doing well.  Slowly begin to mobilize.  Undergoing swallow screen right now if he passes that he may have a diet.  Continue current antiepileptic medication.

## 2019-02-24 NOTE — Progress Notes (Signed)
PCCM Interval Note   K 3.0 and Mag 1.6 Normal sCr   P: Replete with KCL 40 meq now- via powder/ thicken, 20 meq this afternoon Mag 2gm now IV Repeat BMP in am     Kennieth Rad, MSN, AGACNP-BC [facility] Pgr: [phone] or if no answer [phone] 02/24/2019, 10:07 AM

## 2019-02-24 NOTE — Progress Notes (Signed)
  Speech Language Pathology Treatment: Dysphagia  Patient Details Name: Preston Hill MRN: [id number] DOB: Dec 16, 1933 Today's Date: 02/24/2019 Time: 8527-7824 SLP Time Calculation (min) (ACUTE ONLY): 24 min  Assessment / Plan / Recommendation Clinical Impression  Pt continues to exhibit s/s aspiration with immediate and consistent throat clearing throughout session and for next 30 minutes with coughing during speech-language-cognitive evaluation. Suspect he may have mucous that he is unable to clear although given stroke cannot rule out aspiration without instrumental testing. He denies history of refux. Safest approach is for MBS which is scheduled for today at 1400.   HPI HPI: 85 y/o who presented with increasing R-sided weakness from large L chronic subdural hematoma s/p evacuation 5/23. He developed seizure activity post-op and also on 5/24. PMH: Arthritis, Cancer, CKD, HTN, Occasional tremors, OSA, and Tinnitus of both ears.      SLP Plan  New goals to be determined pending instrumental study       Recommendations  Diet recommendations: Other(comment)(NPO excpet meds) Medication Administration: Crushed with puree                Oral Care Recommendations: Oral care QID Follow up Recommendations: Inpatient Rehab SLP Visit Diagnosis: Dysphagia, unspecified (R13.10) Plan: New goals to be determined pending instrumental study                      Houston Siren 02/24/2019, 12:26 PM  Orbie Pyo Colvin Caroli.Ed Risk analyst [phone] Office [phone]

## 2019-02-24 NOTE — Progress Notes (Addendum)
NAME:  Preston Hill, MRN:  [id number], DOB:  04-Aug-1934, LOS: 3 ADMISSION DATE:  02/21/2019, CONSULTATION DATE:  02/22/2019 REFERRING MD:  Dr. Annette Stable, CHIEF COMPLAINT:  seizures   Brief History   85 y/o with subdural hematoma, presented with progressive weakness; taken to OR 5/23 for evac. Developed seizure activity post procedure and also on 5/24  Past Medical History  He  has a past medical history of Arthritis, Cancer ([place]), Chronic kidney disease, Hypertension, Occasional tremors, Sleep apnea, Subdural hematoma ([place]) (02/21/2019), and Tinnitus of both ears.  Significant Hospital Events   5/23-OR for subdural evacuation 5/24-seizures, including one this a.m. at ~0800>>  keppra load.   Consults:  PCCM neurology  Procedures:    Significant Diagnostic Tests:  5/24 0200 CT head: IMPRESSION: 1. Interval evacuation of left-sided subdural hematoma with drain placement. The residual left-sided subdural hematoma measures up to 8 mm in thickness. Expected postsurgical changes related to left-sided burr holes. 2. Decreased mass effect with 5 mm left-to-right midline shift and improved patency of left lateral ventricle. 3. New small volume of subarachnoid hemorrhage. 4. Stable right convexity subdural hematoma.  EEG 5/24 cortical irritability on left  Micro Data:  N/a  Antimicrobials:  Post op ancef x2 doses   Interim history/subjective:  No issues or further clinical seizures Refused CPAP last night due to comfort No complaints per patient other than generalized weakness  Objective   Blood pressure 125/83, pulse 76, temperature 99.5 F (37.5 C), temperature source Axillary, resp. rate 14, height 5\' 6"  (1.676 m), weight 80.6 kg, SpO2 98 %.        Intake/Output Summary (Last 24 hours) at 02/24/2019 0848 Last data filed at 02/24/2019 0800 Gross per 24 hour  Intake 2139.2 ml  Output 2700 ml  Net -560.8 ml   Filed Weights   02/21/19 1910  Weight: 80.6 kg     Examination:    General:  Elderly male sitting in chair in NAD HEENT: MM pink/moist, pupils 3/reactive, anicertic, stapled left scalp  Neuro: Awake, slower but answers orientation x 3, MAE, generalized weakness CV: IR RR, no murmur PULM: even/non-labored, lungs bilaterally clear ZG:YFVC, non-tender, bs active Extremities: warm/dry, no edema  Skin: no rashes  Resolved Hospital Problem list    Assessment & Plan:  85 year old s/p evac of subdural hematoma with seizures  1) Seizure PLAN: --Keppra and Dilantin per neurology --ativan if additional seizure activity - ongoing EEGs per NSGY  2) Subdural hematoma-status post evacuation PLAN: Per NSGY  2) Hypoxia/OSA -able to protect airway Mild aspiration risk  PLAN: --Resume CPAP at bedtime as tolerated --SLP following, remains NPO except meds  -- wean O2 for sat goal > 94% -- PT/ OT following, encourage pulm hygiene  Hypokalemia P:  Check BMP Replace electrolytes as indicated   Nothing further to add.  PCCM will sign off.  Please do not hesitate to call us back if we can be of any further assistance.   Best practice:  Diet: NPO/ SLP following Pain/Anxiety/Delirium protocol (if indicated): n/a VAP protocol (if indicated): n/a DVT prophylaxis: SCD GI prophylaxis: HOB >30, PPI Glucose control: SSI Mobility: Out of bed Code Status: full Family Communication: Per patient.  Patient updated on plan of care Disposition: 4N ICU  Kennieth Rad, MSN, AGACNP-BC [facility] Pgr: [phone] or if no answer [phone] 02/24/2019, 9:51 AM   Independently examined pt, evaluated data & formulated above care plan with NP  Developed seizures post evacuation of subdural  hematoma and acute encephalopathy. He is able to protect his airway. Improving and needs PT. He is on Keppra and Dilantin per neurology Hypokalemia and hypomagnesemia will be repleted. He does need to wear his CPAP during sleep and this will  be reinforced. PCCM available as needed  Ramere Downs V. Elsworth Soho MD

## 2019-02-24 NOTE — Progress Notes (Signed)
On rounds Dr. Annette Stable made aware pt was currently in a fib. Blood pressure WNL. No new orders received will continue to monitor pt.

## 2019-02-24 NOTE — Progress Notes (Signed)
Physical Therapy Treatment Patient Details Name: Preston Hill MRN: [id number] DOB: [birth year] Today's Date: 02/24/2019    History of Present Illness Patient is a 85 y/o male who presents with progressive RUE/LE weakness. Found to left sided SDH with mass effect s/p burr hole evacuation and placement of subdural drain 5/23. Postoperatively had seizures x3. PMH includes HTN, CKD, skin ca.     PT Comments    Patient progressing well towards PT goals. Improved ambulation distance today with Min A for balance/safety. Pt with very slow and guarded gait. HR ranged from 100-125 bpm, Sp02 stable on 3L/min 02. Pt continues to be confused- A&Ox2. Speech seems slightly improved today. Continues to be sleepy and lethargy- question seizure medication causing this? Would be a great CIR candidate. Will continue to follow and progress.    Follow Up Recommendations  CIR;Supervision for mobility/OOB;Supervision/Assistance - 24 hour     Equipment Recommendations  Other (comment)(TBA)    Recommendations for Other Services       Precautions / Restrictions Precautions Precautions: Fall Restrictions Weight Bearing Restrictions: No    Mobility  Bed Mobility Overal bed mobility: Needs Assistance Bed Mobility: Rolling;Sidelying to Sit Rolling: Min assist Sidelying to sit: Mod assist;HOB elevated       General bed mobility comments: Cues to reach for rail, seems to have some difficulties seeing rail overshooting it; assist with trunk to get to EOB.   Transfers Overall transfer level: Needs assistance Equipment used: Rolling walker (2 wheeled) Transfers: Sit to/from Stand Sit to Stand: Mod assist         General transfer comment: Assist to power to standing with cues for hand placement/technique. Transferred to chair post ambulation.  Ambulation/Gait Ambulation/Gait assistance: Min assist Gait Distance (Feet): 26 Feet Assistive device: Rolling walker (2 wheeled) Gait Pattern/deviations:  Step-through pattern;Shuffle;Decreased stride length Gait velocity: decreased   General Gait Details: Very slow, guarded gait. Sp02 stable on 3L/min 02. HR ranged from 100-125 bpm. Fatigues.    Stairs             Wheelchair Mobility    Modified Rankin (Stroke Patients Only) Modified Rankin (Stroke Patients Only) Pre-Morbid Rankin Score: No significant disability Modified Rankin: Moderately severe disability     Balance Overall balance assessment: Needs assistance Sitting-balance support: Feet supported;No upper extremity supported Sitting balance-Leahy Scale: Fair     Standing balance support: During functional activity Standing balance-Leahy Scale: Poor Standing balance comment: Requires external support for standing balance.                            Cognition Arousal/Alertness: Lethargic Behavior During Therapy: WFL for tasks assessed/performed Overall Cognitive Status: Impaired/Different from baseline Area of Impairment: Orientation;Attention;Following commands;Memory                 Orientation Level: Disoriented to;Place;Time Current Attention Level: Sustained Memory: Decreased short-term memory Following Commands: Follows one step commands with increased time     Problem Solving: Slow processing;Requires verbal cues;Difficulty sequencing General Comments: "mental institution," "September," Knows they drilled into his head. Laughs at questions when asked, likely masking not knowing the answer but does report he has a sense of humor normally.       Exercises      General Comments        Pertinent Vitals/Pain Pain Assessment: Faces Faces Pain Scale: Hurts little more Pain Location: back Pain Descriptors / Indicators: Grimacing;Sore Pain Intervention(s): Monitored during session;Repositioned    Home  Living                      Prior Function            PT Goals (current goals can now be found in the care plan  section) Progress towards PT goals: Progressing toward goals    Frequency    Min 4X/week      PT Plan Current plan remains appropriate    Co-evaluation              AM-PAC PT "6 Clicks" Mobility   Outcome Measure  Help needed turning from your back to your side while in a flat bed without using bedrails?: A Little Help needed moving from lying on your back to sitting on the side of a flat bed without using bedrails?: A Lot Help needed moving to and from a bed to a chair (including a wheelchair)?: A Lot Help needed standing up from a chair using your arms (e.g., wheelchair or bedside chair)?: A Lot Help needed to walk in hospital room?: A Little Help needed climbing 3-5 steps with a railing? : A Lot 6 Click Score: 14    End of Session Equipment Utilized During Treatment: Gait belt Activity Tolerance: Patient tolerated treatment well Patient left: in chair;with call bell/phone within reach;with chair alarm set Nurse Communication: Mobility status PT Visit Diagnosis: Unsteadiness on feet (R26.81);Muscle weakness (generalized) (M62.81);Difficulty in walking, not elsewhere classified (R26.2)     Time: 0370-4888 PT Time Calculation (min) (ACUTE ONLY): 25 min  Charges:  $Gait Training: 8-22 mins $Therapeutic Activity: 8-22 mins                     Wray Kearns, [place], DPT Acute Rehabilitation Services Pager [phone] Office [place] 02/24/2019, 9:55 AM

## 2019-02-24 NOTE — Progress Notes (Signed)
Modified Barium Swallow Progress Note  Patient Details  Name: SANJEEV MAIN MRN: [id number] Date of Birth: 1934-05-20  Today's Date: 02/24/2019  Modified Barium Swallow completed.  Full report located under Chart Review in the Imaging Section.  Brief recommendations include the following:  Clinical Impression  Pt exhibited functional oral and pharyngeal swallowing ability with suspected primary esophageal dysphagia. He did exhibit mild vallecular residue with solid texture with minimal impact to function. Timing and protective features of pharyngeal/laryngeal swallow were intact without penetration or aspiration although he continued to throat clear. Esophagus scanned revealing slower transit through lower esophageal sphincter which may be the cause or contribute to frequent throat clearing. Recommend regular texture, thin liquids, pills with water and esophageal precautions. ST will continue to follow short term for education re: esophageal strategies .   Swallow Evaluation Recommendations       SLP Diet Recommendations: Regular solids;Thin liquid   Liquid Administration via: Cup;Straw   Medication Administration: Whole meds with liquid   Supervision: Patient able to self feed   Compensations: Small sips/bites;Slow rate   Postural Changes: Seated upright at 90 degrees;Remain semi-upright after after feeds/meals (Comment)   Oral Care Recommendations: Oral care BID        Houston Siren 02/24/2019,5:06 PM  Orbie Pyo [place].Ed Risk analyst [phone] Office [phone]

## 2019-02-24 NOTE — Evaluation (Signed)
Speech Language Pathology Evaluation Patient Details Name: Preston Hill MRN: [id number] DOB: 1934-07-23 Today's Date: 02/24/2019 Time: 6301-6010 SLP Time Calculation (min) (ACUTE ONLY): 24 min  Problem List:  Patient Active Problem List   Diagnosis Date Noted  . Subdural hematoma ([employer]) 02/21/2019  . Personal history of other malignant neoplasm of skin 05/20/2018  . Bilateral leg edema 05/14/2018  . Injury of tendon of long head of left biceps 03/31/2018  . Tremor 11/06/2017  . First degree AV block 07/09/2017  . Loose body of right knee 07/03/2017  . Primary osteoarthritis of right knee 07/03/2017  . Bradycardia 06/20/2017  . Primary osteoarthritis of left knee 03/22/2017  . Encounter for screening colonoscopy 12/20/2015  . OSA (obstructive sleep apnea) 09/23/2014  . Abnormal ECG 02/26/2014  . Essential hypertension 02/26/2014  . Hyperlipidemia, mixed 02/26/2014   Past Medical History:  Past Medical History:  Diagnosis Date  . Arthritis   . Cancer ([place])    Skin Cancer  . Chronic kidney disease   . Hypertension   . Occasional tremors   . Sleep apnea    OSA---USE C-PAP  . Subdural hematoma ([place]) 02/21/2019  . Tinnitus of both ears    Past Surgical History:  Past Surgical History:  Procedure Laterality Date  . BURR HOLE Left 02/21/2019   Procedure: BURR HOLES, Placement of Subdural drain;  Surgeon: Earnie Larsson, MD;  Location: [facility];  Service: Neurosurgery;  Laterality: Left;  . COLONOSCOPY  3817/2006  . COLONOSCOPY WITH PROPOFOL N/A 02/01/2016   Procedure: COLONOSCOPY WITH PROPOFOL;  Surgeon: Robert Bellow, MD;  Location: [facility]COPY;  Service: Endoscopy;  Laterality: N/A;  . EYE SURGERY Right    Cataract Extraction with IOL  . HERNIA REPAIR Left 1964   Inguinal Hernia Repair  . KNEE ARTHROSCOPY Left   . KNEE ARTHROSCOPY Right 06/26/2017   Procedure: ARTHROSCOPY KNEE WITH DEBRIDEMENT AND LOOSE BODY REMOVAL;  Surgeon: Corky Mull, MD;  Location: [facility];  Service: Orthopedics;  Laterality: Right;  . KNEE ARTHROSCOPY WITH MEDIAL MENISECTOMY Left 05/02/2017   Procedure: KNEE ARTHROSCOPY WITH MEDIAL MENISECTOMY;  Surgeon: Corky Mull, MD;  Location: [facility] ORS;  Service: Orthopedics;  Laterality: Left;  . KNEE ARTHROSCOPY WITH MENISCAL REPAIR  05/02/2017   Procedure: KNEE ARTHROSCOPY WITH MENISCAL REPAIR;  Surgeon: Corky Mull, MD;  Location: [facility] OR[facility];  Service: Orthopedics;;  . SEPTOPLASTY  2003  . uvul  2004   repair   HPI:  85 y/o who presented with increasing R-sided weakness from large L chronic subdural hematoma s/p evacuation 5/23. He developed seizure activity post-op and also on 5/24. PMH: Arthritis, Cancer, CKD, HTN, Occasional tremors, OSA, and Tinnitus of both ears.   Assessment / Plan / Recommendation Clinical Impression  Pt demonstrates mild-moderate cognitive impairments per subtests of MOCA administered (will continue during tx). Visuospatial and thought processing posed challenging. He recalled 1 of 5 words after alloted time and was not stimulable with cues showing deficits in storage and retrieval. Oriented to person, place and situation. He mostly answered questions that were intelligible with appropriate language. ST will follow for cognitive impairments to return to baseline independence.          SLP Assessment  SLP Recommendation/Assessment: Patient needs continued Speech Lanaguage Pathology Services SLP Visit Diagnosis: Cognitive communication deficit (R41.841)    Follow Up Recommendations  Inpatient Rehab    Frequency and Duration min 2x/week  2 weeks      SLP Evaluation Cognition  Overall Cognitive Status: Impaired/Different from baseline Arousal/Alertness: Awake/alert Orientation Level: Oriented to person;Oriented to place;Oriented to situation;Disoriented to time Attention: Sustained Sustained Attention: Appears intact Memory: Impaired Memory Impairment: Storage deficit;Retrieval deficit Awareness:  Impaired Awareness Impairment: Intellectual impairment Problem Solving: Impaired Problem Solving Impairment: Functional basic;Verbal basic Executive Function: Self Monitoring;Self Correcting Safety/Judgment: Impaired       Comprehension  Auditory Comprehension Overall Auditory Comprehension: Appears within functional limits for tasks assessed Visual Recognition/Discrimination Discrimination: Not tested Reading Comprehension Reading Status: (TBA)    Expression Expression Primary Mode of Expression: Verbal Verbal Expression Overall Verbal Expression: Appears within functional limits for tasks assessed Initiation: No impairment Level of Generative/Spontaneous Verbalization: Sentence Repetition: Impaired Level of Impairment: Sentence level(min) Naming: Not tested Pragmatics: No impairment Written Expression Dominant Hand: Right Written Expression: (TBA)   Oral / Motor  Oral Motor/Sensory Function Overall Oral Motor/Sensory Function: Mild impairment Facial ROM: Reduced right Motor Speech Overall Motor Speech: Appears within functional limits for tasks assessed Respiration: Within functional limits Phonation: Normal Resonance: Within functional limits Articulation: Within functional limitis Intelligibility: Intelligible Motor Planning: Witnin functional limits   GO                    Houston Siren 02/24/2019, 2:41 PM  Orbie Pyo [place] M.Ed Risk analyst [phone] Office [phone]

## 2019-02-25 ENCOUNTER — Encounter (HOSPITAL_COMMUNITY): Payer: Self-pay | Admitting: Physical Medicine and Rehabilitation

## 2019-02-25 DIAGNOSIS — R269 Unspecified abnormalities of gait and mobility: Secondary | ICD-10-CM

## 2019-02-25 DIAGNOSIS — S069X0A Unspecified intracranial injury without loss of consciousness, initial encounter: Secondary | ICD-10-CM

## 2019-02-25 DIAGNOSIS — M6281 Muscle weakness (generalized): Secondary | ICD-10-CM

## 2019-02-25 LAB — BASIC METABOLIC PANEL
Anion gap: 12 (ref 5–15)
BUN: 18 mg/dL (ref 8–23)
CO2: 26 mmol/L (ref 22–32)
Calcium: 8.7 mg/dL — ABNORMAL LOW (ref 8.9–10.3)
Chloride: 103 mmol/L (ref 98–111)
Creatinine, Ser: 0.79 mg/dL (ref 0.61–1.24)
GFR calc Af Amer: >60 mL/min (ref >60)
GFR calc non Af Amer: >60 mL/min (ref >60)
Glucose, Bld: 121 mg/dL — ABNORMAL HIGH (ref 70–99)
Potassium: 3 mmol/L — ABNORMAL LOW (ref 3.5–5.1)
Sodium: 141 mmol/L (ref 135–145)

## 2019-02-25 LAB — MAGNESIUM: Magnesium: 2 mg/dL (ref 1.7–2.4)

## 2019-02-25 MED ORDER — POTASSIUM CHLORIDE CRYS ER 20 MEQ PO TBCR
40.0000 meq | EXTENDED_RELEASE_TABLET | Freq: Once | ORAL | Status: AC
  Administered 2019-02-25: 40 meq via ORAL
  Filled 2019-02-25: qty 2

## 2019-02-25 MED ORDER — POTASSIUM CHLORIDE 20 MEQ PO PACK: 40.0000 meq | PACK | Freq: Once | ORAL | Status: DC

## 2019-02-25 MED ORDER — LEVETIRACETAM 500 MG PO TABS
1000.0000 mg | ORAL_TABLET | Freq: Two times a day (BID) | ORAL | Status: DC
  Administered 2019-02-25 – 2019-03-02 (×10): 1000 mg via ORAL
  Filled 2019-02-25 (×10): qty 2

## 2019-02-25 MED ORDER — PHENYTOIN SODIUM EXTENDED 100 MG PO CAPS
100.0000 mg | ORAL_CAPSULE | Freq: Three times a day (TID) | ORAL | Status: DC
  Administered 2019-02-25 – 2019-03-02 (×15): 100 mg via ORAL
  Filled 2019-02-25 (×17): qty 1

## 2019-02-25 MED ORDER — POTASSIUM CHLORIDE 20 MEQ PO PACK
40.0000 meq | PACK | Freq: Once | ORAL | Status: AC
  Administered 2019-02-25: 15:00:00 40 meq via ORAL
  Filled 2019-02-25: qty 2

## 2019-02-25 NOTE — Progress Notes (Signed)
Physical Therapy Treatment Patient Details Name: Preston Hill MRN: [id number] DOB: 05-May-1934 Today's Date: 02/25/2019    History of Present Illness Patient is a 85 y/o male who presents with progressive RUE/LE weakness. Found to left sided SDH with mass effect s/p burr hole evacuation and placement of subdural drain 5/23. Postoperatively had seizures x3. PMH includes HTN, CKD, skin ca.     PT Comments    Pt progressing steadily towards his physical therapy goals. Increased ambulation distance to 60 feet with walker and min assist. Still requiring moderate assistance for transfers. Noted bumping into objects on right side, upon assessment, pt with right temporal visual deficits. Continue to recommend comprehensive inpatient rehab (CIR) for post-acute therapy needs.   Follow Up Recommendations  CIR;Supervision for mobility/OOB;Supervision/Assistance - 24 hour     Equipment Recommendations  Rolling walker with 5" wheels    Recommendations for Other Services       Precautions / Restrictions Precautions Precautions: Fall Restrictions Weight Bearing Restrictions: No    Mobility  Bed Mobility Overal bed mobility: Needs Assistance Bed Mobility: Supine to Sit   Sidelying to sit: Mod assist       General bed mobility comments: ModA for trunk elevation to get EOB  Transfers Overall transfer level: Needs assistance Equipment used: Rolling walker (2 wheeled) Transfers: Sit to/from Stand Sit to Stand: Min assist         General transfer comment: Min assist to power to stand  Ambulation/Gait Ambulation/Gait assistance: Min assist Gait Distance (Feet): 60 Feet Assistive device: Rolling walker (2 wheeled) Gait Pattern/deviations: Step-through pattern;Shuffle;Decreased stride length Gait velocity: decreased Gait velocity interpretation: <1.8 ft/sec, indicate of risk for recurrent falls General Gait Details: Pt with slow, overall fairly steady gait. However, does run into  objects on right side, requiring assistance to correct walker onto straight path   Stairs             Wheelchair Mobility    Modified Rankin (Stroke Patients Only) Modified Rankin (Stroke Patients Only) Pre-Morbid Rankin Score: No significant disability Modified Rankin: Moderately severe disability     Balance Overall balance assessment: Needs assistance Sitting-balance support: Feet supported;No upper extremity supported Sitting balance-Leahy Scale: Fair     Standing balance support: During functional activity;Bilateral upper extremity supported Standing balance-Leahy Scale: Poor Standing balance comment: Requires external support for standing balance.                            Cognition Arousal/Alertness: Awake/alert Behavior During Therapy: Flat affect Overall Cognitive Status: Impaired/Different from baseline Area of Impairment: Attention;Memory                   Current Attention Level: Sustained Memory: Decreased short-term memory       Problem Solving: Slow processing;Requires verbal cues;Difficulty sequencing General Comments: A&Ox4, although did say it was "Monday," instead of "Wednesday."      Exercises      General Comments        Pertinent Vitals/Pain Pain Assessment: Faces Faces Pain Scale: Hurts a little bit Pain Location: headache Pain Descriptors / Indicators: Headache Pain Intervention(s): Monitored during session   Pre Mobility: 152/90 (108) Post mobility: 141/87 (99)    Home Living                      Prior Function            PT Goals (current goals can now be  found in the care plan section) Acute Rehab PT Goals Patient Stated Goal: "garden my tomatoes again." Potential to Achieve Goals: Good Progress towards PT goals: Progressing toward goals    Frequency    Min 4X/week      PT Plan Current plan remains appropriate    Co-evaluation              AM-PAC PT "6 Clicks" Mobility    Outcome Measure  Help needed turning from your back to your side while in a flat bed without using bedrails?: A Little Help needed moving from lying on your back to sitting on the side of a flat bed without using bedrails?: A Lot Help needed moving to and from a bed to a chair (including a wheelchair)?: A Little Help needed standing up from a chair using your arms (e.g., wheelchair or bedside chair)?: A Little Help needed to walk in hospital room?: A Little Help needed climbing 3-5 steps with a railing? : A Lot 6 Click Score: 16    End of Session Equipment Utilized During Treatment: Gait belt Activity Tolerance: Patient tolerated treatment well Patient left: in chair;with call bell/phone within reach;with chair alarm set Nurse Communication: Mobility status PT Visit Diagnosis: Unsteadiness on feet (R26.81);Muscle weakness (generalized) (M62.81);Difficulty in walking, not elsewhere classified (R26.2)     Time: 2426-8341 PT Time Calculation (min) (ACUTE ONLY): 24 min  Charges:  $Gait Training: 8-22 mins $Therapeutic Activity: 8-22 mins                    Ellamae Sia, PT, DPT Acute Rehabilitation Services Pager [phone] Office [phone]    Willy Eddy 02/25/2019, 10:51 AM

## 2019-02-25 NOTE — Consult Note (Signed)
Physical Medicine and Rehabilitation Consult   Reason for Consult: SDH Referring Physician: Dr. Annette Stable    HPI: Preston Hill is a 85 y.o. male with history of HTN, CKD, OSA, tremors who was admitted on 02/21/19 with 2-3 days of HA, tendency to drag foot and decrease in dexterity RUE with balance deficits. He was found to have RUE/RLE weakness and CT head revealed acute on chronic left SDH with left to right shift and effacement of frontal and parietal lobes. He was taken to OR the same day for burr hole evacuation of left frontal and parietal bleed and placement of subdural drain by Dr. Annette Stable. Post op with lip smacking movements concerning for seizures and he was started on dilantin and Keppra. He had generalized seizure with foaming of mouth and decline in MS and continued to be post ictal with LUE movements. He was loaded with Keppra and dose increased to 1000 mg bid on 5/24.  Follow up CT head showed interval evacuation of L-SDH with decrease in mass effect.  Follow up labs reveal ABLA and persistent hypokalemia. His mentation improving but he continues to be limited by right visual deficits with delayed processing and sequencing as well as balance deficits. CIR recommended due to functional decline.   Patient does not remember falls at home prior to admission. Review of Systems  Constitutional: Negative for chills and fever.  HENT: Positive for hearing loss and tinnitus.   Eyes: Negative for blurred vision and double vision.  Respiratory: Negative for cough and shortness of breath.   Cardiovascular: Negative for chest pain and palpitations.  Gastrointestinal: Negative for abdominal pain, heartburn and nausea.  Genitourinary: Negative for dysuria and urgency.  Musculoskeletal: Positive for joint pain (knee pain). Negative for myalgias.  Skin: Negative for rash.  Neurological: Positive for sensory change. Negative for dizziness, speech change and headaches.  Psychiatric/Behavioral:  Negative for hallucinations. The patient is not nervous/anxious.       Past Medical History:  Diagnosis Date   Arthritis    Cancer ([place])    Skin Cancer   Chronic kidney disease    Enlarged prostate    Hypertension    Occasional tremors    Peripheral edema    Sleep apnea    OSA---USE C-PAP   Subdural hematoma ([facility]) 02/21/2019   Tinnitus of both ears     Past Surgical History:  Procedure Laterality Date   BURR HOLE Left 02/21/2019   Procedure: BURR HOLES, Placement of Subdural drain;  Surgeon: Earnie Larsson, MD;  Location: [facility];  Service: Neurosurgery;  Laterality: Left;   COLONOSCOPY  3817/2006   COLONOSCOPY WITH PROPOFOL N/A 02/01/2016   Procedure: COLONOSCOPY WITH PROPOFOL;  Surgeon: Robert Bellow, MD;  Location: [facility] ENDOSCOPY;  Service: Endoscopy;  Laterality: N/A;   EYE SURGERY Right    Cataract Extraction with IOL   HERNIA REPAIR Left 1964   Inguinal Hernia Repair   KNEE ARTHROSCOPY Left    KNEE ARTHROSCOPY Right 06/26/2017   Procedure: ARTHROSCOPY KNEE WITH DEBRIDEMENT AND LOOSE BODY REMOVAL;  Surgeon: Corky Mull, MD;  Location: [place];  Service: Orthopedics;  Laterality: Right;   KNEE ARTHROSCOPY WITH MEDIAL MENISECTOMY Left 05/02/2017   Procedure: KNEE ARTHROSCOPY WITH MEDIAL MENISECTOMY;  Surgeon: Corky Mull, MD;  Location: [facility] ORS;  Service: Orthopedics;  Laterality: Left;   KNEE ARTHROSCOPY WITH MENISCAL REPAIR  05/02/2017   Procedure: KNEE ARTHROSCOPY WITH MENISCAL REPAIR;  Surgeon: Corky Mull, MD;  Location:  [facility] ORS;  Service: Orthopedics;;   SEPTOPLASTY  2003   uvul  2004   repair    Family History  Problem Relation Age of Onset   Hypertension Mother    Alzheimer's disease Mother     Social History: Married. Independent PTA.  He reports that he has never smoked. He has never used smokeless tobacco. He reports that he does not drink alcohol or use drugs.    Allergies  Allergen Reactions   Codeine Anxiety     Sick     Medications Prior to Admission  Medication Sig Dispense Refill   acetaminophen (TYLENOL) 500 MG tablet Take 1,000 mg by mouth every 6 (six) hours as needed (for pain.).     amLODipine (NORVASC) 5 MG tablet Take 10 mg by mouth daily.      aspirin EC 81 MG tablet Take 81 mg by mouth daily.     Glucosamine Sulfate 1000 MG CAPS Take 2,000 mg by mouth daily.      ibuprofen (ADVIL,MOTRIN) 200 MG tablet Take 400 mg by mouth every 8 (eight) hours as needed (for pain.).     lisinopril (PRINIVIL,ZESTRIL) 20 MG tablet Take 20 mg by mouth daily.     potassium chloride SA (K-DUR,KLOR-CON) 20 MEQ tablet Take 20 mEq by mouth 2 (two) times daily.     propranolol (INDERAL) 40 MG tablet Take 40 mg by mouth 2 (two) times daily.     tamsulosin (FLOMAX) 0.4 MG CAPS capsule Take 0.8 mg by mouth at bedtime.      vitamin E (VITAMIN E) 400 UNIT capsule Take 400 Units by mouth daily.      Home: Home Living Family/patient expects to be discharged to:: Inpatient rehab Living Arrangements: Spouse/significant other Available Help at Discharge: Family, Available 24 hours/day Type of Home: House Home Access: Stairs to enter [employer] of Steps: 4 Entrance Stairs-Rails: Right Home Layout: Two level Alternate Level Stairs-Number of Steps: 1 flight Alternate Level Stairs-Rails: Right Bathroom Toilet: Standard Home Equipment: None  Lives With: Spouse  Functional History: Prior Function Level of Independence: Independent Comments: Drives, independent PTA per chart. Difficult historian secondary to aphasia. Functional Status:  Mobility: Bed Mobility Overal bed mobility: Needs Assistance Bed Mobility: Supine to Sit Rolling: Min assist Sidelying to sit: Mod assist General bed mobility comments: ModA for trunk elevation to get EOB Transfers Overall transfer level: Needs assistance Equipment used: Rolling walker (2 wheeled) Transfers: Sit to/from Stand Sit to Stand: Min  assist General transfer comment: Min assist to power to stand Ambulation/Gait Ambulation/Gait assistance: Min assist Gait Distance (Feet): 60 Feet Assistive device: Rolling walker (2 wheeled) Gait Pattern/deviations: Step-through pattern, Shuffle, Decreased stride length General Gait Details: Pt with slow, overall fairly steady gait. However, does run into objects on right side, requiring assistance to correct walker onto straight path Gait velocity: decreased Gait velocity interpretation: <1.8 ft/sec, indicate of risk for recurrent falls    ADL: ADL Overall ADL's : Needs assistance/impaired Eating/Feeding: NPO Grooming: Moderate assistance Grooming Details (indicate cue type and reason): supported sitting Upper Body Bathing: Moderate assistance Upper Body Bathing Details (indicate cue type and reason): supported sitting Lower Body Bathing: Maximal assistance Lower Body Bathing Details (indicate cue type and reason): +2 min A sit<>stand Upper Body Dressing : Moderate assistance Upper Body Dressing Details (indicate cue type and reason): supported sitting Lower Body Dressing: Maximal assistance Lower Body Dressing Details (indicate cue type and reason): +2 min A sit<>stand Toilet Transfer: Minimal assistance, +2 for physical assistance,  Stand-pivot Armed forces technical officer Details (indicate cue type and reason): Bil HHA Toileting- Clothing Manipulation and Hygiene: Total assistance Toileting - Clothing Manipulation Details (indicate cue type and reason): +2 min A sit<>stand  Cognition: Cognition Overall Cognitive Status: Impaired/Different from baseline Arousal/Alertness: Awake/alert Orientation Level: Oriented to person, Oriented to place, Oriented to situation, Disoriented to time(when redirected pt was able to state month) Attention: Sustained Sustained Attention: Appears intact Memory: Impaired Memory Impairment: Storage deficit, Retrieval deficit Awareness: Impaired Awareness  Impairment: Intellectual impairment Problem Solving: Impaired Problem Solving Impairment: Functional basic, Verbal basic Executive Function: Self Monitoring, Self Correcting Safety/Judgment: Impaired Cognition Arousal/Alertness: Awake/alert Behavior During Therapy: Flat affect Overall Cognitive Status: Impaired/Different from baseline Area of Impairment: Attention, Memory Orientation Level: Disoriented to, Place, Time Current Attention Level: Sustained Memory: Decreased short-term memory Following Commands: Follows one step commands with increased time Problem Solving: Slow processing, Requires verbal cues, Difficulty sequencing General Comments: A&Ox4, although did say it was "Monday," instead of "Wednesday." Difficult to assess due to: Impaired communication   Blood pressure (!) 142/100, pulse 82, temperature 98.5 F (36.9 C), temperature source Oral, resp. rate 18, height 5\' 6"  (1.676 m), weight 80.6 kg, SpO2 95 %. Physical Exam  Nursing note and vitals reviewed. Constitutional: He is oriented to person, place, and time. He appears well-developed and well-nourished.  HENT:  Head: Normocephalic and atraumatic.  Left scalp incision C/D/I with staples in place.   Eyes: Pupils are equal, round, and reactive to light. Conjunctivae and EOM are normal.  Left lid edema  Neck: Normal range of motion.  Cardiovascular: Normal rate, regular rhythm and normal heart sounds.  No murmur heard. Respiratory: Effort normal and breath sounds normal. No respiratory distress. He has no wheezes.  GI: Soft. Bowel sounds are normal. He exhibits distension. There is no abdominal tenderness.  Musculoskeletal: Normal range of motion.        General: No edema.     Comments: No pain with upper limb or lower limb range of motion.  Neurological: He is alert and oriented to person, place, and time. He displays no tremor. No cranial nerve deficit or sensory deficit. He exhibits normal muscle tone. Gait  abnormal.  Motor strength is 4+ in the right deltoid bicep tricep grip 5/5 in left deltoid bicep tricep grip 5/5 bilateral hip flexor knee extensor ankle dorsiflexor Patient has decreased finger to thumb opposition on the right side compared to left side.  Psychiatric: He has a normal mood and affect.  Patient is unable to spell world backwards Unable to perform serial sevens Immediate recall of 2/3 objects, delayed recall of 0/3 objects  Results for orders placed or performed during the hospital encounter of 02/21/19 (from the past 24 hour(s))  Basic metabolic panel     Status: Abnormal   Collection Time: 02/25/19  4:27 AM  Result Value Ref Range   Sodium 141 135 - 145 mmol/L   Potassium 3.0 (L) 3.5 - 5.1 mmol/L   Chloride 103 98 - 111 mmol/L   CO2 26 22 - 32 mmol/L   Glucose, Bld 121 (H) 70 - 99 mg/dL   BUN 18 8 - 23 mg/dL   Creatinine, Ser 0.79 0.61 - 1.24 mg/dL   Calcium 8.7 (L) 8.9 - 10.3 mg/dL   GFR calc non Af Amer >60 >60 mL/min   GFR calc Af Amer >60 >60 mL/min   Anion gap 12 5 - 15  Magnesium     Status: None   Collection Time: 02/25/19  4:27 AM  Result Value Ref Range   Magnesium 2.0 1.7 - 2.4 mg/dL   Dg Swallowing Func-speech Pathology  Result Date: 02/24/2019 Objective Swallowing Evaluation: Type of Study: MBS-Modified Barium Swallow Study  Patient Details Name: GEOVANNIE VILAR MRN: [id number] Date of Birth: [birth year] Today's Date: 02/24/2019 Time: SLP Start Time (ACUTE ONLY): 1438 -SLP Stop Time (ACUTE ONLY): 1455 SLP Time Calculation (min) (ACUTE ONLY): 17 min Past Medical History: Past Medical History: Diagnosis Date  Arthritis   Cancer ([place])   Skin Cancer  Chronic kidney disease   Hypertension   Occasional tremors   Sleep apnea   OSA---USE C-PAP  Subdural hematoma ([place]) 02/21/2019  Tinnitus of both ears  Past Surgical History: Past Surgical History: Procedure Laterality Date  BURR HOLE Left 02/21/2019  Procedure: BURR HOLES, Placement of Subdural drain;  Surgeon:  Earnie Larsson, MD;  Location: [facility];  Service: Neurosurgery;  Laterality: Left;  COLONOSCOPY  3817/2006  COLONOSCOPY WITH PROPOFOL N/A 02/01/2016  Procedure: COLONOSCOPY WITH PROPOFOL;  Surgeon: Robert Bellow, MD;  Location: [facility] ENDOSCOPY;  Service: Endoscopy;  Laterality: N/A;  EYE SURGERY Right   Cataract Extraction with IOL  HERNIA REPAIR Left 1964  Inguinal Hernia Repair  KNEE ARTHROSCOPY Left   KNEE ARTHROSCOPY Right 06/26/2017  Procedure: ARTHROSCOPY KNEE WITH DEBRIDEMENT AND LOOSE BODY REMOVAL;  Surgeon: Corky Mull, MD;  Location: [place];  Service: Orthopedics;  Laterality: Right;  KNEE ARTHROSCOPY WITH MEDIAL MENISECTOMY Left 05/02/2017  Procedure: KNEE ARTHROSCOPY WITH MEDIAL MENISECTOMY;  Surgeon: Corky Mull, MD;  Location: [facility];  Service: Orthopedics;  Laterality: Left;  KNEE ARTHROSCOPY WITH MENISCAL REPAIR  05/02/2017  Procedure: KNEE ARTHROSCOPY WITH MENISCAL REPAIR;  Surgeon: Corky Mull, MD;  Location: [facility] OR[facility];  Service: Orthopedics;;  SEPTOPLASTY  2003  uvul  2004  repair HPI: 85 y/o who presented with increasing R-sided weakness from large L chronic subdural hematoma s/p evacuation 5/23. He developed seizure activity post-op and also on 5/24. PMH: Arthritis, Cancer, CKD, HTN, Occasional tremors, OSA, and Tinnitus of both ears.  Subjective: pt drowsy, needs cues Assessment / Plan / Recommendation CHL IP CLINICAL IMPRESSIONS 02/24/2019 Clinical Impression Pt exhibited functional oral and pharyngeal swallowing ability with suspected primary esophageal dysphagia. He did exhibit mild vallecular residue with solid texture with minimal impact to function. Timing and protective features of pharyngeal/laryngeal swallow were intact without penetration or aspiration although he continued to throat clear. Esophagus scanned revealing slower transit through lower esophageal sphincter which may be the cause or contribute to frequent throat clearing. Recommend regular texture, thin  liquids, pills with water and esophageal precautions. ST will continue to follow short term for education re: esophageal strategies . SLP Visit Diagnosis Dysphagia, unspecified (R13.10) Attention and concentration deficit following -- Frontal lobe and executive function deficit following -- Impact on safety and function Mild aspiration risk   CHL IP TREATMENT RECOMMENDATION 02/24/2019 Treatment Recommendations Therapy as outlined in treatment plan below   Prognosis 02/24/2019 Prognosis for Safe Diet Advancement Good Barriers to Reach Goals -- Barriers/Prognosis Comment -- CHL IP DIET RECOMMENDATION 02/24/2019 SLP Diet Recommendations Regular solids;Thin liquid Liquid Administration via Cup;Straw Medication Administration Whole meds with liquid Compensations Small sips/bites;Slow rate Postural Changes Seated upright at 90 degrees;Remain semi-upright after after feeds/meals (Comment)   CHL IP OTHER RECOMMENDATIONS 02/24/2019 Recommended Consults -- Oral Care Recommendations Oral care BID Other Recommendations --   CHL IP FOLLOW UP RECOMMENDATIONS 02/24/2019 Follow up Recommendations Inpatient Rehab   CHL IP FREQUENCY AND DURATION 02/24/2019 Speech  Therapy Frequency (ACUTE ONLY) min 1 x/week Treatment Duration 1 week      CHL IP ORAL PHASE 02/24/2019 Oral Phase WFL Oral - Pudding Teaspoon -- Oral - Pudding Cup -- Oral - Honey Teaspoon -- Oral - Honey Cup -- Oral - Nectar Teaspoon -- Oral - Nectar Cup -- Oral - Nectar Straw -- Oral - Thin Teaspoon -- Oral - Thin Cup -- Oral - Thin Straw -- Oral - Puree -- Oral - Mech Soft -- Oral - Regular -- Oral - Multi-Consistency -- Oral - Pill -- Oral Phase - Comment --  CHL IP PHARYNGEAL PHASE 02/24/2019 Pharyngeal Phase WFL Pharyngeal- Pudding Teaspoon -- Pharyngeal -- Pharyngeal- Pudding Cup -- Pharyngeal -- Pharyngeal- Honey Teaspoon -- Pharyngeal -- Pharyngeal- Honey Cup -- Pharyngeal -- Pharyngeal- Nectar Teaspoon -- Pharyngeal -- Pharyngeal- Nectar Cup -- Pharyngeal -- Pharyngeal-  Nectar Straw -- Pharyngeal -- Pharyngeal- Thin Teaspoon -- Pharyngeal -- Pharyngeal- Thin Cup -- Pharyngeal -- Pharyngeal- Thin Straw -- Pharyngeal -- Pharyngeal- Puree -- Pharyngeal -- Pharyngeal- Mechanical Soft -- Pharyngeal -- Pharyngeal- Regular -- Pharyngeal -- Pharyngeal- Multi-consistency -- Pharyngeal -- Pharyngeal- Pill -- Pharyngeal -- Pharyngeal Comment --  CHL IP CERVICAL ESOPHAGEAL PHASE 02/24/2019 Cervical Esophageal Phase WFL Pudding Teaspoon -- Pudding Cup -- Honey Teaspoon -- Honey Cup -- Nectar Teaspoon -- Nectar Cup -- Nectar Straw -- Thin Teaspoon -- Thin Cup -- Thin Straw -- Puree -- Mechanical Soft -- Regular -- Multi-consistency -- Pill -- Cervical Esophageal Comment -- [place] Siren 02/24/2019, 5:05 PM  Orbie Pyo Colvin Caroli.Ed Actor Pager [phone] Office [phone]               Assessment/Plan: Diagnosis: Acute on chronic left subdural hematoma, with cognitive dysfunction and poor balance as well as right upper extremity weakness 1. Does the need for close, 24 hr/day medical supervision in concert with the patient's rehab needs make it unreasonable for this patient to be served in a less intensive setting? Yes 2. Co-Morbidities requiring supervision/potential complications: Hypertension, chronic kidney disease, obstructive sleep apnea, 3. Due to bladder management, bowel management, safety, skin/wound care, disease management, medication administration, pain management and patient education, does the patient require 24 hr/day rehab nursing? Yes 4. Does the patient require coordinated care of a physician, rehab nurse, PT (1-2 hrs/day, 5 days/week), OT (1-2 hrs/day, 5 days/week) and SLP (.5-1 hrs/day, 5 days/week) to address physical and functional deficits in the context of the above medical diagnosis(es)? Yes Addressing deficits in the following areas: balance, endurance, locomotion, strength, transferring, bowel/bladder control, bathing,  dressing, feeding, grooming, toileting, cognition, speech, language, swallowing and psychosocial support 5. Can the patient actively participate in an intensive therapy program of at least 3 hrs of therapy per day at least 5 days per week? Yes 6. The potential for patient to make measurable gains while on inpatient rehab is good 7. Anticipated functional outcomes upon discharge from inpatient rehab are supervision  with PT, supervision with OT, supervision with SLP. 8. Estimated rehab length of stay to reach the above functional goals is: 7-10d 9. Anticipated D/C setting: Home 10. Anticipated post D/C treatments: [place] therapy 11. Overall Rehab/Functional Prognosis: good  RECOMMENDATIONS: This patient's condition is appropriate for continued rehabilitative care in the following setting: CIR Patient has agreed to participate in recommended program. Yes Note that insurance prior authorization may be required for reimbursement for recommended care.  Comment: Patient indicates that his wife can assist at home however this needs to be verified due to his  cognitive dysfunction  "I have personally performed a face to face diagnostic evaluation of this patient.  Additionally, I have reviewed and concur with the physician assistant's documentation above."  Charlett Blake M.D. [facility] FAAPM&R (Sports Med, Neuromuscular Med) Diplomate [place] Board of [place], PA-C 02/25/2019

## 2019-02-25 NOTE — Progress Notes (Signed)
Assisted pt. With setting up his home cpap. Pt. Places himself on. 2L of oxygen titrated into the cpap.

## 2019-02-25 NOTE — Progress Notes (Signed)
Patient doing well.  No headache.  No further seizure activity.  Up in a chair.  Awake and alert.  Oriented and appropriate.  Motor and sensory function intact.  Wound clean and dry.  Doing well following bur hole evacuation of subdural hematoma.  Continue efforts at mobilization.  Inpatient rehabilitation consulted.  Switch antiepileptic medicines to oral form.

## 2019-02-25 NOTE — Progress Notes (Signed)
Inpatient Rehabilitation-Admissions Coordinator    Met with patient at the bedside to discuss team's recommendation for inpatient rehabilitation. Shared booklets, expectations while in CIR, expected length of stay, and anticipated functional level at DC.   Pt is interested in the program and willing to proceed with CIR once medically ready and if insurance approves. With permission, AC placed call to pt's wife to discuss program details and confirm caregiver support at home; Awaiting call back.   Please call if questions.   Jhonnie Garner, OTR/L  Rehab Admissions Coordinator  [phone] 02/25/2019 3:27 PM

## 2019-02-26 LAB — BASIC METABOLIC PANEL
Anion gap: 11 (ref 5–15)
BUN: 11 mg/dL (ref 8–23)
CO2: 25 mmol/L (ref 22–32)
Calcium: 8.8 mg/dL — ABNORMAL LOW (ref 8.9–10.3)
Chloride: 104 mmol/L (ref 98–111)
Creatinine, Ser: 0.77 mg/dL (ref 0.61–1.24)
GFR calc Af Amer: >60 mL/min (ref >60)
GFR calc non Af Amer: >60 mL/min (ref >60)
Glucose, Bld: 161 mg/dL — ABNORMAL HIGH (ref 70–99)
Potassium: 3.4 mmol/L — ABNORMAL LOW (ref 3.5–5.1)
Sodium: 140 mmol/L (ref 135–145)

## 2019-02-26 LAB — PHENYTOIN LEVEL, FREE AND TOTAL
Phenytoin, Free: 1.1 ug/mL (ref 1.0–2.0)
Phenytoin, Total: 12.5 ug/mL (ref 10.0–20.0)

## 2019-02-26 MED ORDER — CHLORHEXIDINE GLUCONATE CLOTH 2 % EX PADS
6.0000 | MEDICATED_PAD | Freq: Every day | CUTANEOUS | Status: DC
  Administered 2019-02-26 – 2019-03-02 (×5): 6 via TOPICAL

## 2019-02-26 NOTE — H&P (Signed)
Physical Medicine and Rehabilitation Admission H&P    CC: SDH   HPI: Preston Hill is an 85 year old male with history of OSA, HTN, BPH, chronic tinnitus who was admitted on 02/21/19 with 2-3 day history of HA progressing to right sided weakness and difficulty walking.  He was found to have acute on chronic large left SDH with left to right shift and effacement of frontal and parietal lobes. He was on low dose ASA and denies any falls or recent trauma. He was taken to OR the same day for burr hole evacuation with placement of subdural drain by Dr. Annette Stable. Post noted started on dilantin and Keppra due to abnormal lip smacking movement. He had recurrent seizures--generalized motor activity with foaming and decline in MS and ongoing LUE motor activity. Keppra increased to 1000 mg bid.  Follow up CT head showed interval evacuation of L-SDH with decrease in mass effect.   Lethargy has resolved and patient noted to have residual right visual field deficits with delayed processing and balance deficits. Follow up labs reveal persistent hypokalemia as well as ABLA. He continues to have tachycardia with HR up to 120's and new onset A fib.  Cardiology consulted for input and he was started on IV cardizem--transitioned to low dose metoprolol today with recommendations for close monitoring  due to hx of 1 degree AVB.  Eliquis recommended when cleared by NS and to follow up with Dr. Lovena Le past discharge.  Therapy ongoing and patient continues to have balance deficits with shuffling gait and hypoxia with activity--requires 4 L per [place]. CIR recommended due to functional deficits.    Review of Systems  Constitutional: Negative for chills and fever.  HENT: Positive for hearing loss and tinnitus (chronic).   Eyes: Negative for blurred vision.  Respiratory: Negative for cough and shortness of breath.   Cardiovascular: Negative for chest pain and palpitations.  Gastrointestinal: Negative for heartburn and nausea.   Genitourinary: Negative for dysuria.  Skin: Negative for rash.  Neurological: Negative for dizziness and headaches.  Psychiatric/Behavioral: Negative for memory loss.      Past Medical History:  Diagnosis Date  . Arthritis   . Cancer ([place])    Skin Cancer  . Chronic kidney disease   . Enlarged prostate   . Hypertension   . Occasional tremors   . Peripheral edema   . Sleep apnea    OSA---USE C-PAP  . Subdural hematoma ([place]) 02/21/2019  . Tinnitus of both ears     Past Surgical History:  Procedure Laterality Date  . BURR HOLE Left 02/21/2019   Procedure: BURR HOLES, Placement of Subdural drain;  Surgeon: Earnie Larsson, MD;  Location: [facility];  Service: Neurosurgery;  Laterality: Left;  . COLONOSCOPY  3817/2006  . COLONOSCOPY WITH PROPOFOL N/A 02/01/2016   Procedure: COLONOSCOPY WITH PROPOFOL;  Surgeon: Robert Bellow, MD;  Location: [facility] ENDOSCOPY;  Service: Endoscopy;  Laterality: N/A;  . EYE SURGERY Right    Cataract Extraction with IOL  . HERNIA REPAIR Left 1964   Inguinal Hernia Repair  . KNEE ARTHROSCOPY Left   . KNEE ARTHROSCOPY Right 06/26/2017   Procedure: ARTHROSCOPY KNEE WITH DEBRIDEMENT AND LOOSE BODY REMOVAL;  Surgeon: Corky Mull, MD;  Location: [place];  Service: Orthopedics;  Laterality: Right;  . KNEE ARTHROSCOPY WITH MEDIAL MENISECTOMY Left 05/02/2017   Procedure: KNEE ARTHROSCOPY WITH MEDIAL MENISECTOMY;  Surgeon: Corky Mull, MD;  Location: [facility] ORS;  Service: Orthopedics;  Laterality: Left;  .  KNEE ARTHROSCOPY WITH MENISCAL REPAIR  05/02/2017   Procedure: KNEE ARTHROSCOPY WITH MENISCAL REPAIR;  Surgeon: Corky Mull, MD;  Location: [facility] ORS;  Service: Orthopedics;;  . SEPTOPLASTY  2003  . uvul  2004   repair    Family History  Problem Relation Age of Onset  . Hypertension Mother   . Alzheimer's disease Mother     Social History:  Married. Independent without AD PTA. He reports that he has never smoked. He has never used smokeless tobacco.  He reports that he does not drink alcohol or use drugs.    Allergies  Allergen Reactions  . Codeine Anxiety    Sick     Medications Prior to Admission  Medication Sig Dispense Refill  . acetaminophen (TYLENOL) 500 MG tablet Take 1,000 mg by mouth every 6 (six) hours as needed (for pain.).    Marland Kitchen amLODipine (NORVASC) 5 MG tablet Take 10 mg by mouth daily.     Marland Kitchen aspirin EC 81 MG tablet Take 81 mg by mouth daily.    . Glucosamine Sulfate 1000 MG CAPS Take 2,000 mg by mouth daily.     Marland Kitchen ibuprofen (ADVIL,MOTRIN) 200 MG tablet Take 400 mg by mouth every 8 (eight) hours as needed (for pain.).    Marland Kitchen lisinopril (PRINIVIL,ZESTRIL) 20 MG tablet Take 20 mg by mouth daily.    . potassium chloride SA (K-DUR,KLOR-CON) 20 MEQ tablet Take 20 mEq by mouth 2 (two) times daily.    . propranolol (INDERAL) 40 MG tablet Take 40 mg by mouth 2 (two) times daily.    . tamsulosin (FLOMAX) 0.4 MG CAPS capsule Take 0.8 mg by mouth at bedtime.     . vitamin E (VITAMIN E) 400 UNIT capsule Take 400 Units by mouth daily.      Drug Regimen Review  Drug regimen was reviewed and remains appropriate with no significant issues identified  Home: Home Living Family/patient expects to be discharged to:: Inpatient rehab Living Arrangements: Spouse/significant other Available Help at Discharge: Family, Available 24 hours/day Type of Home: House Home Access: Stairs to enter [employer] of Steps: 4 Entrance Stairs-Rails: Right Home Layout: Two level Alternate Level Stairs-Number of Steps: 1 flight Alternate Level Stairs-Rails: Right Bathroom Toilet: Standard Home Equipment: None  Lives With: Spouse   Functional History: Prior Function Level of Independence: Independent Comments: Drives, independent PTA per chart. Difficult historian secondary to aphasia.  Functional Status:  Mobility: Bed Mobility Overal bed mobility: Needs Assistance Bed Mobility: Rolling, Sidelying to Sit Rolling: Min guard  Sidelying to sit: Min guard, HOB elevated General bed mobility comments: Increased effort and time to elevate trunk to get to EOB. No dizziness.  Transfers Overall transfer level: Needs assistance Equipment used: Rolling walker (2 wheeled) Transfers: Sit to/from Stand Sit to Stand: Min assist General transfer comment: Min assist to power to stand, and for balance, and cues for hand placement; transferred to chair post ambulation. Ambulation/Gait Ambulation/Gait assistance: Min guard Gait Distance (Feet): 200 Feet Assistive device: Rolling walker (2 wheeled) Gait Pattern/deviations: Step-through pattern, Shuffle, Decreased stride length, Decreased dorsiflexion - right, Decreased dorsiflexion - left General Gait Details: Pt with slow, shuffling gait pattern. Cues for upright posture and walker proximity. 1 standing rest breaks. 2-3/4 DOE. Sp02 low 80s on RA, donned 3L/min 02 and SP02 remained >89%. HR 90-129 bpm A-fib. Gait velocity: .57 ft/sec Gait velocity interpretation: <1.31 ft/sec, indicative of household ambulator    ADL: ADL Overall ADL's : Needs assistance/impaired Eating/Feeding: Set up, Sitting Grooming:  Brushing hair, Minimal assistance, Standing Grooming Details (indicate cue type and reason): supported sitting Upper Body Bathing: Supervision/ safety, Sitting Upper Body Bathing Details (indicate cue type and reason): supported sitting Lower Body Bathing: Moderate assistance, Sit to/from stand Lower Body Bathing Details (indicate cue type and reason): +2 min A sit<>stand Upper Body Dressing : Minimal assistance, Sitting Upper Body Dressing Details (indicate cue type and reason): supported sitting Lower Body Dressing: Maximal assistance, Sit to/from stand Lower Body Dressing Details (indicate cue type and reason): unable to access feet  Toilet Transfer: Minimal assistance, Ambulation, Comfort height toilet, BSC, Grab bars, RW Toilet Transfer Details (indicate cue type and  reason): Bil HHA Toileting- Clothing Manipulation and Hygiene: Moderate assistance, Sit to/from stand Toileting - Clothing Manipulation Details (indicate cue type and reason): +2 min A sit<>stand Functional mobility during ADLs: Minimal assistance, Rolling walker General ADL Comments: requires assist for balance and cognition   Cognition: Cognition Overall Cognitive Status: Impaired/Different from baseline Arousal/Alertness: Awake/alert Orientation Level: Oriented X4 Attention: Sustained Sustained Attention: Appears intact Memory: Impaired Memory Impairment: Storage deficit, Retrieval deficit Awareness: Impaired Awareness Impairment: Intellectual impairment Problem Solving: Impaired Problem Solving Impairment: Functional basic, Verbal basic Executive Function: Self Monitoring, Self Correcting Safety/Judgment: Impaired Cognition Arousal/Alertness: Awake/alert Behavior During Therapy: WFL for tasks assessed/performed Overall Cognitive Status: Impaired/Different from baseline Area of Impairment: Attention Orientation Level: Disoriented to, Place, Time Current Attention Level: Selective Memory: Decreased short-term memory Following Commands: Follows one step commands consistently Safety/Judgement: Decreased awareness of safety Problem Solving: Requires verbal cues, Slow processing General Comments: A&Ox4 today. In good spirits. Joking. Impaired attention. Difficult to assess due to: Impaired communication   Blood pressure 125/73, pulse 85, temperature 97.9 F (36.6 C), temperature source Oral, resp. rate (!) 21, height 5\' 6"  (1.676 m), weight 80.6 kg, SpO2 98 %. Physical Exam  Constitutional: He is oriented to person, place, and time. He appears well-developed. No distress.  HENT:  Healing incision, left fronto-parietal area.   Eyes: Pupils are equal, round, and reactive to light. EOM are normal.  Neck: Normal range of motion. No tracheal deviation present. No thyromegaly  present.  Cardiovascular: Normal rate and regular rhythm.  Respiratory: Effort normal. No respiratory distress. He has no wheezes.  GI: Soft. He exhibits no distension. There is no abdominal tenderness.  Musculoskeletal:        General: No edema.  Neurological: He is alert and oriented to person, place, and time.  CN exam non-focal. Reasonable insight and awareness. Language and intact. Speech clear. RUE: 4/5 prox to distal. LUE 4+/5. RLE 4- to 4/5. LLE 4 to 4+/5. Senses pain in al 4's. No focal CN deficits.   Skin: Skin is warm. No erythema.  Psychiatric: He has a normal mood and affect. His behavior is normal.    Results for orders placed or performed during the hospital encounter of 02/21/19 (from the past 48 hour(s))  Comprehensive metabolic panel     Status: Abnormal   Collection Time: 03/02/19  5:40 AM  Result Value Ref Range   Sodium 135 135 - 145 mmol/L   Potassium 3.2 (L) 3.5 - 5.1 mmol/L   Chloride 95 (L) 98 - 111 mmol/L   CO2 27 22 - 32 mmol/L   Glucose, Bld 27 (LL) 70 - 99 mg/dL    Comment: CRITICAL RESULT CALLED TO, READ BACK BY AND VERIFIED WITH: JONES,K RN 03/02/2019 0653 JORDANS    BUN 20 8 - 23 mg/dL   Creatinine, Ser 0.55 (L) 0.61 - 1.24 mg/dL  Calcium 8.8 (L) 8.9 - 10.3 mg/dL   Total Protein 5.9 (L) 6.5 - 8.1 g/dL   Albumin 2.9 (L) 3.5 - 5.0 g/dL   AST 43 (H) 15 - 41 U/L   ALT 48 (H) 0 - 44 U/L   Alkaline Phosphatase 52 38 - 126 U/L   Total Bilirubin 0.6 0.3 - 1.2 mg/dL   GFR calc non Af Amer >60 >60 mL/min   GFR calc Af Amer >60 >60 mL/min   Anion gap 13 5 - 15    Comment: Performed at [facility] [address] [place], [place] [address]  Phenytoin level, total     Status: Abnormal   Collection Time: 03/02/19  5:40 AM  Result Value Ref Range   Phenytoin Lvl 6.1 (L) 10.0 - 20.0 ug/mL    Comment: Performed at [facility] [address] [place], [place] [address]  Glucose, capillary     Status: None   Collection Time: 03/02/19  6:55 AM   Result Value Ref Range   Glucose-Capillary 90 70 - 99 mg/dL   No results found.     Medical Problem List and Plan: 1.  Functional and mobility deficits secondary to left fronto parietal SDH  -admit to inpatient rehab 2.  Antithrombotics: -DVT/anticoagulation:  Mechanical: Sequential compression devices, below knee Bilateral lower extremities  -antiplatelet therapy: Off ASA 3. Pain Management: Hydrocodone prn.   4. Mood: LCSW to follow for evaluation and support.  -antipsychotic agents: N/A 5. Neuropsych: This patient is capable of making decisions on his own behalf. 6. Skin/Wound Care: Monitor incision for healing.  7. Fluids/Electrolytes/Nutrition: Monitor I/O. Check lytes in am.  8. New onset seizures/Status epilepticus?: On  Keppra 1000 mg bid and Dilantin 100 mg tid.  9. OSA: Encourage compliance with CPAP 10. BPH: Resume Flomax --was on 0.8 mg/HS.   -monitor urine output and residucals. 11. CKD: Improved with IVF for hydration. Has history of peripheral edema--d/c IVF to avoid overload.   -daily weights 13. HTN: Monitor BP tid--continue to hold Norvasc, Lisinopril and propranolol (question for tremors). Inconsistent control in last 24 hours.  14. Hypokalemia: Needs to be supplemented. Will add K dur.  15. New onset A fib: Monitor HR bid. Continue metoprolol 12.5 mg bid which was initiated today  16. Hypoxia: Encourage IS. Continue to wean oxygen as tolerated.  Will order CXR to rule out fluid overload or infectious etiology.       Bary Leriche, PA-C 03/02/2019

## 2019-02-26 NOTE — Progress Notes (Signed)
Placed patient on home CPAP for the night set at 9cm H20 with oxygen set at 3lpm . Sp02=98% at this time.

## 2019-02-26 NOTE — Progress Notes (Signed)
Physical Therapy Treatment Patient Details Name: Preston Hill MRN: [id number] DOB: 07/21/1934 Today's Date: 02/26/2019    History of Present Illness Patient is a 85 y/o male who presents with progressive RUE/LE weakness. Found to left sided SDH with mass effect s/p burr hole evacuation and placement of subdural drain 5/23. Postoperatively had seizures x3. PMH includes HTN, CKD, skin ca.     PT Comments    Pt making steady progress towards his physical therapy goals. Increased ambulation distance to 100 feet with walker and min guard assist. Still requiring min assist for transfers. Gait speed of 0.65 ft/s indicative that pt remains at high risk for falls. HR variable 115-160 bpm.  Focused on right head turns during ambulation to increase attention to right side (pt with right visual field deficit). Pt with noted decreased recall and ability to carry over new information. Continue to recommend comprehensive inpatient rehab (CIR) for post-acute therapy needs.     Follow Up Recommendations  CIR;Supervision for mobility/OOB;Supervision/Assistance - 24 hour     Equipment Recommendations  Rolling walker with 5" wheels    Recommendations for Other Services       Precautions / Restrictions Precautions Precautions: Fall Restrictions Weight Bearing Restrictions: No    Mobility  Bed Mobility               General bed mobility comments: OOB in chair  Transfers Overall transfer level: Needs assistance Equipment used: Rolling walker (2 wheeled) Transfers: Sit to/from Stand Sit to Stand: Min assist         General transfer comment: Min assist to power to stand  Ambulation/Gait Ambulation/Gait assistance: Min guard Gait Distance (Feet): 100 Feet Assistive device: Rolling walker (2 wheeled) Gait Pattern/deviations: Step-through pattern;Shuffle;Decreased stride length Gait velocity: 0.65 ft/s Gait velocity interpretation: <1.31 ft/sec, indicative of household  ambulator General Gait Details: Decreased instances today of running into objects on right side, cues for right head turns and attending to right side.    Stairs             Wheelchair Mobility    Modified Rankin (Stroke Patients Only) Modified Rankin (Stroke Patients Only) Pre-Morbid Rankin Score: No significant disability Modified Rankin: Moderately severe disability     Balance Overall balance assessment: Needs assistance Sitting-balance support: Feet supported;No upper extremity supported Sitting balance-Leahy Scale: Good     Standing balance support: During functional activity;Bilateral upper extremity supported Standing balance-Leahy Scale: Poor Standing balance comment: Requires external support for standing balance.                            Cognition Arousal/Alertness: Awake/alert Behavior During Therapy: Flat affect Overall Cognitive Status: Impaired/Different from baseline Area of Impairment: Attention;Memory                   Current Attention Level: Sustained Memory: Decreased short-term memory       Problem Solving: Slow processing;Requires verbal cues;Difficulty sequencing General Comments: Decreased recall noted      Exercises General Exercises - Lower Extremity Long Arc Quad: 10 reps;Both;Seated Hip Flexion/Marching: 10 reps;Both;Seated Other Exercises Other Exercises: Serial sit to stands x 5    General Comments        Pertinent Vitals/Pain Pain Assessment: Faces Faces Pain Scale: No hurt Pain Intervention(s): Monitored during session    Home Living                      Prior Function  PT Goals (current goals can now be found in the care plan section) Acute Rehab PT Goals Patient Stated Goal: "garden my tomatoes again." Potential to Achieve Goals: Good Progress towards PT goals: Progressing toward goals    Frequency    Min 4X/week      PT Plan Current plan remains appropriate     Co-evaluation              AM-PAC PT "6 Clicks" Mobility   Outcome Measure  Help needed turning from your back to your side while in a flat bed without using bedrails?: A Little Help needed moving from lying on your back to sitting on the side of a flat bed without using bedrails?: A Lot Help needed moving to and from a bed to a chair (including a wheelchair)?: A Little Help needed standing up from a chair using your arms (e.g., wheelchair or bedside chair)?: A Little Help needed to walk in hospital room?: A Little Help needed climbing 3-5 steps with a railing? : A Lot 6 Click Score: 16    End of Session Equipment Utilized During Treatment: Gait belt Activity Tolerance: Patient tolerated treatment well Patient left: in chair;with call bell/phone within reach;with chair alarm set Nurse Communication: Mobility status PT Visit Diagnosis: Unsteadiness on feet (R26.81);Muscle weakness (generalized) (M62.81);Difficulty in walking, not elsewhere classified (R26.2)     Time: 6468-0321 PT Time Calculation (min) (ACUTE ONLY): 18 min  Charges:  $Therapeutic Exercise: 8-22 mins                     Ellamae Sia, PT, DPT Acute Rehabilitation Services Pager [phone] Office [phone]    Willy Eddy 02/26/2019, 1:43 PM

## 2019-02-26 NOTE — Progress Notes (Signed)
Assisted tele visit to patient with wife and son.Cleotis Nipper, RN

## 2019-02-26 NOTE — PMR Pre-admission (Signed)
PMR Admission Coordinator Pre-Admission Assessment  Patient: Preston Hill is an 85 y.o., male MRN: [id number] DOB: 11-Feb-1934 Height: 5' 6"  (167.6 cm) Weight: 80.6 kg              Insurance Information HMO: yes    PPO:      PCP:      IPA:      80/20:      OTHER:  PRIMARY: [employer]      Policy#: [id number]      Subscriber: Patient CM Name: Preston Hill      Phone#: [phone] [id number]     Fax#: [phone] Pre-Cert#: [id number]      Employer:  Josem Kaufmann provided by [employer] on 5/29 for admit to CIR. Pt is able to admit til 6/1 under this auth. Weekly updates regarding DC are due to [facility] (p): [phone] ext [phone] (f): [phone].  Benefits:  Phone #: NA, online at [email or web address]     Name: [email or web address] Eff. Date: 10/01/18 still active     Deduct: $0      Out of Pocket Max: $3,000 (met $488.59)      Life Max: NA CIR: $295/day for days 1-6, $0/day for days 7+      SNF: $0/day for days 1-20, $178/day for days 21-100, limited to 100 days/cal yr Outpatient: limited by medical necessity     Co-Pay: $10-40/visit pending service Home Health: 100%, limited to medical necessity      Co-Pay: 0% DME: 80%     Co-Pay: 20% Providers:  SECONDARY: None       Policy#:       Subscriber:  CM Name:       Phone#:      Fax#:  Pre-Cert#      Employer:  Benefits:  Phone #:      Name:  Eff. Date:      Deduct:       Out of Pocket Max:       Life Max:  CIR:       SNF:  Outpatient:      Co-Pay:  Home Health:       Co-Pay:  DME:      Co-Pay:  Medicaid Application Date:       Case Manager:  Disability Application Date:       Case Worker:   The "Data Collection Information Summary" for patients in Inpatient Rehabilitation Facilities with attached "Privacy Act Preston Hill" was provided and verbally reviewed with: Family and pt  Emergency Contact Information Contact Information    Name Relation Home Work Mobile   Preston Hill [phone]  [phone]   Preston Hill, Preston Hill    [phone]     Current Medical History  Patient Admitting Diagnosis:  Acute on chronic left subdural hematoma, with cognitive dysfunction and poor balance as well as right upper extremity weakness  History of Present Illness: Preston Hill is an 85 year old male with history of OSA, HTN, BPH, chronic tinnitus who was admitted on 02/21/19 with 2-3 day history of HA progressing to right sided weakness and difficulty walking.  He was found to have acute on chronic large left SDH with left to right shift and effacement of frontal and parietal lobes. He was on low dose ASA and denies any falls or recent trauma. He was taken to OR the same day for burr hole evacuation with placement of subdural drain by Dr. Annette Stable. Post noted started on dilantin and Keppra due to abnormal lip  smacking movement. He had recurrent seizures--generalized motor activity with foaming and decline in MS and ongoing LUE motor activity. Keppra increased to 1000 mg bid.  Follow up CT head showed interval evacuation of L-SDH with decrease in mass effect.   Lethargy has resolved and patient noted to have residual right visual field deficits with delayed processing and balance deficits. Follow up labs reveal persistent hypokalemia as well as ABLA. He continues to have tachycardia with HR up to 120's and new onset A fib.  Cardiology consulted for input and he was started on IV cardizem--transitioned to low dose metoprolol today with recommendations for close monitoring  due to hx of 1 degree AVB.  Eliquis recommended when cleared by NS and to follow up with Dr. Lovena Hill past discharge.  Therapy ongoing and patient continues to have balance deficits with shuffling gait and hypoxia with activity--requires 3 L per [place]. CIR recommended due to functional deficits. Pt is to be admitted to CIR on 03/02/19.  Complete NIHSS TOTAL: 0 Glasgow Coma Scale Score: 15  Past Medical History  Past Medical History:  Diagnosis Date  . Arthritis   . Cancer ([place])     Skin Cancer  . Chronic kidney disease   . Enlarged prostate   . Hypertension   . Occasional tremors   . Peripheral edema   . Sleep apnea    OSA---USE C-PAP  . Subdural hematoma ([place]) 02/21/2019  . Tinnitus of both ears     Family History  family history includes Alzheimer's disease in his mother; Hypertension in his mother.  Prior Rehab/Hospitalizations:  Has the patient had prior rehab or hospitalizations prior to admission? No  Has the patient had major surgery during 100 days prior to admission? Yes  Current Medications   Current Facility-Administered Medications:  .  0.9 %  sodium chloride infusion, , Intravenous, Continuous, Pool, Mallie Mussel, MD, Last Rate: 75 mL/hr at 02/25/19 1235 .  0.9 %  sodium chloride infusion, , Intravenous, PRN, Earnie Larsson, MD .  acetaminophen (TYLENOL) tablet 650 mg, 650 mg, Oral, Q4H PRN, 650 mg at 02/26/19 1608 **OR** acetaminophen (TYLENOL) suppository 650 mg, 650 mg, Rectal, Q4H PRN, Earnie Larsson, MD .  Chlorhexidine Gluconate Cloth 2 % PADS 6 each, 6 each, Topical, Daily, Earnie Larsson, MD, 6 each at 03/02/19 1039 .  HYDROcodone-acetaminophen (NORCO/VICODIN) 5-325 MG per tablet 1 tablet, 1 tablet, Oral, Q4H PRN, Earnie Larsson, MD, 1 tablet at 03/01/19 2020 .  HYDROmorphone (DILAUDID) injection 0.5-1 mg, 0.5-1 mg, Intravenous, Q2H PRN, Earnie Larsson, MD, 0.5 mg at 02/23/19 0445 .  labetalol (NORMODYNE) injection 10-40 mg, 10-40 mg, Intravenous, Q10 min PRN, Earnie Larsson, MD, 10 mg at 02/28/19 0105 .  levETIRAcetam (KEPPRA) tablet 1,000 mg, 1,000 mg, Oral, BID, Earnie Larsson, MD, 1,000 mg at 03/02/19 0923 .  LORazepam (ATIVAN) injection 1 mg, 1 mg, Intravenous, Q6H PRN, Clydell Hakim, MD .  MEDLINE mouth rinse, 15 mL, Mouth Rinse, BID, Earnie Larsson, MD, 15 mL at 03/01/19 2021 .  metoprolol tartrate (LOPRESSOR) tablet 12.5 mg, 12.5 mg, Oral, BID, Hilty, Nadean Corwin, MD, 12.5 mg at 03/02/19 1037 .  ondansetron (ZOFRAN) tablet 4 mg, 4 mg, Oral, Q4H PRN **OR**  ondansetron (ZOFRAN) injection 4 mg, 4 mg, Intravenous, Q4H PRN, Pool, Henry, MD .  pantoprazole (PROTONIX) EC tablet 40 mg, 40 mg, Oral, Daily, Norva Riffle, RPH, 40 mg at 03/02/19 6256 .  phenytoin (DILANTIN) ER capsule 100 mg, 100 mg, Oral, TID, Earnie Larsson, MD, 100 mg at 03/02/19 0923 .  promethazine (PHENERGAN) tablet 12.5-25 mg, 12.5-25 mg, Oral, Q4H PRN, Earnie Larsson, MD  Patients Current Diet:  Diet Order            Diet regular Room service appropriate? Yes with Assist; Fluid consistency: Thin  Diet effective now              Precautions / Restrictions Precautions Precautions: Fall Precaution Comments: watch HR, 02 Restrictions Weight Bearing Restrictions: No   Has the patient had 2 or more falls or a fall with injury in the past year?No  Prior Activity Level Community (5-7x/wk): retired Clinical biochemist; drove PTA. active  Prior Functional Level Prior Function Level of Independence: Independent Comments: Drives, independent PTA per chart. Difficult historian secondary to aphasia.  Self Care: Did the patient need help bathing, dressing, using the toilet or eating?  Independent  Indoor Mobility: Did the patient need assistance with walking from room to room (with or without device)? Independent  Stairs: Did the patient need assistance with internal or external stairs (with or without device)? Independent  Functional Cognition: Did the patient need help planning regular tasks such as shopping or remembering to take medications? Independent  Home Assistive Devices / Equipment Home Assistive Devices/Equipment: CPAP, Eyeglasses Home Equipment: None  Prior Device Use: Indicate devices/aids used by the patient prior to current illness, exacerbation or injury? None of the above  Current Functional Level Cognition  Arousal/Alertness: Awake/alert Overall Cognitive Status: Impaired/Different from baseline Difficult to assess due to: Impaired communication Current  Attention Level: Selective Orientation Level: Oriented X4 Following Commands: Follows one step commands consistently Safety/Judgement: Decreased awareness of safety General Comments: A&Ox4 today. In good spirits. Joking. Impaired attention. Attention: Sustained Sustained Attention: Appears intact Memory: Impaired Memory Impairment: Storage deficit, Retrieval deficit Awareness: Impaired Awareness Impairment: Intellectual impairment Problem Solving: Impaired Problem Solving Impairment: Functional basic, Verbal basic Executive Function: Self Monitoring, Self Correcting Safety/Judgment: Impaired    Extremity Assessment (includes Sensation/Coordination)  Upper Extremity Assessment: Generalized weakness RUE Coordination: decreased fine motor, decreased gross motor LUE Coordination: decreased fine motor, decreased gross motor  Lower Extremity Assessment: Generalized weakness    ADLs  Overall ADL's : Needs assistance/impaired Eating/Feeding: Set up, Sitting Grooming: Brushing hair, Minimal assistance, Standing Grooming Details (indicate cue type and reason): supported sitting Upper Body Bathing: Supervision/ safety, Sitting Upper Body Bathing Details (indicate cue type and reason): supported sitting Lower Body Bathing: Moderate assistance, Sit to/from stand Lower Body Bathing Details (indicate cue type and reason): +2 min A sit<>stand Upper Body Dressing : Minimal assistance, Sitting Upper Body Dressing Details (indicate cue type and reason): supported sitting Lower Body Dressing: Maximal assistance, Sit to/from stand Lower Body Dressing Details (indicate cue type and reason): unable to access feet  Toilet Transfer: Minimal assistance, Ambulation, Comfort height toilet, BSC, Grab bars, RW Toilet Transfer Details (indicate cue type and reason): Bil HHA Toileting- Clothing Manipulation and Hygiene: Moderate assistance, Sit to/from stand Toileting - Clothing Manipulation Details (indicate  cue type and reason): +2 min A sit<>stand Functional mobility during ADLs: Minimal assistance, Rolling walker General ADL Comments: requires assist for balance and cognition     Mobility  Overal bed mobility: Needs Assistance Bed Mobility: Rolling, Sidelying to Sit Rolling: Min guard Sidelying to sit: Min guard, HOB elevated General bed mobility comments: Increased effort and time to elevate trunk to get to EOB. No dizziness.     Transfers  Overall transfer level: Needs assistance Equipment used: Rolling walker (2 wheeled) Transfers: Sit to/from Stand Sit to Stand: Min assist  General transfer comment: Min assist to power to stand, and for balance, and cues for hand placement; transferred to chair post ambulation.    Ambulation / Gait / Stairs / Wheelchair Mobility  Ambulation/Gait Ambulation/Gait assistance: Counsellor (Feet): 200 Feet Assistive device: Rolling walker (2 wheeled) Gait Pattern/deviations: Step-through pattern, Shuffle, Decreased stride length, Decreased dorsiflexion - right, Decreased dorsiflexion - left General Gait Details: Pt with slow, shuffling gait pattern. Cues for upright posture and walker proximity. 1 standing rest breaks. 2-3/4 DOE. Sp02 low 80s on RA, donned 3L/min 02 and SP02 remained >89%. HR 90-129 bpm A-fib. Gait velocity: .57 ft/sec Gait velocity interpretation: <1.31 ft/sec, indicative of household ambulator    Posture / Balance Balance Overall balance assessment: Needs assistance Sitting-balance support: Feet supported, No upper extremity supported Sitting balance-Leahy Scale: Good Standing balance support: During functional activity, Bilateral upper extremity supported Standing balance-Leahy Scale: Poor Standing balance comment: Requires external support for standing balance.    Special needs/care consideration BiPAP/CPAP: yes, uses CPAP at home (has home device here in acute) CPM: no Continuous Drip IV: no Dialysis: no         Days: no Life Vest: no Oxygen: no Special Bed: seizure precautions Trach Size: no Wound Vac (area): no      Location: no Skin: surgical incision to head                       Bowel mgmt: last BM: 03/01/19 continent Bladder mgmt:external catheter  Diabetic mgmt: no Behavioral consideration : no Chemo/radiation : no     Previous Home Environment (from acute therapy documentation) Living Arrangements: Hill/significant other  Lives With: Hill Available Help at Discharge: Family, Available 24 hours/day Type of Home: House Home Layout: Two level Alternate Level Stairs-Rails: Right Alternate Level Stairs-Number of Steps: 1 flight Home Access: Stairs to enter Entrance Stairs-Rails: Right Entrance Stairs-Number of Steps: 4 Bathroom Toilet: Standard  Discharge Living Setting Plans for Discharge Living Setting: Patient's home, Lives with (comment)(wife) Type of Home at Discharge: House Discharge Home Layout: One level Discharge Home Access: Stairs to enter Entrance Stairs-Rails: None Entrance Stairs-Number of Steps: 3 Discharge Bathroom Shower/Tub: Tub/shower unit Discharge Bathroom Toilet: Standard Discharge Bathroom Accessibility: Yes How Accessible: Accessible via walker Does the patient have any problems obtaining your medications?: No  Social/Family/Support Systems Patient Roles: Hill Contact Information: wife: Wilburn Cornelia: (home [phone]) [phone]); Edd Arbour (son: [phone]) Anticipated Caregiver: wife Anticipated Caregiver's Contact Information: see above Ability/Limitations of Caregiver: Supervision Caregiver Availability: 24/7 Discharge Plan Discussed with Primary Caregiver: Yes Is Caregiver In Agreement with Plan?: Yes Does Caregiver/Family have Issues with Lodging/Transportation while Pt is in Rehab?: No   Goals/Additional Needs Patient/Family Goal for Rehab: PT/OT/ST: Supervision Expected length of stay: 7-10 days Cultural Considerations:  Christian Dietary Needs: regular diet, thin liquids Equipment Needs: TBD Pt/Family Agrees to Admission and willing to participate: Yes Program Orientation Provided & Reviewed with Pt/Caregiver Including Roles  & Responsibilities: Yes(pt and then wife and son Corene Cornea via phone)  Barriers to Discharge: Home environment access/layout  Barriers to Discharge Comments: steps to enter; tub shower.    Decrease burden of Care through IP rehab admission: NA   Possible need for SNF placement upon discharge: Not anticipated; pt has good family support (confirmed through wife and son) and good prognosis for further progress through CIR level intensity program.    Patient Condition: This patient's medical and functional status has changed since the consult dated: 02/25/19 in which the Rehabilitation Physician  determined and documented that the patient's condition is appropriate for intensive rehabilitative care in an inpatient rehabilitation facility. See "History of Present Illness" (above) for medical update. Functional changes are: improvement in ambulation from Min A 60 feet with RW to Min G 200 ft with RW, improvement in ADLs from Mod/Max A to Min/Max A; still requiring assistance for ADLs due to balance and cognition. Patient's medical and functional status update has been discussed with the Rehabilitation physician and patient remains appropriate for inpatient rehabilitation. Will admit to inpatient rehab today.  Preadmission Screen Completed By:  Jhonnie Garner, OT, 03/02/2019 2:51 PM ______________________________________________________________________   Discussed status with Dr. Naaman Plummer on 6/1 at 2:44PM and received approval for admission today.  Admission Coordinator:  Jhonnie Garner, time 2:44PM/Date: 03/02/19

## 2019-02-26 NOTE — Progress Notes (Signed)
Inpatient Rehabilitation-Admissions Coordinator   Met with pt in the room. He is still in agreement for rehab. AC spoke with pt's wife and son who are also in agreement with CIR and confirmed recommended DC support at home.   AC currently waiting on insurance determination. Hopeful for rehab today if approved.   Jhonnie Garner, OTR/L  Rehab Admissions Coordinator  [phone] 02/26/2019 1:09 PM

## 2019-02-26 NOTE — Progress Notes (Signed)
  Speech Language Pathology Treatment: Dysphagia;Cognitive-Linquistic  Patient Details Name: Preston Hill MRN: [id number] DOB: [birth year] Today's Date: 02/26/2019 Time: 1015-1028 SLP Time Calculation (min) (ACUTE ONLY): 13 min  Assessment / Plan / Recommendation Clinical Impression  Treatment focus on cognition and swallow. He did not recall having MBS Tues and results reviewed. Pt has frequent throat clears during meals/drinks as evidenced with straw sips water today. Throat clearing likely due to delayed transit through esophagus seen on study. Functional mastication with solid. Continue regular texture, thin, esophageal precautions. Will sign off for swallow tx (continue cog).  Pt named strategy to facilitate memory such as writing on calendars- his wife prompts him to look at the calendar at night for the following day. He reports filling pill box independently for the week on Sunday nights. Pt answered hypothetical problem solving questions re: time with mild prompts needed and locating/requesting assistance if needed. Currently being considered for CIR.   HPI HPI: 85 y/o who presented with increasing R-sided weakness from large L chronic subdural hematoma s/p evacuation 5/23. He developed seizure activity post-op and also on 5/24. PMH: Arthritis, Cancer, CKD, HTN, Occasional tremors, OSA, and Tinnitus of both ears.      SLP Plan  All goals met       Recommendations  Diet recommendations: Regular;Thin liquid Liquids provided via: Cup;Straw Medication Administration: Whole meds with puree Supervision: Patient able to self feed Compensations: Small sips/bites;Slow rate Postural Changes and/or Swallow Maneuvers: Seated upright 90 degrees;Upright 30-60 min after meal                Oral Care Recommendations: Oral care BID Follow up Recommendations: Inpatient Rehab SLP Visit Diagnosis: Dysphagia, unspecified (R13.10);Cognitive communication deficit [phone]) Plan: All goals  met                       Houston Siren 02/26/2019, 10:38 AM  Orbie Pyo Colvin Caroli.Ed Risk analyst [phone] Office [phone]

## 2019-02-26 NOTE — Progress Notes (Signed)
Inpatient Rehabilitation-Admissions Coordinator   [facility] still awaiting insurance determination. Will follow up tomorrow 5/29.   Jhonnie Garner, OTR/L  Rehab Admissions Coordinator  [phone] 02/26/2019 4:35 PM

## 2019-02-26 NOTE — Progress Notes (Signed)
Overall doing well.  No headaches.  No further seizures.  Ambulating with a walker.  Wounds clean and dry.  Vital signs stable.  Progressing well following below evacuation of subdural hematoma.  Tolerating oral antiepileptic medications.  Okay for transfer to rehab when insurance approval occurs.

## 2019-02-27 NOTE — Progress Notes (Signed)
Inpatient Rehabilitation-Admissions Coordinator   [facility] has received approval from insurance for admit to CIR. AC will follow for completion of cardiology consult due to A-fib.    Please call if questions.   Jhonnie Garner, OTR/L  Rehab Admissions Coordinator  [phone] 02/27/2019 3:07 PM

## 2019-02-27 NOTE — Progress Notes (Signed)
Inpatient Rehabilitation-Admissions Coordinator   Received call from pt's insurance this AM requesting updated OT treatment note prior to determination for CIR. AC called acute therapy office to request OT visit.   AC will follow up once insurance determination has been made.   Please call if questions.   Jhonnie Garner, OTR/L  Rehab Admissions Coordinator  [phone] 02/27/2019 8:52 AM

## 2019-02-27 NOTE — Progress Notes (Signed)
   Providing Compassionate, Quality Care - Together   Subjective: Patient reports improvement in his strength on the right side. He is now able to write his name with his right hand, which he was unable to do preoperatively. He reports trouble with balance. He denies headache. He is hopeful he will go to CIR today.  Objective: Vital signs in last 24 hours: Temp:  [98.5 F (36.9 C)-99 F (37.2 C)] 99 F (37.2 C) (05/29 1100) Pulse Rate:  [50-104] 92 (05/29 1000) Resp:  [16-25] 18 (05/29 1000) BP: (119-172)/(87-117) 125/87 (05/29 1000) SpO2:  [94 %-100 %] 100 % (05/29 1000)  Intake/Output from previous day: 05/28 0701 - 05/29 0700 In: 1950 [I.V.:1950] Out: 2000 [Urine:2000] Intake/Output this shift: Total I/O In: 150 [I.V.:150] Out: -   Alert and oriented x 4 Motor and sensory exam intact PERRLA Incision on left scalp with staples and old scab, no new draiange  Lab Results: No results for input(s): WBC, HGB, HCT, PLT in the last 72 hours. BMET Recent Labs    02/25/19 0427 02/26/19 0940  NA 141 140  K 3.0* 3.4*  CL 103 104  CO2 26 25  GLUCOSE 121* 161*  BUN 18 11  CREATININE 0.79 0.77  CALCIUM 8.7* 8.8*    Studies/Results: No results found.  Assessment/Plan: Patient presented 5/[facility] to [facility] with changes in balance and right-sided weakness. He was found to have a large L SDH on CT. SDH evacuated via burr holes on the evening of 02/21/2019. Patient started on Keppra for seizure prophylaxis. He had two clinical seizures 5/23 and 5/24. Dilantin was added. No further seizures. Therapies recommending CIR. Currently pending insurance approval.   LOS: 6 days    -Continue to mobilize -Remove staples on 03/02/2019   Viona Gilmore, DNP, AGNP-C Nurse Practitioner  [facility] [place]. [address], [place], [place], [place] [address] P: [phone]    F: [phone]  02/27/2019, 12:00 PM

## 2019-02-27 NOTE — Progress Notes (Signed)
Physical Therapy Treatment Patient Details Name: Preston Hill MRN: [id number] DOB: March 09, 1934 Today's Date: 02/27/2019    History of Present Illness Patient is a 85 y/o male who presents with progressive RUE/LE weakness. Found to left sided SDH with mass effect s/p burr hole evacuation and placement of subdural drain 5/23. Postoperatively had seizures x3. PMH includes HTN, CKD, skin ca.     PT Comments    Pt with notable improvements in activity tolerance as evidenced by decreased dyspnea on exertion and increased ambulation distance. Requiring min assist for transfers and min guard assist for ambulating in hallway with walker. Continues with shuffling gait pattern and decreased gait speed, indicating he is at high fall risk. Continue to recommend comprehensive inpatient rehab (CIR) for post-acute therapy needs.    Follow Up Recommendations  CIR;Supervision for mobility/OOB;Supervision/Assistance - 24 hour     Equipment Recommendations  Rolling walker with 5" wheels    Recommendations for Other Services       Precautions / Restrictions Precautions Precautions: Fall Restrictions Weight Bearing Restrictions: No    Mobility  Bed Mobility               General bed mobility comments: OOB in chair  Transfers Overall transfer level: Needs assistance Equipment used: Rolling walker (2 wheeled) Transfers: Sit to/from Stand Sit to Stand: Min assist         General transfer comment: Min assist to power to stand, and for balance, and cues for hand placement   Ambulation/Gait Ambulation/Gait assistance: Min guard Gait Distance (Feet): 250 Feet Assistive device: Rolling walker (2 wheeled) Gait Pattern/deviations: Step-through pattern;Shuffle;Decreased stride length;Decreased dorsiflexion - right;Decreased dorsiflexion - left Gait velocity: 0.65 ft/s Gait velocity interpretation: <1.31 ft/sec, indicative of household ambulator General Gait Details: Pt with slow, shuffling  gait pattern. Cues for upright posture and walker proximity.    Stairs             Wheelchair Mobility    Modified Rankin (Stroke Patients Only) Modified Rankin (Stroke Patients Only) Pre-Morbid Rankin Score: No significant disability Modified Rankin: Moderately severe disability     Balance Overall balance assessment: Needs assistance Sitting-balance support: Feet supported;No upper extremity supported Sitting balance-Leahy Scale: Good     Standing balance support: During functional activity;Bilateral upper extremity supported Standing balance-Leahy Scale: Poor Standing balance comment: Requires external support for standing balance.                            Cognition Arousal/Alertness: Awake/alert Behavior During Therapy: Flat affect Overall Cognitive Status: Impaired/Different from baseline Area of Impairment: Attention;Memory;Problem solving;Safety/judgement                   Current Attention Level: Selective;Sustained Memory: Decreased short-term memory Following Commands: Follows one step commands consistently;Follows multi-step commands inconsistently Safety/Judgement: Decreased awareness of safety   Problem Solving: Slow processing;Requires verbal cues;Difficulty sequencing General Comments: Decreased short term recall and recognition of deficits.       Exercises      General Comments General comments (skin integrity, edema, etc.): VSS.  DOE 3/4       Pertinent Vitals/Pain Pain Assessment: Faces Faces Pain Scale: No hurt    Home Living                      Prior Function            PT Goals (current goals can now be found in the  care plan section) Acute Rehab PT Goals Potential to Achieve Goals: Good Progress towards PT goals: Progressing toward goals    Frequency    Min 4X/week      PT Plan Current plan remains appropriate    Co-evaluation              AM-PAC PT "6 Clicks" Mobility    Outcome Measure  Help needed turning from your back to your side while in a flat bed without using bedrails?: A Little Help needed moving from lying on your back to sitting on the side of a flat bed without using bedrails?: A Lot Help needed moving to and from a bed to a chair (including a wheelchair)?: A Little Help needed standing up from a chair using your arms (e.g., wheelchair or bedside chair)?: A Little Help needed to walk in hospital room?: A Little Help needed climbing 3-5 steps with a railing? : A Lot 6 Click Score: 16    End of Session   Activity Tolerance: Patient tolerated treatment well Patient left: in chair;with call bell/phone within reach;with chair alarm set;with nursing/sitter in room Nurse Communication: Mobility status PT Visit Diagnosis: Unsteadiness on feet (R26.81);Muscle weakness (generalized) (M62.81);Difficulty in walking, not elsewhere classified (R26.2)     Time: 3614-4315 PT Time Calculation (min) (ACUTE ONLY): 23 min  Charges:  $Gait Training: 23-37 mins                     Ellamae Sia, [place], DPT Acute Rehabilitation Services Pager [phone] Office [phone]    Willy Eddy 02/27/2019, 2:36 PM

## 2019-02-27 NOTE — Progress Notes (Signed)
Occupational Therapy Treatment Patient Details Name: Preston Hill MRN: [id number] DOB: 1933/12/08 Today's Date: 02/27/2019    History of present illness Patient is a 85 y/o male who presents with progressive RUE/LE weakness. Found to left sided SDH with mass effect s/p burr hole evacuation and placement of subdural drain 5/23. Postoperatively had seizures x3. PMH includes HTN, CKD, skin ca.    OT comments  Pt progressing well toward goals.  He now requires min A for grooming at sink, and UB ADLs; mod A for LB ADLs.  He requires min A for functional transfers, and is at risk for falls.  He requires min cues due to cognitive deficits.  Continue to recommend CIR as he will benefit from a team of therapists that specialize in brain injury rehab to allow him to maximize independence and safety with ADLs, reduce risk of falls, and reduce risk of readmission.    Follow Up Recommendations  CIR;Supervision/Assistance - 24 hour    Equipment Recommendations  Other (comment)    Recommendations for Other Services Rehab consult    Precautions / Restrictions Precautions Precautions: Fall Restrictions Weight Bearing Restrictions: No       Mobility Bed Mobility               General bed mobility comments: OOB in chair  Transfers Overall transfer level: Needs assistance Equipment used: Rolling walker (2 wheeled) Transfers: Sit to/from Stand Sit to Stand: Min assist         General transfer comment: Min assist to power to stand, and for balance, and cues for hand placement     Balance Overall balance assessment: Needs assistance Sitting-balance support: Feet supported;No upper extremity supported Sitting balance-Leahy Scale: Good     Standing balance support: During functional activity;Bilateral upper extremity supported Standing balance-Leahy Scale: Poor Standing balance comment: Requires external support for standing balance.                           ADL either  performed or assessed with clinical judgement   ADL Overall ADL's : Needs assistance/impaired Eating/Feeding: Set up;Sitting   Grooming: Brushing hair;Minimal assistance;Standing   Upper Body Bathing: Supervision/ safety;Sitting   Lower Body Bathing: Moderate assistance;Sit to/from stand   Upper Body Dressing : Minimal assistance;Sitting   Lower Body Dressing: Maximal assistance;Sit to/from stand Lower Body Dressing Details (indicate cue type and reason): unable to access feet  Toilet Transfer: Minimal assistance;Ambulation;Comfort height toilet;BSC;Grab bars;RW   Toileting- Clothing Manipulation and Hygiene: Moderate assistance;Sit to/from stand       Functional mobility during ADLs: Minimal assistance;Rolling walker General ADL Comments: requires assist for balance and cognition      Vision       Perception     Praxis      Cognition Arousal/Alertness: Awake/alert Behavior During Therapy: Flat affect Overall Cognitive Status: Impaired/Different from baseline Area of Impairment: Attention;Memory;Problem solving;Safety/judgement                   Current Attention Level: Selective;Sustained Memory: Decreased short-term memory Following Commands: Follows one step commands consistently;Follows multi-step commands inconsistently Safety/Judgement: Decreased awareness of safety   Problem Solving: Slow processing;Requires verbal cues;Difficulty sequencing General Comments: Pt demonstrates impaired awareness of deficits and how they impact function.  States his wife couldn't hear him while talking on the phone, but was unable to hold phone fully to his Lt ear which was the UE he was using to hold the phone.  Exercises     Shoulder Instructions       General Comments VSS.  DOE 3/4     Pertinent Vitals/ Pain       Pain Assessment: Faces Faces Pain Scale: No hurt  Home Living                                          Prior  Functioning/Environment              Frequency  Min 3X/week        Progress Toward Goals  OT Goals(current goals can now be found in the care plan section)  Progress towards OT goals: Progressing toward goals     Plan Discharge plan remains appropriate    Co-evaluation                 AM-PAC OT "6 Clicks" Daily Activity     Outcome Measure   Help from another person eating meals?: A Little Help from another person taking care of personal grooming?: A Little Help from another person toileting, which includes using toliet, bedpan, or urinal?: A Lot Help from another person bathing (including washing, rinsing, drying)?: A Lot Help from another person to put on and taking off regular upper body clothing?: A Little Help from another person to put on and taking off regular lower body clothing?: A Lot 6 Click Score: 15    End of Session    OT Visit Diagnosis: Unsteadiness on feet (R26.81);Other abnormalities of gait and mobility (R26.89);Muscle weakness (generalized) (M62.81);Other symptoms and signs involving cognitive function;Cognitive communication deficit (R41.841);Pain   Activity Tolerance Patient tolerated treatment well   Patient Left in chair;with call bell/phone within reach;with chair alarm set   Nurse Communication Mobility status        Time: 1128-1150 OT Time Calculation (min): 22 min  Charges: OT General Charges $OT Visit: 1 Visit OT Treatments $Self Care/Home Management : 8-22 mins  Lucille Passy, OTR/L [place] Pager [phone] Office [phone]    Lucille Passy M 02/27/2019, 11:50 AM

## 2019-02-28 ENCOUNTER — Inpatient Hospital Stay (HOSPITAL_COMMUNITY): Payer: Medicare HMO

## 2019-02-28 ENCOUNTER — Inpatient Hospital Stay (HOSPITAL_COMMUNITY): Payer: Medicare HMO | Admitting: Speech Pathology

## 2019-02-28 ENCOUNTER — Inpatient Hospital Stay (HOSPITAL_COMMUNITY): Payer: Medicare HMO | Admitting: Occupational Therapy

## 2019-02-28 DIAGNOSIS — I4891 Unspecified atrial fibrillation: Secondary | ICD-10-CM

## 2019-02-28 DIAGNOSIS — R739 Hyperglycemia, unspecified: Secondary | ICD-10-CM

## 2019-02-28 DIAGNOSIS — E876 Hypokalemia: Secondary | ICD-10-CM

## 2019-02-28 MED ORDER — DILTIAZEM HCL-DEXTROSE 100-5 MG/100ML-% IV SOLN (PREMIX)
5.0000 mg/h | INTRAVENOUS | Status: DC
  Administered 2019-02-28 – 2019-03-01 (×2): 5 mg/h via INTRAVENOUS
  Filled 2019-02-28 (×3): qty 100

## 2019-02-28 MED ORDER — PANTOPRAZOLE SODIUM 40 MG PO TBEC
40.0000 mg | DELAYED_RELEASE_TABLET | Freq: Every day | ORAL | Status: DC
  Administered 2019-02-28 – 2019-03-02 (×3): 40 mg via ORAL
  Filled 2019-02-28 (×3): qty 1

## 2019-02-28 MED ORDER — DIGOXIN 0.25 MG/ML IJ SOLN
0.2500 mg | Freq: Every day | INTRAMUSCULAR | Status: AC
  Administered 2019-02-28: 0.25 mg via INTRAVENOUS
  Filled 2019-02-28: qty 2

## 2019-02-28 NOTE — Progress Notes (Signed)
Patient ID: Preston Hill, male   DOB: [birth year], 85 y.o.   MRN: [id number] Eitel signs are stable and patient is awake and alert Awaiting transfer to CIR however nurse informs me that he will require cardiology evaluation for new onset A. fib prior to being accepted for transfer to CIR.  I will request cardiology consult today.

## 2019-02-28 NOTE — Progress Notes (Signed)
[place] PHYSICAL MEDICINE & REHABILITATION PROGRESS NOTE  Subjective/Complaints: Patient seen sitting up in his chair this morning.  He appears to be comfortable.  He is questions regarding plan for rehab.  Discussed cardiology evaluation with patient and nursing - remains pending.  ROS: Denies chest pain, shortness of breath, nausea, vomiting, diarrhea, palpitations, diaphoresis.  Objective: Vital Signs: Blood pressure (!) 139/94, pulse (!) 103, temperature 98.8 F (37.1 C), temperature source Axillary, resp. rate (!) 25, height 5\' 6"  (1.676 m), weight 80.6 kg, SpO2 96 %. No results found. No results for input(s): WBC, HGB, HCT, PLT in the last 72 hours. Recent Labs    02/26/19 0940  NA 140  K 3.4*  CL 104  CO2 25  GLUCOSE 161*  BUN 11  CREATININE 0.77  CALCIUM 8.8*    Physical Exam: BP (!) 139/94   Pulse (!) 103   Temp 98.8 F (37.1 C) (Axillary)   Resp (!) 25   Ht 5\' 6"  (1.676 m)   Wt 80.6 kg   SpO2 96%   BMI 28.68 kg/m  Constitutional: No distress . Vital signs reviewed. HENT: Scalp incision Eyes: EOMI. No discharge. Cardiovascular: No JVD. Respiratory: Normal effort. GI: Non-distended. Musc: No edema or tenderness in extremities. Motor: Grossly 4+-5/5 throughout  Assessment/Plan:  85 year old with past medical history of hypertension, CKD, OSA, tremors presents with acute on chronic subdural hematoma.  1.  Acute on chronic subdural hematoma  Continue PT/OT/SLP  Will follow-up regarding CIR admission on Monday  Discussed with admission coordinator, nursing, patient  2.  Atrial fibrillation with RVR  Heart rate noted to be in 130s at rest, especially given 85 and comorbidities  Patient will not be able to tolerate CIR given current heart rate  Continue to await cards eval-would like to see patient active with heart rate controlled  3.  Hyperglycemia  Consider hemoglobin A1c  4.  Hypokalemia  Repeat labs      LOS: 7 days A FACE TO FACE  EVALUATION WAS PERFORMED  Preston Hill Preston Hill 02/28/2019, 1:43 PM

## 2019-02-28 NOTE — Progress Notes (Signed)
Patient has home CPAP and places himself on and off as needed.

## 2019-02-28 NOTE — Consult Note (Signed)
Cardiology Consultation:   Patient ID: Preston Hill MRN: [id number]; DOB: 25-Aug-1934  Admit date: 02/21/2019 Date of Consult: 02/28/2019  Primary Care Provider: Albina Billet, MD Primary Cardiologist: No primary care provider on file. none Primary Electrophysiologist:  None    Patient Profile:   Preston Hill is a 85 y.o. male with a hx of dyslipidemia and first degree aV block who is being seen today for the evaluation of atrial fib with a  RVR at the request of Dr. Posey Pronto  History of Present Illness:   Preston Hill is an 85 yo man with HTN and dyslipidemia who was admitted to the hospital with progressive weakness and neuro changes who underwent burr hole placement after being found to have a large subdural hematoma and has developed atrial fib with a RVR. He does not feel palpitations but has noted to be a little bit weaker.   Past Medical History:  Diagnosis Date  . Arthritis   . Cancer ([place])    Skin Cancer  . Chronic kidney disease   . Enlarged prostate   . Hypertension   . Occasional tremors   . Peripheral edema   . Sleep apnea    OSA---USE C-PAP  . Subdural hematoma ([place]) 02/21/2019  . Tinnitus of both ears     Past Surgical History:  Procedure Laterality Date  . BURR HOLE Left 02/21/2019   Procedure: BURR HOLES, Placement of Subdural drain;  Surgeon: Earnie Larsson, MD;  Location: [facility];  Service: Neurosurgery;  Laterality: Left;  . COLONOSCOPY  3817/2006  . COLONOSCOPY WITH PROPOFOL N/A 02/01/2016   Procedure: COLONOSCOPY WITH PROPOFOL;  Surgeon: Robert Bellow, MD;  Location: [facility] ENDOSCOPY;  Service: Endoscopy;  Laterality: N/A;  . EYE SURGERY Right    Cataract Extraction with IOL  . HERNIA REPAIR Left 1964   Inguinal Hernia Repair  . KNEE ARTHROSCOPY Left   . KNEE ARTHROSCOPY Right 06/26/2017   Procedure: ARTHROSCOPY KNEE WITH DEBRIDEMENT AND LOOSE BODY REMOVAL;  Surgeon: Corky Mull, MD;  Location: [facility];  Service: Orthopedics;  Laterality: Right;   . KNEE ARTHROSCOPY WITH MEDIAL MENISECTOMY Left 05/02/2017   Procedure: KNEE ARTHROSCOPY WITH MEDIAL MENISECTOMY;  Surgeon: Corky Mull, MD;  Location: [facility] ORS;  Service: Orthopedics;  Laterality: Left;  . KNEE ARTHROSCOPY WITH MENISCAL REPAIR  05/02/2017   Procedure: KNEE ARTHROSCOPY WITH MENISCAL REPAIR;  Surgeon: Corky Mull, MD;  Location: [facility] ORS;  Service: Orthopedics;;  . SEPTOPLASTY  2003  . uvul  2004   repair     Home Medications:  Prior to Admission medications   Medication Sig Start Date End Date Taking? Authorizing Provider  acetaminophen (TYLENOL) 500 MG tablet Take 1,000 mg by mouth every 6 (six) hours as needed (for pain.).    [provider]  amLODipine (NORVASC) 5 MG tablet Take 10 mg by mouth daily.     [provider]  aspirin EC 81 MG tablet Take 81 mg by mouth daily.    [provider]  Glucosamine Sulfate 1000 MG CAPS Take 2,000 mg by mouth daily.     [provider]  ibuprofen (ADVIL,MOTRIN) 200 MG tablet Take 400 mg by mouth every 8 (eight) hours as needed (for pain.).    [provider]  lisinopril (PRINIVIL,ZESTRIL) 20 MG tablet Take 20 mg by mouth daily. 03/06/17   [provider]  potassium chloride SA (K-DUR,KLOR-CON) 20 MEQ tablet Take 20 mEq by mouth 2 (two) times daily.  [provider]  propranolol (INDERAL) 40 MG tablet Take 40 mg by mouth 2 (two) times daily.    [provider]  tamsulosin (FLOMAX) 0.4 MG CAPS capsule Take 0.8 mg by mouth at bedtime.     [provider]  vitamin E (VITAMIN E) 400 UNIT capsule Take 400 Units by mouth daily.    [provider]    Inpatient Medications: Scheduled Meds: . Chlorhexidine Gluconate Cloth  6 each Topical Daily  . levETIRAcetam  1,000 mg Oral BID  . mouth rinse  15 mL Mouth Rinse BID  . pantoprazole  40 mg Oral Daily  . phenytoin  100 mg Oral TID   Continuous Infusions: . sodium chloride 75 mL/hr at 02/25/19 1235   . sodium chloride     PRN Meds: sodium chloride, acetaminophen **OR** acetaminophen, HYDROcodone-acetaminophen, HYDROmorphone (DILAUDID) injection, labetalol, LORazepam, ondansetron **OR** ondansetron (ZOFRAN) IV, promethazine  Allergies:    Allergies  Allergen Reactions  . Codeine Anxiety    Sick     Social History:   Social History   Socioeconomic History  . Marital status: Married    Spouse name: Not on file  . Number of children: Not on file  . Years of education: Not on file  . Highest education level: Not on file  Occupational History  . Not on file  Social Needs  . Financial resource strain: Not on file  . Food insecurity:    Worry: Not on file    Inability: Not on file  . Transportation needs:    Medical: Not on file    Non-medical: Not on file  Tobacco Use  . Smoking status: Never Smoker  . Smokeless tobacco: Never Used  Substance and Sexual Activity  . Alcohol use: No    Alcohol/week: 0.0 standard drinks  . Drug use: No  . Sexual activity: Not on file  Lifestyle  . Physical activity:    Days per week: Not on file    Minutes per session: Not on file  . Stress: Not on file  Relationships  . Social connections:    Talks on phone: Not on file    Gets together: Not on file    Attends religious service: Not on file    Active member of club or organization: Not on file    Attends meetings of clubs or organizations: Not on file    Relationship status: Not on file  . Intimate partner violence:    Fear of current or ex partner: Not on file    Emotionally abused: Not on file    Physically abused: Not on file    Forced sexual activity: Not on file  Other Topics Concern  . Not on file  Social History Narrative  . Not on file    Family History:    Family History  Problem Relation Age of Onset  . Hypertension Mother   . Alzheimer's disease Mother      ROS:  Please see the history of present illness.   All other ROS reviewed and negative.      Physical Exam/Data:   Vitals:   02/28/19 0900 02/28/19 1000 02/28/19 1100 02/28/19 1200  BP: 117/81 129/79 (!) 143/84 (!) 139/94  Pulse: 95 87 (!) 107 (!) 103  Resp: (!) 23 (!) 23 (!) 26 (!) 25  Temp:      TempSrc:      SpO2: 94% 95% 97% 96%  Weight:      Height:  Intake/Output Summary (Last 24 hours) at 02/28/2019 1410 Last data filed at 02/28/2019 0600 Gross per 24 hour  Intake 450 ml  Output 850 ml  Net -400 ml   Last 3 Weights 02/21/2019 02/21/2019 06/26/2017  Weight (lbs) 177 lb 11.1 oz 195 lb 192 lb  Weight (kg) 80.6 kg 88.451 kg 87.091 kg     Body mass index is 28.68 kg/m.  General:  Elderly but well nourished, well developed, in no acute distress HEENT: normal Lymph: no adenopathy Neck: no JVD Endocrine:  No thryomegaly Vascular: No carotid bruits; FA pulses 2+ bilaterally without bruits  Cardiac:  normal S1, S2; IRIRR; no murmur  Lungs:  clear to auscultation bilaterally, no wheezing, rhonchi or rales  Abd: soft, nontender, no hepatomegaly  Ext: no edema Musculoskeletal:  No deformities, BUE and BLE strength normal and equal Skin: warm and dry  Neuro:  CNs 2-12 intact, no focal abnormalities noted Psych:  Normal affect   EKG:  The EKG was personally reviewed and demonstrates:  nsr Telemetry:  Telemetry was personally reviewed and demonstrates:  Atrial fib with a RVR  Relevant CV Studies:   Laboratory Data:  Chemistry Recent Labs  Lab 02/24/19 0910 02/25/19 0427 02/26/19 0940  NA 140 141 140  K 3.0* 3.0* 3.4*  CL 102 103 104  CO2 21* 26 25  GLUCOSE 84 121* 161*  BUN 15 18 11   CREATININE 0.89 0.79 0.77  CALCIUM 9.2 8.7* 8.8*  GFRNONAA >60 >60 >60  GFRAA >60 >60 >60  ANIONGAP 17* 12 11    No results for input(s): PROT, ALBUMIN, AST, ALT, ALKPHOS, BILITOT in the last 168 hours. Hematology Recent Labs  Lab 02/22/19 0224 02/23/19 0916  WBC 9.9 8.7  RBC 4.52 3.96*  HGB 14.0 12.0*  HCT 41.0 35.0*  MCV 90.7 88.4  MCH 31.0 30.3  MCHC  34.1 34.3  RDW 12.3 12.3  PLT 182 154   Cardiac EnzymesNo results for input(s): TROPONINI in the last 168 hours. No results for input(s): TROPIPOC in the last 168 hours.  BNPNo results for input(s): BNP, PROBNP in the last 168 hours.  DDimer No results for input(s): DDIMER in the last 168 hours.  Radiology/Studies:  No results found.  Assessment and Plan:   1. Atrial fib - his rate is not well controlled. He is not a candidate for systemic anti-coagulation due to SDH. I am not sure he will ever be. For now, he will need AV nodal blocking agents for better rate control.  2. First degree AV block - we will need to be careful with his rate control. He has baseline conduction system disease. 3. Sleep apnea - probably contributing to the propensity for atrial fib.   For questions or updates, please contact [place] Please consult [email or web address] for contact info under   Signed, Cristopher Peru, MD  02/28/2019 2:10 PM

## 2019-02-28 NOTE — Progress Notes (Signed)
Inpatient Rehabilitation-Admissions Coordinator   Spoke with PM&R MD Dr. Delice Lesch regarding possible admission to CIR today. We will hold on admission til Monday and reassess based on cardiology consult.   Please call if questions.   Jhonnie Garner, OTR/L  Rehab Admissions Coordinator  [phone] 02/28/2019 10:48 AM

## 2019-03-01 ENCOUNTER — Inpatient Hospital Stay (HOSPITAL_COMMUNITY): Payer: Medicare HMO

## 2019-03-01 NOTE — Progress Notes (Signed)
Patient ID: Preston Hill, male   DOB: 02/20/1934, 85 y.o.   MRN: [id number] Patient's vital signs remained stable with his heart rate around 100. His neurologic status remains good Hopefully he will be able to transfer to rehabilitation soon as his condition continues to improve steadily on its own.

## 2019-03-01 NOTE — Progress Notes (Signed)
Pt on home CPAP

## 2019-03-02 ENCOUNTER — Inpatient Hospital Stay (HOSPITAL_COMMUNITY): Payer: Medicare HMO

## 2019-03-02 ENCOUNTER — Inpatient Hospital Stay (HOSPITAL_COMMUNITY): Payer: Medicare HMO | Admitting: Occupational Therapy

## 2019-03-02 ENCOUNTER — Inpatient Hospital Stay (HOSPITAL_COMMUNITY)
Admission: RE | Admit: 2019-03-02 | Discharge: 2019-03-11 | DRG: 057 | Disposition: A | Discharge status: Home Health | Payer: Medicare HMO | Source: Intra-hospital | Attending: Physical Medicine & Rehabilitation | Admitting: Physical Medicine & Rehabilitation

## 2019-03-02 ENCOUNTER — Encounter (HOSPITAL_COMMUNITY): Payer: Self-pay | Admitting: *Deleted

## 2019-03-02 ENCOUNTER — Inpatient Hospital Stay (HOSPITAL_COMMUNITY): Payer: Medicare HMO | Admitting: Physical Therapy

## 2019-03-02 ENCOUNTER — Other Ambulatory Visit: Payer: Self-pay

## 2019-03-02 DIAGNOSIS — I69220 Aphasia following other nontraumatic intracranial hemorrhage: Secondary | ICD-10-CM | POA: Diagnosis not present

## 2019-03-02 DIAGNOSIS — Z820 Family history of epilepsy and other diseases of the nervous system: Secondary | ICD-10-CM | POA: Diagnosis not present

## 2019-03-02 DIAGNOSIS — S065X9A Traumatic subdural hemorrhage with loss of consciousness of unspecified duration, initial encounter: Secondary | ICD-10-CM

## 2019-03-02 DIAGNOSIS — S065XAA Traumatic subdural hemorrhage with loss of consciousness status unknown, initial encounter: Secondary | ICD-10-CM | POA: Diagnosis present

## 2019-03-02 DIAGNOSIS — E46 Unspecified protein-calorie malnutrition: Secondary | ICD-10-CM

## 2019-03-02 DIAGNOSIS — R509 Fever, unspecified: Secondary | ICD-10-CM

## 2019-03-02 DIAGNOSIS — I69218 Other symptoms and signs involving cognitive functions following other nontraumatic intracranial hemorrhage: Secondary | ICD-10-CM | POA: Diagnosis not present

## 2019-03-02 DIAGNOSIS — Z7982 Long term (current) use of aspirin: Secondary | ICD-10-CM

## 2019-03-02 DIAGNOSIS — H538 Other visual disturbances: Secondary | ICD-10-CM | POA: Diagnosis present

## 2019-03-02 DIAGNOSIS — G4733 Obstructive sleep apnea (adult) (pediatric): Secondary | ICD-10-CM | POA: Diagnosis present

## 2019-03-02 DIAGNOSIS — R0989 Other specified symptoms and signs involving the circulatory and respiratory systems: Secondary | ICD-10-CM | POA: Diagnosis present

## 2019-03-02 DIAGNOSIS — R0902 Hypoxemia: Secondary | ICD-10-CM | POA: Diagnosis present

## 2019-03-02 DIAGNOSIS — E876 Hypokalemia: Secondary | ICD-10-CM | POA: Diagnosis not present

## 2019-03-02 DIAGNOSIS — N400 Enlarged prostate without lower urinary tract symptoms: Secondary | ICD-10-CM

## 2019-03-02 DIAGNOSIS — R569 Unspecified convulsions: Secondary | ICD-10-CM

## 2019-03-02 DIAGNOSIS — H9313 Tinnitus, bilateral: Secondary | ICD-10-CM | POA: Diagnosis present

## 2019-03-02 DIAGNOSIS — R2689 Other abnormalities of gait and mobility: Secondary | ICD-10-CM

## 2019-03-02 DIAGNOSIS — D62 Acute posthemorrhagic anemia: Secondary | ICD-10-CM

## 2019-03-02 DIAGNOSIS — Z9981 Dependence on supplemental oxygen: Secondary | ICD-10-CM

## 2019-03-02 DIAGNOSIS — R609 Edema, unspecified: Secondary | ICD-10-CM | POA: Diagnosis not present

## 2019-03-02 DIAGNOSIS — R7303 Prediabetes: Secondary | ICD-10-CM | POA: Diagnosis present

## 2019-03-02 DIAGNOSIS — Z8249 Family history of ischemic heart disease and other diseases of the circulatory system: Secondary | ICD-10-CM

## 2019-03-02 DIAGNOSIS — R7309 Other abnormal glucose: Secondary | ICD-10-CM | POA: Diagnosis not present

## 2019-03-02 DIAGNOSIS — I4891 Unspecified atrial fibrillation: Secondary | ICD-10-CM | POA: Diagnosis present

## 2019-03-02 DIAGNOSIS — G4730 Sleep apnea, unspecified: Secondary | ICD-10-CM | POA: Diagnosis present

## 2019-03-02 DIAGNOSIS — I10 Essential (primary) hypertension: Secondary | ICD-10-CM

## 2019-03-02 DIAGNOSIS — I69251 Hemiplegia and hemiparesis following other nontraumatic intracranial hemorrhage affecting right dominant side: Principal | ICD-10-CM

## 2019-03-02 DIAGNOSIS — I69298 Other sequelae of other nontraumatic intracranial hemorrhage: Secondary | ICD-10-CM | POA: Diagnosis present

## 2019-03-02 DIAGNOSIS — S065X0S Traumatic subdural hemorrhage without loss of consciousness, sequela: Secondary | ICD-10-CM

## 2019-03-02 DIAGNOSIS — R4189 Other symptoms and signs involving cognitive functions and awareness: Secondary | ICD-10-CM

## 2019-03-02 LAB — URINALYSIS, ROUTINE W REFLEX MICROSCOPIC
Bilirubin Urine: NEGATIVE
Glucose, UA: NEGATIVE mg/dL
Ketones, ur: NEGATIVE mg/dL
Nitrite: NEGATIVE
Protein, ur: 100 mg/dL — AB
Specific Gravity, Urine: 1.018 (ref 1.005–1.030)
pH: 6 (ref 5.0–8.0)

## 2019-03-02 LAB — COMPREHENSIVE METABOLIC PANEL
ALT: 48 U/L — ABNORMAL HIGH (ref 0–44)
AST: 43 U/L — ABNORMAL HIGH (ref 15–41)
Albumin: 2.9 g/dL — ABNORMAL LOW (ref 3.5–5.0)
Alkaline Phosphatase: 52 U/L (ref 38–126)
Anion gap: 13 (ref 5–15)
BUN: 20 mg/dL (ref 8–23)
CO2: 27 mmol/L (ref 22–32)
Calcium: 8.8 mg/dL — ABNORMAL LOW (ref 8.9–10.3)
Chloride: 95 mmol/L — ABNORMAL LOW (ref 98–111)
Creatinine, Ser: 0.55 mg/dL — ABNORMAL LOW (ref 0.61–1.24)
GFR calc Af Amer: >60 mL/min (ref >60)
GFR calc non Af Amer: >60 mL/min (ref >60)
Glucose, Bld: 27 mg/dL — CL (ref 70–99)
Potassium: 3.2 mmol/L — ABNORMAL LOW (ref 3.5–5.1)
Sodium: 135 mmol/L (ref 135–145)
Total Bilirubin: 0.6 mg/dL (ref 0.3–1.2)
Total Protein: 5.9 g/dL — ABNORMAL LOW (ref 6.5–8.1)

## 2019-03-02 LAB — PHENYTOIN LEVEL, TOTAL: Phenytoin Lvl: 6.1 ug/mL — ABNORMAL LOW (ref 10.0–20.0)

## 2019-03-02 LAB — GLUCOSE, CAPILLARY: Glucose-Capillary: 90 mg/dL (ref 70–99)

## 2019-03-02 MED ORDER — POTASSIUM CHLORIDE CRYS ER 20 MEQ PO TBCR
20.0000 meq | EXTENDED_RELEASE_TABLET | Freq: Two times a day (BID) | ORAL | Status: AC
  Administered 2019-03-02 – 2019-03-04 (×4): 20 meq via ORAL
  Filled 2019-03-02 (×4): qty 1

## 2019-03-02 MED ORDER — PHENYTOIN SODIUM EXTENDED 100 MG PO CAPS
100.0000 mg | ORAL_CAPSULE | Freq: Every day | ORAL | Status: DC
  Filled 2019-03-02: qty 1

## 2019-03-02 MED ORDER — METOPROLOL TARTRATE 25 MG PO TABS
12.5000 mg | ORAL_TABLET | Freq: Two times a day (BID) | ORAL | Status: DC
  Administered 2019-03-02: 11:00:00 12.5 mg via ORAL
  Filled 2019-03-02: qty 1

## 2019-03-02 MED ORDER — CHLORHEXIDINE GLUCONATE 0.12 % MT SOLN
15.0000 mL | Freq: Two times a day (BID) | OROMUCOSAL | Status: DC
  Administered 2019-03-02 – 2019-03-11 (×18): 15 mL via OROMUCOSAL
  Filled 2019-03-02 (×18): qty 15

## 2019-03-02 MED ORDER — PHENYTOIN SODIUM EXTENDED 30 MG PO CAPS
130.0000 mg | ORAL_CAPSULE | Freq: Two times a day (BID) | ORAL | Status: DC
  Filled 2019-03-02: qty 1

## 2019-03-02 MED ORDER — PROCHLORPERAZINE EDISYLATE 10 MG/2ML IJ SOLN: 5.0000 mg | Freq: Four times a day (QID) | INTRAMUSCULAR | Status: DC | PRN

## 2019-03-02 MED ORDER — TAMSULOSIN HCL 0.4 MG PO CAPS
0.8000 mg | ORAL_CAPSULE | Freq: Every day | ORAL | Status: DC
  Administered 2019-03-02 – 2019-03-10 (×9): 0.8 mg via ORAL
  Filled 2019-03-02 (×10): qty 2

## 2019-03-02 MED ORDER — GUAIFENESIN-DM 100-10 MG/5ML PO SYRP: 5.0000 mL | ORAL_SOLUTION | Freq: Four times a day (QID) | ORAL | Status: DC | PRN

## 2019-03-02 MED ORDER — LEVETIRACETAM 500 MG PO TABS
1000.0000 mg | ORAL_TABLET | Freq: Two times a day (BID) | ORAL | Status: DC
  Administered 2019-03-02 – 2019-03-11 (×18): 1000 mg via ORAL
  Filled 2019-03-02 (×19): qty 2

## 2019-03-02 MED ORDER — ALUM & MAG HYDROXIDE-SIMETH 200-200-20 MG/5ML PO SUSP: 30.0000 mL | ORAL | Status: DC | PRN

## 2019-03-02 MED ORDER — POLYETHYLENE GLYCOL 3350 17 G PO PACK: 17.0000 g | PACK | Freq: Every day | ORAL | Status: DC | PRN

## 2019-03-02 MED ORDER — METOPROLOL TARTRATE 12.5 MG HALF TABLET
12.5000 mg | ORAL_TABLET | Freq: Two times a day (BID) | ORAL | Status: DC
  Administered 2019-03-02 – 2019-03-05 (×6): 12.5 mg via ORAL
  Filled 2019-03-02 (×6): qty 1

## 2019-03-02 MED ORDER — DEXTROSE 50 % IV SOLN
INTRAVENOUS | Status: AC
  Filled 2019-03-02: qty 50

## 2019-03-02 MED ORDER — VANCOMYCIN HCL 10 G IV SOLR
1500.0000 mg | INTRAVENOUS | Status: DC
  Administered 2019-03-02: 1500 mg via INTRAVENOUS
  Filled 2019-03-02 (×2): qty 1500

## 2019-03-02 MED ORDER — TRAZODONE HCL 50 MG PO TABS
25.0000 mg | ORAL_TABLET | Freq: Every evening | ORAL | Status: DC | PRN
  Administered 2019-03-10: 50 mg via ORAL
  Filled 2019-03-02: qty 1

## 2019-03-02 MED ORDER — ORAL CARE MOUTH RINSE
15.0000 mL | Freq: Two times a day (BID) | OROMUCOSAL | Status: DC
  Administered 2019-03-03 – 2019-03-10 (×10): 15 mL via OROMUCOSAL

## 2019-03-02 MED ORDER — ACETAMINOPHEN 325 MG PO TABS
325.0000 mg | ORAL_TABLET | ORAL | Status: DC | PRN
  Administered 2019-03-02: 650 mg via ORAL
  Filled 2019-03-02: qty 2

## 2019-03-02 MED ORDER — PANTOPRAZOLE SODIUM 40 MG PO TBEC
40.0000 mg | DELAYED_RELEASE_TABLET | Freq: Every day | ORAL | Status: DC
  Administered 2019-03-03 – 2019-03-11 (×9): 40 mg via ORAL
  Filled 2019-03-02 (×9): qty 1

## 2019-03-02 MED ORDER — POTASSIUM CHLORIDE CRYS ER 20 MEQ PO TBCR: 40.0000 meq | EXTENDED_RELEASE_TABLET | Freq: Once | ORAL | Status: DC

## 2019-03-02 MED ORDER — DIPHENHYDRAMINE HCL 12.5 MG/5ML PO ELIX: 12.5000 mg | ORAL_SOLUTION | Freq: Four times a day (QID) | ORAL | Status: DC | PRN

## 2019-03-02 MED ORDER — HYDROCODONE-ACETAMINOPHEN 5-325 MG PO TABS: 1.0000 | ORAL_TABLET | Freq: Four times a day (QID) | ORAL | Status: DC | PRN

## 2019-03-02 MED ORDER — LORAZEPAM 2 MG/ML IJ SOLN: 1.0000 mg | Freq: Four times a day (QID) | INTRAMUSCULAR | Status: DC | PRN

## 2019-03-02 MED ORDER — PROCHLORPERAZINE MALEATE 5 MG PO TABS: 5.0000 mg | ORAL_TABLET | Freq: Four times a day (QID) | ORAL | Status: DC | PRN

## 2019-03-02 MED ORDER — PROCHLORPERAZINE 25 MG RE SUPP: 12.5000 mg | Freq: Four times a day (QID) | RECTAL | Status: DC | PRN

## 2019-03-02 MED ORDER — BISACODYL 10 MG RE SUPP: 10.0000 mg | Freq: Every day | RECTAL | Status: DC | PRN

## 2019-03-02 MED ORDER — FLEET ENEMA 7-19 GM/118ML RE ENEM: 1.0000 | ENEMA | Freq: Once | RECTAL | Status: DC | PRN

## 2019-03-02 NOTE — IPOC Note (Signed)
Individualized overall Plan of Care (IPOC) Patient Details Name: Preston Hill MRN: [id number] DOB: [birth year]  Admitting Diagnosis: Left SDH  Hospital Problems: Active Problems:   SDH (subdural hematoma) (HCC)   Hypoxia   Seizures (HCC)   Benign prostatic hyperplasia   Benign essential HTN   New onset atrial fibrillation (HCC)   Hypoalbuminemia due to protein-calorie malnutrition (HCC)   FUO (fever of unknown origin)   Acute blood loss anemia   Labile blood glucose     Functional Problem List: Nursing Safety, Medication Management, Endurance, Bladder  PT Balance, Endurance, Safety  OT Balance, Cognition, Endurance, Motor, Safety  SLP Cognition  TR         Basic ADL's: OT Eating, Grooming, Bathing, Dressing, Toileting     Advanced  ADL's: OT       Transfers: PT Furniture, Bed to Chair, Bed Mobility, Car, Floor  OT Toilet, Tub/Shower     Locomotion: PT Stairs, Ambulation     Additional Impairments: OT None  SLP Communication, Social Cognition expression Problem Solving, Memory  TR      Anticipated Outcomes Item Anticipated Outcome  Self Feeding Independent   Swallowing      Basic self-care  Supervision  Toileting  Supervision   Bathroom Transfers Supervision  Bowel/Bladder  manage bladder with mod I assist  Transfers  supervision  Locomotion  supervision household gait  Communication  Supervision A   Cognition  Mod-Min A  Pain     Safety/Judgment  maintain safety with cues/reminders   Therapy Plan: PT Intensity: Minimum of 1-2 x/day ,45 to 90 minutes PT Frequency: 5 out of 7 days PT Duration Estimated Length of Stay: 7-10 days OT Intensity: Minimum of 1-2 x/day, 45 to 90 minutes OT Frequency: 5 out of 7 days OT Duration/Estimated Length of Stay: 7-11 days SLP Intensity: Minumum of 1-2 x/day, 30 to 90 minutes SLP Frequency: 3 to 5 out of 7 days SLP Duration/Estimated Length of Stay: 7-10 days     Team Interventions: Nursing  Interventions Discharge Planning, Medication Management, Disease Management/Prevention, Patient/Family Education  PT interventions Cognitive remediation/compensation, Discharge planning, Ambulation/gait training, Community reintegration, Training and development officer, Disease management/prevention, DME/adaptive equipment instruction, Functional mobility training, Psychosocial support, Pain management, Splinting/orthotics, Therapeutic Activities, UE/LE Strength taining/ROM, UE/LE Coordination activities, Stair training, Therapeutic Exercise, Patient/family education, Neuromuscular re-education, Functional electrical stimulation, Skin care/wound management  OT Interventions Training and development officer, Cognitive remediation/compensation, Community reintegration, Discharge planning, DME/adaptive equipment instruction, Functional mobility training, Neuromuscular re-education, Patient/family education, Psychosocial support, Self Care/advanced ADL retraining, Therapeutic Activities, Therapeutic Exercise, UE/LE Strength taining/ROM, UE/LE Coordination activities, Wheelchair propulsion/positioning  SLP Interventions Cognitive remediation/compensation, Cueing hierarchy, Speech/Language facilitation, Functional tasks, Medication managment  TR Interventions    SW/CM Interventions Discharge Planning, Psychosocial Support, Patient/Family Education   Barriers to Discharge MD  Medical stability and Afib  Nursing      PT Medical stability, New oxygen not on O2 at baseline  OT      SLP      SW       Team Discharge Planning: Destination: PT-Home ,OT- Home , SLP-Home Projected Follow-up: PT-Home health PT, OT-  Home health OT, SLP-Home Health SLP, 24 hour supervision/assistance Projected Equipment Needs: PT-To be determined, OT- To be determined, SLP-None recommended by SLP Equipment Details: PT-has cane and rollator, OT-  Patient/family involved in discharge planning: PT- Family member/caregiver, Patient,   OT-Patient, SLP-Patient  MD ELOS: 5-8 days. Medical Rehab Prognosis:  Good Assessment: 85 year old male with history of OSA, HTN, BPH, chronic  tinnitus who was admitted on 02/21/19 with 2-3 day history of HA progressing to right sided weakness and difficulty walking. He was found to have acute on chronic large left SDH with left to right shift and effacement of frontal and parietal lobes. He was on low dose ASA and denies any falls or recent trauma. He was taken to OR the same day for burr hole evacuation with placement of subdural drain by Dr. Annette Stable. Post noted started on dilantin and Keppra due to abnormal lip smacking movement. He had recurrent seizures--generalized motor activity with foaming and decline in MS and ongoing LUE motor activity. Keppra increased to 1000 mg bid. Follow up CT head showed interval evacuation of L-SDH with decrease in mass effect. Lethargy has resolved and patient noted to have residual right visual field deficits with delayed processing and balance deficits. Follow up labs reveal persistent hypokalemia as well as ABLA. He continues to have tachycardia with HR up to 120's and new onset A fib. Cardiology consulted for input and he was started on IV cardizem--transitioned to low dose metoprolol today with recommendations for close monitoring due to hx of 1 degree AVB. Eliquis recommended when cleared by NS and to follow up with Dr. Lovena Le past discharge. Therapy ongoingand patient continues to have balance deficits with shuffling gait and hypoxia with activity--requires 4 L per [facility].Will set goals for Supervision with PT/OT and Min A for cognition with SLP.   Due to the current state of emergency, patients may not be receiving their 3-hours of Medicare-mandated therapy.  See Team Conference Notes for weekly updates to the plan of care

## 2019-03-02 NOTE — Progress Notes (Signed)
Report called to 4MW, patient discharged to CIR. In no distress. All questions answered. Lianne Bushy RN BSN

## 2019-03-02 NOTE — Progress Notes (Signed)
Patient resting comfortably on home CPAP unit. No assistance needed from RT at this time. ?

## 2019-03-02 NOTE — Progress Notes (Signed)
Physical Therapy Treatment Patient Details Name: Preston Hill MRN: [id number] DOB: 06-11-1934 Today's Date: 03/02/2019    History of Present Illness Patient is a 85 y/o male who presents with progressive RUE/LE weakness. Found to left sided SDH with mass effect s/p burr hole evacuation and placement of subdural drain 5/23. Postoperatively had seizures x3. PMH includes HTN, CKD, skin ca.     PT Comments    Patient progressing well towards PT goals. Continues to require Min A for transfers and balance. Sp02 decreases to low 80s on RA requiring 3L.min 02 to maintain SP02 >89%. HR ranges from 90-129 bpm A-fib. Pt tolerated gait training with close min guard for balance/safety. + coughing. Reports some back discomfort. Seems less confused today as pt A&Ox4 and joking appropriately. Still with deficits related to attention and problem solving. Would benefit from CIR. Will follow.    Follow Up Recommendations  CIR;Supervision for mobility/OOB;Supervision/Assistance - 24 hour     Equipment Recommendations  Rolling walker with 5" wheels    Recommendations for Other Services       Precautions / Restrictions Precautions Precautions: Fall Precaution Comments: watch HR, 02 Restrictions Weight Bearing Restrictions: No    Mobility  Bed Mobility Overal bed mobility: Needs Assistance Bed Mobility: Rolling;Sidelying to Sit Rolling: Min guard Sidelying to sit: Min guard;HOB elevated       General bed mobility comments: Increased effort and time to elevate trunk to get to EOB. No dizziness.   Transfers Overall transfer level: Needs assistance Equipment used: Rolling walker (2 wheeled) Transfers: Sit to/from Stand Sit to Stand: Min assist         General transfer comment: Min assist to power to stand, and for balance, and cues for hand placement; transferred to chair post ambulation.  Ambulation/Gait Ambulation/Gait assistance: Min guard Gait Distance (Feet): 200 Feet Assistive  device: Rolling walker (2 wheeled) Gait Pattern/deviations: Step-through pattern;Shuffle;Decreased stride length;Decreased dorsiflexion - right;Decreased dorsiflexion - left Gait velocity: .57 ft/sec Gait velocity interpretation: <1.31 ft/sec, indicative of household ambulator General Gait Details: Pt with slow, shuffling gait pattern. Cues for upright posture and walker proximity. 1 standing rest breaks. 2-3/4 DOE. Sp02 low 80s on RA, donned 3L/min 02 and SP02 remained >89%. HR 90-129 bpm A-fib.   Stairs             Wheelchair Mobility    Modified Rankin (Stroke Patients Only) Modified Rankin (Stroke Patients Only) Pre-Morbid Rankin Score: No significant disability Modified Rankin: Moderately severe disability     Balance Overall balance assessment: Needs assistance Sitting-balance support: Feet supported;No upper extremity supported Sitting balance-Leahy Scale: Good     Standing balance support: During functional activity;Bilateral upper extremity supported Standing balance-Leahy Scale: Poor Standing balance comment: Requires external support for standing balance.                            Cognition Arousal/Alertness: Awake/alert Behavior During Therapy: WFL for tasks assessed/performed Overall Cognitive Status: Impaired/Different from baseline Area of Impairment: Attention                   Current Attention Level: Selective   Following Commands: Follows one step commands consistently     Problem Solving: Requires verbal cues;Slow processing General Comments: A&Ox4 today. In good spirits. Joking. Impaired attention.      Exercises      General Comments        Pertinent Vitals/Pain Pain Assessment: Faces Faces Pain Scale: Hurts  a little bit Pain Location: back- chronic Pain Descriptors / Indicators: Aching;Sore Pain Intervention(s): Monitored during session;Repositioned    Home Living                      Prior Function             PT Goals (current goals can now be found in the care plan section) Progress towards PT goals: Progressing toward goals    Frequency    Min 4X/week      PT Plan Current plan remains appropriate    Co-evaluation              AM-PAC PT "6 Clicks" Mobility   Outcome Measure  Help needed turning from your back to your side while in a flat bed without using bedrails?: A Little Help needed moving from lying on your back to sitting on the side of a flat bed without using bedrails?: A Little Help needed moving to and from a bed to a chair (including a wheelchair)?: A Little Help needed standing up from a chair using your arms (e.g., wheelchair or bedside chair)?: A Little Help needed to walk in hospital room?: A Little Help needed climbing 3-5 steps with a railing? : A Lot 6 Click Score: 17    End of Session Equipment Utilized During Treatment: Gait belt;Oxygen Activity Tolerance: Patient tolerated treatment well Patient left: in chair;with call bell/phone within reach;with chair alarm set Nurse Communication: Mobility status PT Visit Diagnosis: Unsteadiness on feet (R26.81);Muscle weakness (generalized) (M62.81);Difficulty in walking, not elsewhere classified (R26.2)     Time: 9518-8416 PT Time Calculation (min) (ACUTE ONLY): 24 min  Charges:  $Gait Training: 8-22 mins $Therapeutic Activity: 8-22 mins                     Wray Kearns, [place], DPT Acute Rehabilitation Services Pager [phone] Office [place] 03/02/2019, 8:42 AM

## 2019-03-02 NOTE — Progress Notes (Signed)
Preston Hill, OT  Rehab Admission Coordinator  Physical Medicine and Rehabilitation  PMR Pre-admission  Signed  Date of Service:  02/26/2019 1:15 PM       Related encounter: Admission (Discharged) from 02/21/2019 in [facility] NEURO/TRAUMA/SURGICAL ICU      Signed         PMR Admission Coordinator Pre-Admission Assessment  Patient: Preston Hill is an 85 y.o., male MRN: [id number] DOB: 06/04/1934 Height: 5' 6"  (167.6 cm) Weight: 80.6 kg                                                                                                                                                  Insurance Information HMO: yes    PPO:      PCP:      IPA:      80/20:      OTHER:  PRIMARY: [employer]      Policy#: [id number]      Subscriber: Patient CM Name: Wyona Almas      Phone#: [phone] [id number]     Fax#: [phone] Pre-Cert#: [id number]      Employer:  Josem Kaufmann provided by [employer] on 5/29 for admit to CIR. Pt is able to admit til 6/1 under this auth. Weekly updates regarding DC are due to [facility] (p): [phone] (f): [phone].  Benefits:  Phone #: NA, online at [email or web address]     Name: [email or web address] Eff. Date: 10/01/18 still active     Deduct: $0      Out of Pocket Max: $3,000 (met $488.59)      Life Max: NA CIR: $295/day for days 1-6, $0/day for days 7+      SNF: $0/day for days 1-20, $178/day for days 21-100, limited to 100 days/cal yr Outpatient: limited by medical necessity     Co-Pay: $10-40/visit pending service Home Health: 100%, limited to medical necessity      Co-Pay: 0% DME: 80%     Co-Pay: 20% Providers:  SECONDARY: None       Policy#:       Subscriber:  CM Name:       Phone#:      Fax#:  Pre-Cert#      Employer:  Benefits:  Phone #:      Name:  Eff. Date:      Deduct:       Out of Pocket Max:       Life Max:  CIR:       SNF:  Outpatient:      Co-Pay:  Home Health:       Co-Pay:  DME:      Co-Pay:  Medicaid Application Date:       Case Manager:    Disability Application Date:       Case Worker:   The Data Collection Information Summary for patients in  Inpatient Rehabilitation Facilities with attached Privacy Act [place] Records was provided and verbally reviewed with: Family and pt  Emergency Contact Information         Contact Information    Name Relation Home Work Mobile   [place] J Spouse [phone]  [phone]   Wael, Maestas   [phone]     Current Medical History  Patient Admitting Diagnosis: Acute on chronic left subdural hematoma, with cognitive dysfunction and poor balance as well as right upper extremity weakness  History of Present Illness: TREVONN Hill is an 85 year old male with history of OSA, HTN, BPH, chronic tinnitus who was admitted on 02/21/19 with 2-3 day history of HA progressing to right sided weakness and difficulty walking. He was found to have acute on chronic large left SDH with left to right shift and effacement of frontal and parietal lobes. He was on low dose ASA and denies any falls or recent trauma. He was taken to OR the same day for burr hole evacuation with placement of subdural drain by Dr. Annette Stable. Post noted started on dilantin and Keppra due to abnormal lip smacking movement. He had recurrent seizures--generalized motor activity with foaming and decline in MS and ongoing LUE motor activity. Keppra increased to 1000 mg bid. Follow up CT head showed interval evacuation of L-SDH with decrease in mass effect. Lethargy has resolved and patient noted to have residual right visual field deficits with delayed processing and balance deficits. Follow up labs reveal persistent hypokalemia as well as ABLA. He continues to have tachycardia with HR up to 120's and new onset A fib. Cardiology consulted for input and he was started on IV cardizem--transitioned to low dose metoprolol today with recommendations for close monitoring due to hx of 1 degree AVB. Eliquis  recommended when cleared by NS and to follow up with Dr. Lovena Le past discharge. Therapy ongoingand patient continues to have balance deficits with shuffling gait and hypoxia with activity--requires 3 L per [place].CIR recommended due to functional deficits.Pt is to be admitted to CIR on 03/02/19.  Complete NIHSS TOTAL: 0 Glasgow Coma Scale Score: 15  Past Medical History      Past Medical History:  Diagnosis Date   Arthritis    Cancer ([place])    Skin Cancer   Chronic kidney disease    Enlarged prostate    Hypertension    Occasional tremors    Peripheral edema    Sleep apnea    OSA---USE C-PAP   Subdural hematoma ([place]) 02/21/2019   Tinnitus of both ears     Family History  family history includes Alzheimer's disease in his mother; Hypertension in his mother.  Prior Rehab/Hospitalizations:  Has the patient had prior rehab or hospitalizations prior to admission? No  Has the patient had major surgery during 100 days prior to admission? Yes  Current Medications   Current Facility-Administered Medications:    0.9 %  sodium chloride infusion, , Intravenous, Continuous, Pool, Mallie Mussel, MD, Last Rate: 75 mL/hr at 02/25/19 1235   0.9 %  sodium chloride infusion, , Intravenous, PRN, Earnie Larsson, MD   acetaminophen (TYLENOL) tablet 650 mg, 650 mg, Oral, Q4H PRN, 650 mg at 02/26/19 1608 **OR** acetaminophen (TYLENOL) suppository 650 mg, 650 mg, Rectal, Q4H PRN, Earnie Larsson, MD   Chlorhexidine Gluconate Cloth 2 % PADS 6 each, 6 each, Topical, Daily, Earnie Larsson, MD, 6 each at 03/02/19 1039   HYDROcodone-acetaminophen (NORCO/VICODIN) 5-325 MG per tablet 1 tablet, 1 tablet, Oral, Q4H PRN, Pool,  Mallie Mussel, MD, 1 tablet at 03/01/19 2020   HYDROmorphone (DILAUDID) injection 0.5-1 mg, 0.5-1 mg, Intravenous, Q2H PRN, Earnie Larsson, MD, 0.5 mg at 02/23/19 0445   labetalol (NORMODYNE) injection 10-40 mg, 10-40 mg, Intravenous, Q10 min PRN, Earnie Larsson, MD, 10 mg at 02/28/19  0105   levETIRAcetam (KEPPRA) tablet 1,000 mg, 1,000 mg, Oral, BID, Earnie Larsson, MD, 1,000 mg at 03/02/19 0263   LORazepam (ATIVAN) injection 1 mg, 1 mg, Intravenous, Q6H PRN, Clydell Hakim, MD   MEDLINE mouth rinse, 15 mL, Mouth Rinse, BID, Earnie Larsson, MD, 15 mL at 03/01/19 2021   metoprolol tartrate (LOPRESSOR) tablet 12.5 mg, 12.5 mg, Oral, BID, Hilty, Nadean Corwin, MD, 12.5 mg at 03/02/19 1037   ondansetron (ZOFRAN) tablet 4 mg, 4 mg, Oral, Q4H PRN **OR** ondansetron (ZOFRAN) injection 4 mg, 4 mg, Intravenous, Q4H PRN, Pool, Mallie Mussel, MD   pantoprazole (PROTONIX) EC tablet 40 mg, 40 mg, Oral, Daily, Norva Riffle, RPH, 40 mg at 03/02/19 7858   phenytoin (DILANTIN) ER capsule 100 mg, 100 mg, Oral, TID, Earnie Larsson, MD, 100 mg at 03/02/19 8502   promethazine (PHENERGAN) tablet 12.5-25 mg, 12.5-25 mg, Oral, Q4H PRN, Earnie Larsson, MD  Patients Current Diet:     Diet Order                  Diet regular Room service appropriate? Yes with Assist; Fluid consistency: Thin  Diet effective now               Precautions / Restrictions Precautions Precautions: Fall Precaution Comments: watch HR, 02 Restrictions Weight Bearing Restrictions: No   Has the patient had 2 or more falls or a fall with injury in the past year?No  Prior Activity Level Community (5-7x/wk): retired Clinical biochemist; drove PTA. active  Prior Functional Level Prior Function Level of Independence: Independent Comments: Drives, independent PTA per chart. Difficult historian secondary to aphasia.  Self Care: Did the patient need help bathing, dressing, using the toilet or eating?  Independent  Indoor Mobility: Did the patient need assistance with walking from room to room (with or without device)? Independent  Stairs: Did the patient need assistance with internal or external stairs (with or without device)? Independent  Functional Cognition: Did the patient need help planning regular tasks such  as shopping or remembering to take medications? Independent  Home Assistive Devices / Equipment Home Assistive Devices/Equipment: CPAP, Eyeglasses Home Equipment: None  Prior Device Use: Indicate devices/aids used by the patient prior to current illness, exacerbation or injury? None of the above  Current Functional Level Cognition  Arousal/Alertness: Awake/alert Overall Cognitive Status: Impaired/Different from baseline Difficult to assess due to: Impaired communication Current Attention Level: Selective Orientation Level: Oriented X4 Following Commands: Follows one step commands consistently Safety/Judgement: Decreased awareness of safety General Comments: A&Ox4 today. In good spirits. Joking. Impaired attention. Attention: Sustained Sustained Attention: Appears intact Memory: Impaired Memory Impairment: Storage deficit, Retrieval deficit Awareness: Impaired Awareness Impairment: Intellectual impairment Problem Solving: Impaired Problem Solving Impairment: Functional basic, Verbal basic Executive Function: Self Monitoring, Self Correcting Safety/Judgment: Impaired    Extremity Assessment (includes Sensation/Coordination)  Upper Extremity Assessment: Generalized weakness RUE Coordination: decreased fine motor, decreased gross motor LUE Coordination: decreased fine motor, decreased gross motor  Lower Extremity Assessment: Generalized weakness    ADLs  Overall ADL's : Needs assistance/impaired Eating/Feeding: Set up, Sitting Grooming: Brushing hair, Minimal assistance, Standing Grooming Details (indicate cue type and reason): supported sitting Upper Body Bathing: Supervision/ safety, Sitting Upper Body Bathing Details (indicate  cue type and reason): supported sitting Lower Body Bathing: Moderate assistance, Sit to/from stand Lower Body Bathing Details (indicate cue type and reason): +2 min A sit<>stand Upper Body Dressing : Minimal assistance, Sitting Upper Body  Dressing Details (indicate cue type and reason): supported sitting Lower Body Dressing: Maximal assistance, Sit to/from stand Lower Body Dressing Details (indicate cue type and reason): unable to access feet  Toilet Transfer: Minimal assistance, Ambulation, Comfort height toilet, BSC, Grab bars, RW Toilet Transfer Details (indicate cue type and reason): Bil HHA Toileting- Clothing Manipulation and Hygiene: Moderate assistance, Sit to/from stand Toileting - Clothing Manipulation Details (indicate cue type and reason): +2 min A sit<>stand Functional mobility during ADLs: Minimal assistance, Rolling walker General ADL Comments: requires assist for balance and cognition     Mobility  Overal bed mobility: Needs Assistance Bed Mobility: Rolling, Sidelying to Sit Rolling: Min guard Sidelying to sit: Min guard, HOB elevated General bed mobility comments: Increased effort and time to elevate trunk to get to EOB. No dizziness.     Transfers  Overall transfer level: Needs assistance Equipment used: Rolling walker (2 wheeled) Transfers: Sit to/from Stand Sit to Stand: Min assist General transfer comment: Min assist to power to stand, and for balance, and cues for hand placement; transferred to chair post ambulation.    Ambulation / Gait / Stairs / Wheelchair Mobility  Ambulation/Gait Ambulation/Gait assistance: Counsellor (Feet): 200 Feet Assistive device: Rolling walker (2 wheeled) Gait Pattern/deviations: Step-through pattern, Shuffle, Decreased stride length, Decreased dorsiflexion - right, Decreased dorsiflexion - left General Gait Details: Pt with slow, shuffling gait pattern. Cues for upright posture and walker proximity. 1 standing rest breaks. 2-3/4 DOE. Sp02 low 80s on RA, donned 3L/min 02 and SP02 remained >89%. HR 90-129 bpm A-fib. Gait velocity: .57 ft/sec Gait velocity interpretation: <1.31 ft/sec, indicative of household ambulator    Posture / Balance  Balance Overall balance assessment: Needs assistance Sitting-balance support: Feet supported, No upper extremity supported Sitting balance-Leahy Scale: Good Standing balance support: During functional activity, Bilateral upper extremity supported Standing balance-Leahy Scale: Poor Standing balance comment: Requires external support for standing balance.    Special needs/care consideration BiPAP/CPAP: yes, uses CPAP at home (has home device here in acute) CPM: no Continuous Drip IV: no Dialysis: no        Days: no Life Vest: no Oxygen: no Special Bed: seizure precautions Trach Size: no Wound Vac (area): no      Location: no Skin: surgical incision to head                       Bowel mgmt: last BM: 03/01/19 continent Bladder mgmt:external catheter  Diabetic mgmt: no Behavioral consideration : no Chemo/radiation : no     Previous Home Environment (from acute therapy documentation) Living Arrangements: Spouse/significant other  Lives With: Spouse Available Help at Discharge: Family, Available 24 hours/day Type of Home: House Home Layout: Two level Alternate Level Stairs-Rails: Right Alternate Level Stairs-Number of Steps: 1 flight Home Access: Stairs to enter Entrance Stairs-Rails: Right Entrance Stairs-Number of Steps: 4 Bathroom Toilet: Standard  Discharge Living Setting Plans for Discharge Living Setting: Patient's home, Lives with (comment)(wife) Type of Home at Discharge: House Discharge Home Layout: One level Discharge Home Access: Stairs to enter Entrance Stairs-Rails: None Entrance Stairs-Number of Steps: 3 Discharge Bathroom Shower/Tub: Tub/shower unit Discharge Bathroom Toilet: Standard Discharge Bathroom Accessibility: Yes How Accessible: Accessible via walker Does the patient have any problems obtaining your medications?: No  Social/Family/Support Systems Patient Roles: Spouse Contact Information: wife: Wilburn Cornelia: (home [phone]) [phone]);  Edd Arbour (son: [phone]) Anticipated Caregiver: wife Anticipated Caregiver's Contact Information: see above Ability/Limitations of Caregiver: Supervision Caregiver Availability: 24/7 Discharge Plan Discussed with Primary Caregiver: Yes Is Caregiver In Agreement with Plan?: Yes Does Caregiver/Family have Issues with Lodging/Transportation while Pt is in Rehab?: No   Goals/Additional Needs Patient/Family Goal for Rehab: PT/OT/ST: Supervision Expected length of stay: 7-10 days Cultural Considerations: Christian Dietary Needs: regular diet, thin liquids Equipment Needs: TBD Pt/Family Agrees to Admission and willing to participate: Yes Program Orientation Provided & Reviewed with Pt/Caregiver Including Roles  & Responsibilities: Yes(pt and then wife and son Corene Cornea via phone)  Barriers to Discharge: Home environment access/layout  Barriers to Discharge Comments: steps to enter; tub shower.    Decrease burden of Care through IP rehab admission: NA   Possible need for SNF placement upon discharge: Not anticipated; pt has good family support (confirmed through wife and son) and good prognosis for further progress through CIR level intensity program.    Patient Condition: This patient's medical and functional status has changed since the consult dated: 02/25/19 in which the Rehabilitation Physician determined and documented that the patient's condition is appropriate for intensive rehabilitative care in an inpatient rehabilitation facility. See "History of Present Illness" (above) for medical update. Functional changes are: improvement in ambulation from Min A 60 feet with RW to Min G 200 ft with RW, improvement in ADLs from Mod/Max A to Min/Max A; still requiring assistance for ADLs due to balance and cognition. Patient's medical and functional status update has been discussed with the Rehabilitation physician and patient remains appropriate for inpatient rehabilitation. Will admit to  inpatient rehab today.  Preadmission Screen Completed By:  Preston Hill, OT, 03/02/2019 2:51 PM ______________________________________________________________________   Discussed status with Dr. Naaman Plummer on 6/1 at 2:44PM and received approval for admission today.  Admission Coordinator:  Preston Hill, time 2:44PM/Date: 03/02/19         Cosigned by: Meredith Staggers, MD at 03/02/2019 3:15 PM  Revision History    Date/Time User Provider Type Action  03/02/2019 3:15 PM Meredith Staggers, MD Physician Cosign  03/02/2019 2:52 PM Preston Hill, [place] [facility] Admission Coordinator Sign

## 2019-03-02 NOTE — Progress Notes (Signed)
Stable. Transfer to rehab today

## 2019-03-02 NOTE — Evaluation (Signed)
Occupational Therapy Assessment and Plan  Patient Details  Name: Preston Hill MRN: [id number] Date of Birth: [birth year]  OT Diagnosis: cognitive deficits and muscle weakness (generalized) Rehab Potential: Rehab Potential (ACUTE ONLY): Excellent ELOS: 7-11 days   Today's Date: 03/03/2019 OT Individual Time: 3546-5681 OT Individual Time Calculation (min): 55 min     Problem List:  Patient Active Problem List   Diagnosis Date Noted  . Hypoxia   . Seizures ([place])   . Benign prostatic hyperplasia   . Benign essential HTN   . New onset atrial fibrillation ([place])   . Hypoalbuminemia due to protein-calorie malnutrition ([place])   . FUO (fever of unknown origin)   . Acute blood loss anemia   . Labile blood glucose   . SDH (subdural hematoma) ([place]) 03/02/2019  . Hypokalemia   . Hyperglycemia   . Atrial fibrillation with rapid ventricular response ([place])   . Subdural hematoma ([place]) 02/21/2019  . Personal history of other malignant neoplasm of skin 05/20/2018  . Bilateral leg edema 05/14/2018  . Injury of tendon of long head of left biceps 03/31/2018  . Tremor 11/06/2017  . First degree AV block 07/09/2017  . Loose body of right knee 07/03/2017  . Primary osteoarthritis of right knee 07/03/2017  . Bradycardia 06/20/2017  . Primary osteoarthritis of left knee 03/22/2017  . Encounter for screening colonoscopy 12/20/2015  . OSA (obstructive sleep apnea) 09/23/2014  . Abnormal ECG 02/26/2014  . Essential hypertension 02/26/2014  . Hyperlipidemia, mixed 02/26/2014    Past Medical History:  Past Medical History:  Diagnosis Date  . Arthritis   . Chronic kidney disease   . Enlarged prostate   . Hypertension   . Occasional tremors   . Peripheral edema   . Sleep apnea    OSA---USE C-PAP  . Subdural hematoma ([place]) 02/21/2019  . Tinnitus of both ears    Past Surgical History:  Past Surgical History:  Procedure Laterality Date  . BURR HOLE Left 02/21/2019   Procedure: BURR HOLES,  Placement of Subdural drain;  Surgeon: Earnie Larsson, MD;  Location: [facility];  Service: Neurosurgery;  Laterality: Left;  . COLONOSCOPY  3817/2006  . COLONOSCOPY WITH PROPOFOL N/A 02/01/2016   Procedure: COLONOSCOPY WITH PROPOFOL;  Surgeon: Robert Bellow, MD;  Location: [facility] ENDOSCOPY;  Service: Endoscopy;  Laterality: N/A;  . EYE SURGERY Right    Cataract Extraction with IOL  . HERNIA REPAIR Left 1964   Inguinal Hernia Repair  . KNEE ARTHROSCOPY Left   . KNEE ARTHROSCOPY Right 06/26/2017   Procedure: ARTHROSCOPY KNEE WITH DEBRIDEMENT AND LOOSE BODY REMOVAL;  Surgeon: Corky Mull, MD;  Location: [facility];  Service: Orthopedics;  Laterality: Right;  . KNEE ARTHROSCOPY WITH MEDIAL MENISECTOMY Left 05/02/2017   Procedure: KNEE ARTHROSCOPY WITH MEDIAL MENISECTOMY;  Surgeon: Corky Mull, MD;  Location: [facility] ORS;  Service: Orthopedics;  Laterality: Left;  . KNEE ARTHROSCOPY WITH MENISCAL REPAIR  05/02/2017   Procedure: KNEE ARTHROSCOPY WITH MENISCAL REPAIR;  Surgeon: Corky Mull, MD;  Location: [facility] ORS;  Service: Orthopedics;;  . SEPTOPLASTY  2003  . uvul  2004   repair    Assessment & Plan Clinical Impression: Patient is a 85 y.o. year old male who presents with progressive RUE/LE weakness. Found to left sided SDH with mass effect s/p burr hole evacuation and placement of subdural drain 5/23. Postoperatively had seizures x3. PMH includes HTN, CKD, skin ca.   .  Patient transferred to CIR on 03/02/2019 .  Patient currently requires mod with basic self-care skills secondary to muscle weakness, decreased cardiorespiratoy endurance and decreased oxygen support, decreased problem solving, decreased safety awareness and decreased memory and decreased sitting balance, decreased standing balance, decreased postural control and decreased balance strategies.  Prior to hospitalization, patient could complete BADL with independent .  Patient will benefit from skilled intervention to increase  independence with basic self-care skills prior to discharge home with care partner.  Anticipate patient will require 24 hour supervision and follow up home health.  OT - End of Session Endurance Deficit: Yes Endurance Deficit Description: needed multiple rest breaks within BADL tasks OT Assessment Rehab Potential (ACUTE ONLY): Excellent OT Patient demonstrates impairments in the following area(s): Balance;Cognition;Endurance;Motor;Safety OT Basic ADL's Functional Problem(s): Eating;Grooming;Bathing;Dressing;Toileting OT Transfers Functional Problem(s): Toilet;Tub/Shower OT Additional Impairment(s): None OT Plan OT Intensity: Minimum of 1-2 x/day, 45 to 90 minutes OT Frequency: 5 out of 7 days OT Duration/Estimated Length of Stay: 7-11 days OT Treatment/Interventions: Balance/vestibular training;Cognitive remediation/compensation;Community reintegration;Discharge planning;DME/adaptive equipment instruction;Functional mobility training;Neuromuscular re-education;Patient/family education;Psychosocial support;Self Care/advanced ADL retraining;Therapeutic Activities;Therapeutic Exercise;UE/LE Strength taining/ROM;UE/LE Coordination activities;Wheelchair propulsion/positioning OT Self Feeding Anticipated Outcome(s): Independent  OT Basic Self-Care Anticipated Outcome(s): Supervision OT Toileting Anticipated Outcome(s): Supervision OT Bathroom Transfers Anticipated Outcome(s): Supervision OT Recommendation Patient destination: Home Follow Up Recommendations: Home health OT Equipment Recommended: To be determined   Skilled Therapeutic Intervention Initial eval completed with treatment provided to address functional transfers,improved sit<>stand, standing tolerance, and adapted bathing/dressing skills. Pt greeted sitting in recliner finishing lunch and agreeable to OT treatment session. Pt declined bathing at shower level, but agreeable to washing up at the sink. Pt on 2L of O2 throughout session.  Noticed IV was leaking at his hand so nursing informed, and they removed IV. Pt ambulated to the sink with RW and CGA. UB bathing completed sitting in wc with set-up A. Sit<>stand with min/CGA and CGA for balance when washing buttocks and peri-area. Pt with difficulty leaning forward requiring OT assistance to wash lower legs, don socks, and thread pant legs. Pt needed extended rest breaks after standing tasks 2/2 decreased endurance. Pt tolerated standing for 2 minutes at the sink to brush his teeth, then ambulated back to recliner with min HHA. Pt left seated in recliner with call bell in reach, chair alarm on, and needs met.   OT Evaluation Precautions/Restrictions  Precautions Precautions: Fall Precaution Comments: watch HR, 02 Restrictions Weight Bearing Restrictions: No Pain  denies pain Home Living/Prior Functioning Home Living Available Help at Discharge: Family, Available 24 hours/day Type of Home: House Home Access: Stairs to enter [employer] of Steps: 4 Entrance Stairs-Rails: Right Home Layout: One level Alternate Level Stairs-Number of Steps: 1 flight Alternate Level Stairs-Rails: Right Bathroom Shower/Tub: Government social research officer Accessibility: Yes  Lives With: Spouse IADL History Current License: Yes IADL Comments: Pt reports him and wife both do grocery shopping Prior Function Level of Independence: Independent with basic ADLs, Independent with homemaking with ambulation  Able to Take Stairs?: Yes Driving: Yes Vocation: Retired Comments: Drives, independent PTA, enjoys gardening ADL ADL Eating: Set up Grooming: Supervision/safety Upper Body Bathing: Setup Lower Body Bathing: Moderate assistance Upper Body Dressing: Minimal assistance Lower Body Dressing: Maximal assistance Toileting: Minimal assistance Toilet Transfer: Minimal assistance Toilet Transfer Method: Ambulating Tub/Shower Transfer: Unable to  assess Vision Baseline Vision/History: Wears glasses Wears Glasses: At all times Patient Visual Report: No change from baseline Perception  Perception: Within Functional Limits Praxis Praxis: Intact Cognition Overall Cognitive Status: Impaired/Different from baseline Arousal/Alertness:  Awake/alert Orientation Level: Person Year: 2020 Month: June Day of Week: Incorrect Memory: Impaired Immediate Memory Recall: Sock;Blue;Bed Memory Recall: Sock;Blue Memory Recall Sock: Without Cue Memory Recall Blue: Without Cue Attention: Sustained Sustained Attention: Appears intact Awareness: Impaired Awareness Impairment: Intellectual impairment Problem Solving: Impaired Problem Solving Impairment: Functional basic;Verbal basic Executive Function: Self Monitoring;Self Correcting Safety/Judgment: Impaired Sensation Sensation Light Touch: Appears Intact Hot/Cold: Appears Intact Coordination Gross Motor Movements are Fluid and Coordinated: Yes Fine Motor Movements are Fluid and Coordinated: Yes Coordination and Movement Description: WNL Finger Nose Finger Test: WNL Motor  Motor Motor - Skilled Clinical Observations: generalized weakness Mobility  Transfers Sit to Stand: Contact Guard/Touching assist Stand to Sit: Contact Guard/Touching assist  Trunk/Postural Assessment  Cervical Assessment Cervical Assessment: Within Functional Limits Thoracic Assessment Thoracic Assessment: Exceptions to WFL(rounded shoulders) Lumbar Assessment Lumbar Assessment: Exceptions to WFL(posterior pelvic tilt) Postural Control Postural Control: Within Functional Limits  Balance Balance Balance Assessed: Yes Static Sitting Balance Static Sitting - Balance Support: Feet supported;No upper extremity supported Static Sitting - Level of Assistance: 5: Stand by assistance Dynamic Sitting Balance Dynamic Sitting - Balance Support: No upper extremity supported;Feet supported Dynamic Sitting - Level of  Assistance: 5: Stand by assistance Static Standing Balance Static Standing - Balance Support: During functional activity Static Standing - Level of Assistance: 5: Stand by assistance Dynamic Standing Balance Dynamic Standing - Balance Support: During functional activity;No upper extremity supported Dynamic Standing - Level of Assistance: 4: Min assist Extremity/Trunk Assessment RUE Assessment RUE Assessment: Within Functional Limits General Strength Comments: 4/5 grossly LUE Assessment LUE Assessment: Within Functional Limits General Strength Comments: 4/5 grossly     Refer to Care Plan for Long Term Goals  Recommendations for other services: None    Discharge Criteria: Patient will be discharged from OT if patient refuses treatment 3 consecutive times without medical reason, if treatment goals not met, if there is a change in medical status, if patient makes no progress towards goals or if patient is discharged from hospital.  The above assessment, treatment plan, treatment alternatives and goals were discussed and mutually agreed upon: by patient  Valma Cava 03/03/2019, 1:52 PM

## 2019-03-02 NOTE — Progress Notes (Signed)
Pharmacy Antibiotic Note  Preston Hill is a 85 y.o. male admitted on 03/02/2019 with UTI.  Pharmacy has been consulted for vancomycin dosing.  Plan: Vancomycin 1500 mg IV q24 Monitor for de-escalation, clinical course and vanc levels as needed.  Expected AUC 447, Scr 0.55    Temp (24hrs), Avg:98.9 F (37.2 C), Min:97.9 F (36.6 C), Max:101.4 F (38.6 C)  Recent Labs  Lab 02/24/19 0910 02/25/19 0427 02/26/19 0940 03/02/19 0540  CREATININE 0.89 0.79 0.77 0.55*    Estimated Creatinine Clearance: 71 mL/min (A) (by C-G formula based on SCr of 0.55 mg/dL (L)).    Allergies  Allergen Reactions  . Codeine Anxiety    Sick     Sahib Pella A. Levada Dy, PharmD, [place] Please utilize Amion for appropriate phone number to reach the unit pharmacist ([place])   03/02/2019 6:42 PM

## 2019-03-02 NOTE — H&P (Signed)
Physical Medicine and Rehabilitation Admission H&P     CC: SDH     HPI: Preston Hill is an 85 year old male with history of OSA, HTN, BPH, chronic tinnitus who was admitted on 02/21/19 with 2-3 day history of HA progressing to right sided weakness and difficulty walking.  He was found to have acute on chronic large left SDH with left to right shift and effacement of frontal and parietal lobes. He was on low dose ASA and denies any falls or recent trauma. He was taken to OR the same day for burr hole evacuation with placement of subdural drain by Dr. Annette Stable. Post noted started on dilantin and Keppra due to abnormal lip smacking movement. He had recurrent seizures--generalized motor activity with foaming and decline in MS and ongoing LUE motor activity. Keppra increased to 1000 mg bid.  Follow up CT head showed interval evacuation of L-SDH with decrease in mass effect.   Lethargy has resolved and patient noted to have residual right visual field deficits with delayed processing and balance deficits. Follow up labs reveal persistent hypokalemia as well as ABLA. He continues to have tachycardia with HR up to 120's and new onset A fib.  Cardiology consulted for input and he was started on IV cardizem--transitioned to low dose metoprolol today with recommendations for close monitoring  due to hx of 1 degree AVB.  Eliquis recommended when cleared by NS and to follow up with Dr. Lovena Le past discharge.  Therapy ongoing and patient continues to have balance deficits with shuffling gait and hypoxia with activity--requires 4 L per [place]. CIR recommended due to functional deficits.      Review of Systems  Constitutional: Negative for chills and fever.  HENT: Positive for hearing loss and tinnitus (chronic).   Eyes: Negative for blurred vision.  Respiratory: Negative for cough and shortness of breath.   Cardiovascular: Negative for chest pain and palpitations.  Gastrointestinal: Negative for heartburn and  nausea.  Genitourinary: Negative for dysuria.  Skin: Negative for rash.  Neurological: Negative for dizziness and headaches.  Psychiatric/Behavioral: Negative for memory loss.            Past Medical History:  Diagnosis Date  . Arthritis    . Cancer ([place])      Skin Cancer  . Chronic kidney disease    . Enlarged prostate    . Hypertension    . Occasional tremors    . Peripheral edema    . Sleep apnea      OSA---USE C-PAP  . Subdural hematoma ([place]) 02/21/2019  . Tinnitus of both ears             Past Surgical History:  Procedure Laterality Date  . BURR HOLE Left 02/21/2019    Procedure: BURR HOLES, Placement of Subdural drain;  Surgeon: Earnie Larsson, MD;  Location: [facility];  Service: Neurosurgery;  Laterality: Left;  . COLONOSCOPY   3817/2006  . COLONOSCOPY WITH PROPOFOL N/A 02/01/2016    Procedure: COLONOSCOPY WITH PROPOFOL;  Surgeon: Robert Bellow, MD;  [facility];  Service: Endoscopy;  Laterality: N/A;  . EYE SURGERY Right      Cataract Extraction with IOL  . HERNIA REPAIR Left 1964    Inguinal Hernia Repair  . KNEE ARTHROSCOPY Left    . KNEE ARTHROSCOPY Right 06/26/2017    Procedure: ARTHROSCOPY KNEE WITH DEBRIDEMENT AND LOOSE BODY REMOVAL;  Surgeon: Corky Mull, MD;  Location: [facility];  Service:  Orthopedics;  Laterality: Right;  . KNEE ARTHROSCOPY WITH MEDIAL MENISECTOMY Left 05/02/2017    Procedure: KNEE ARTHROSCOPY WITH MEDIAL MENISECTOMY;  Surgeon: Corky Mull, MD;  Location: [facility] ORS;  Service: Orthopedics;  Laterality: Left;  . KNEE ARTHROSCOPY WITH MENISCAL REPAIR   05/02/2017    Procedure: KNEE ARTHROSCOPY WITH MENISCAL REPAIR;  Surgeon: Corky Mull, MD;  Location: [facility] ORS;  Service: Orthopedics;;  . SEPTOPLASTY   2003  . uvul   2004    repair           Family History  Problem Relation Age of Onset  . Hypertension Mother    . Alzheimer's disease Mother        Social History:  Married. Independent without AD PTA. He reports  that he has never smoked. He has never used smokeless tobacco. He reports that he does not drink alcohol or use drugs.           Allergies  Allergen Reactions  . Codeine Anxiety      Sick              Medications Prior to Admission  Medication Sig Dispense Refill  . acetaminophen (TYLENOL) 500 MG tablet Take 1,000 mg by mouth every 6 (six) hours as needed (for pain.).      Marland Kitchen amLODipine (NORVASC) 5 MG tablet Take 10 mg by mouth daily.       Marland Kitchen aspirin EC 81 MG tablet Take 81 mg by mouth daily.      . Glucosamine Sulfate 1000 MG CAPS Take 2,000 mg by mouth daily.       Marland Kitchen ibuprofen (ADVIL,MOTRIN) 200 MG tablet Take 400 mg by mouth every 8 (eight) hours as needed (for pain.).      Marland Kitchen lisinopril (PRINIVIL,ZESTRIL) 20 MG tablet Take 20 mg by mouth daily.      . potassium chloride SA (K-DUR,KLOR-CON) 20 MEQ tablet Take 20 mEq by mouth 2 (two) times daily.      . propranolol (INDERAL) 40 MG tablet Take 40 mg by mouth 2 (two) times daily.      . tamsulosin (FLOMAX) 0.4 MG CAPS capsule Take 0.8 mg by mouth at bedtime.       . vitamin E (VITAMIN E) 400 UNIT capsule Take 400 Units by mouth daily.          Drug Regimen Review  Drug regimen was reviewed and remains appropriate with no significant issues identified   Home: Home Living Family/patient expects to be discharged to:: Inpatient rehab Living Arrangements: Spouse/significant other Available Help at Discharge: Family, Available 24 hours/day Type of Home: House Home Access: Stairs to enter [employer] of Steps: 4 Entrance Stairs-Rails: Right Home Layout: Two level Alternate Level Stairs-Number of Steps: 1 flight Alternate Level Stairs-Rails: Right Bathroom Toilet: Standard Home Equipment: None  Lives With: Spouse   Functional History: Prior Function Level of Independence: Independent Comments: Drives, independent PTA per chart. Difficult historian secondary to aphasia.   Functional Status:  Mobility: Bed Mobility  Overal bed mobility: Needs Assistance Bed Mobility: Rolling, Sidelying to Sit Rolling: Min guard Sidelying to sit: Min guard, HOB elevated General bed mobility comments: Increased effort and time to elevate trunk to get to EOB. No dizziness.  Transfers Overall transfer level: Needs assistance Equipment used: Rolling walker (2 wheeled) Transfers: Sit to/from Stand Sit to Stand: Min assist General transfer comment: Min assist to power to stand, and for balance, and cues for hand placement; transferred to chair post ambulation.  Ambulation/Gait Ambulation/Gait assistance: Min guard Gait Distance (Feet): 200 Feet Assistive device: Rolling walker (2 wheeled) Gait Pattern/deviations: Step-through pattern, Shuffle, Decreased stride length, Decreased dorsiflexion - right, Decreased dorsiflexion - left General Gait Details: Pt with slow, shuffling gait pattern. Cues for upright posture and walker proximity. 1 standing rest breaks. 2-3/4 DOE. Sp02 low 80s on RA, donned 3L/min 02 and SP02 remained >89%. HR 90-129 bpm A-fib. Gait velocity: .57 ft/sec Gait velocity interpretation: <1.31 ft/sec, indicative of household ambulator   ADL: ADL Overall ADL's : Needs assistance/impaired Eating/Feeding: Set up, Sitting Grooming: Brushing hair, Minimal assistance, Standing Grooming Details (indicate cue type and reason): supported sitting Upper Body Bathing: Supervision/ safety, Sitting Upper Body Bathing Details (indicate cue type and reason): supported sitting Lower Body Bathing: Moderate assistance, Sit to/from stand Lower Body Bathing Details (indicate cue type and reason): +2 min A sit<>stand Upper Body Dressing : Minimal assistance, Sitting Upper Body Dressing Details (indicate cue type and reason): supported sitting Lower Body Dressing: Maximal assistance, Sit to/from stand Lower Body Dressing Details (indicate cue type and reason): unable to access feet  Toilet Transfer: Minimal assistance,  Ambulation, Comfort height toilet, BSC, Grab bars, RW Toilet Transfer Details (indicate cue type and reason): Bil HHA Toileting- Clothing Manipulation and Hygiene: Moderate assistance, Sit to/from stand Toileting - Clothing Manipulation Details (indicate cue type and reason): +2 min A sit<>stand Functional mobility during ADLs: Minimal assistance, Rolling walker General ADL Comments: requires assist for balance and cognition    Cognition: Cognition Overall Cognitive Status: Impaired/Different from baseline Arousal/Alertness: Awake/alert Orientation Level: Oriented X4 Attention: Sustained Sustained Attention: Appears intact Memory: Impaired Memory Impairment: Storage deficit, Retrieval deficit Awareness: Impaired Awareness Impairment: Intellectual impairment Problem Solving: Impaired Problem Solving Impairment: Functional basic, Verbal basic Executive Function: Self Monitoring, Self Correcting Safety/Judgment: Impaired Cognition Arousal/Alertness: Awake/alert Behavior During Therapy: WFL for tasks assessed/performed Overall Cognitive Status: Impaired/Different from baseline Area of Impairment: Attention Orientation Level: Disoriented to, Place, Time Current Attention Level: Selective Memory: Decreased short-term memory Following Commands: Follows one step commands consistently Safety/Judgement: Decreased awareness of safety Problem Solving: Requires verbal cues, Slow processing General Comments: A&Ox4 today. In good spirits. Joking. Impaired attention. Difficult to assess due to: Impaired communication     Blood pressure 125/73, pulse 85, temperature 97.9 F (36.6 C), temperature source Oral, resp. rate (!) 21, height 5\' 6"  (1.676 m), weight 80.6 kg, SpO2 98 %. Physical Exam  Constitutional: He is oriented to person, place, and time. He appears well-developed. No distress.  HENT:  Healing incision, left fronto-parietal area.   Eyes: Pupils are equal, round, and reactive to  light. EOM are normal.  Neck: Normal range of motion. No tracheal deviation present. No thyromegaly present.  Cardiovascular: Normal rate and regular rhythm.  Respiratory: Effort normal. No respiratory distress. He has no wheezes.  GI: Soft. He exhibits no distension. There is no abdominal tenderness.  Musculoskeletal:        General: No edema.  Neurological: He is alert and oriented to person, place, and time.  CN exam non-focal. Reasonable insight and awareness. Language and intact. Speech clear. RUE: 4/5 prox to distal. LUE 4+/5. RLE 4- to 4/5. LLE 4 to 4+/5. Senses pain in al 4's. No focal CN deficits.   Skin: Skin is warm. No erythema.  Psychiatric: He has a normal mood and affect. His behavior is normal.      Lab Results Last 48 Hours        Results for orders placed or performed during the  hospital encounter of 02/21/19 (from the past 48 hour(s))  Comprehensive metabolic panel     Status: Abnormal    Collection Time: 03/02/19  5:40 AM  Result Value Ref Range    Sodium 135 135 - 145 mmol/L    Potassium 3.2 (L) 3.5 - 5.1 mmol/L    Chloride 95 (L) 98 - 111 mmol/L    CO2 27 22 - 32 mmol/L    Glucose, Bld 27 (LL) 70 - 99 mg/dL      Comment: CRITICAL RESULT CALLED TO, READ BACK BY AND VERIFIED WITH: JONES,K RN 03/02/2019 0653 JORDANS      BUN 20 8 - 23 mg/dL    Creatinine, Ser 0.55 (L) 0.61 - 1.24 mg/dL    Calcium 8.8 (L) 8.9 - 10.3 mg/dL    Total Protein 5.9 (L) 6.5 - 8.1 g/dL    Albumin 2.9 (L) 3.5 - 5.0 g/dL    AST 43 (H) 15 - 41 U/L    ALT 48 (H) 0 - 44 U/L    Alkaline Phosphatase 52 38 - 126 U/L    Total Bilirubin 0.6 0.3 - 1.2 mg/dL    GFR calc non Af Amer >60 >60 mL/min    GFR calc Af Amer >60 >60 mL/min    Anion gap 13 5 - 15      Comment: Performed at [facility], [address], [place], [place] [address]  Phenytoin level, total     Status: Abnormal    Collection Time: 03/02/19  5:40 AM  Result Value Ref Range    Phenytoin Lvl 6.1 (L) 10.0 - 20.0 ug/mL       Comment: Performed at [facility] [address] [place], [place] [address]  Glucose, capillary     Status: None    Collection Time: 03/02/19  6:55 AM  Result Value Ref Range    Glucose-Capillary 90 70 - 99 mg/dL      Imaging Results (Last 48 hours)  No results found.           Medical Problem List and Plan: 1.  Functional and mobility deficits secondary to left fronto parietal SDH             -admit to inpatient rehab 2.  Antithrombotics: -DVT/anticoagulation:  Mechanical: Sequential compression devices, below knee Bilateral lower extremities             -antiplatelet therapy: Off ASA 3. Pain Management: Hydrocodone prn.   4. Mood: LCSW to follow for evaluation and support.             -antipsychotic agents: N/A 5. Neuropsych: This patient is capable of making decisions on his own behalf. 6. Skin/Wound Care: Monitor incision for healing.  7. Fluids/Electrolytes/Nutrition: Monitor I/O. Check lytes in am.  8. New onset seizures/Status epilepticus?: On  Keppra 1000 mg bid and Dilantin 100 mg tid.  9. OSA: Encourage compliance with CPAP 10. BPH: Resume Flomax --was on 0.8 mg/HS.              -monitor urine output and residucals. 11. CKD: Improved with IVF for hydration. Has history of peripheral edema--d/c IVF to avoid overload.              -daily weights 13. HTN: Monitor BP tid--continue to hold Norvasc, Lisinopril and propranolol (question for tremors). Inconsistent control in last 24 hours.  14. Hypokalemia: Needs to be supplemented. Will add K dur.  15. New onset A fib: Monitor HR bid.  Continue metoprolol 12.5 mg bid which was initiated today  16. Hypoxia: Encourage IS. Continue to wean oxygen as tolerated.  Will order CXR to rule out fluid overload or infectious etiology.     Post Admission Physician Evaluation: 1. Functional deficits secondary  to TBI. 2. Patient is admitted to receive collaborative, interdisciplinary care between the physiatrist, rehab nursing  staff, and therapy team. 3. Patient's level of medical complexity and substantial therapy needs in context of that medical necessity cannot be provided at a lesser intensity of care such as a SNF. 4. Patient has experienced substantial functional loss from his/her baseline which was documented above under the "Functional History" and "Functional Status" headings.  Judging by the patient's diagnosis, physical exam, and functional history, the patient has potential for functional progress which will result in measurable gains while on inpatient rehab.  These gains will be of substantial and practical use upon discharge  in facilitating mobility and self-care at the household level. 5. Physiatrist will provide 24 hour management of medical needs as well as oversight of the therapy plan/treatment and provide guidance as appropriate regarding the interaction of the two. 6. The Preadmission Screening has been reviewed and patient status is unchanged unless otherwise stated above. 7. 24 hour rehab nursing will assist with bladder management, bowel management, safety, skin/wound care, disease management, medication administration, pain management and patient education  and help integrate therapy concepts, techniques,education, etc. 8. PT will assess and treat for/with: Lower extremity strength, range of motion, stamina, balance, functional mobility, safety, adaptive techniques and equipment, NMR.   Goals are: supervision. 9. OT will assess and treat for/with: ADL's, functional mobility, safety, upper extremity strength, adaptive techniques and equipment, NMR.   Goals are: supervision. Therapy may proceed with showering this patient. SLP will assess and treat for/with: cognition, communication, family ed.  Goals are: supervision. 10. Case Management and Social Worker will assess and treat for psychological issues and discharge planning. 11. Team conference will be held weekly to assess progress toward goals and to  determine barriers to discharge. 12. Patient will receive at least 3 hours of therapy per day at least 5 days per week. 13. ELOS: 7-10 days       14. Prognosis:  excellent   I have personally performed a face to face diagnostic evaluation of this patient and formulated the key components of the plan.  Additionally, I have personally reviewed laboratory data, imaging studies, as well as relevant notes and concur with the physician assistant's documentation above.  Meredith Staggers, MD, [facility]rown     Bary Leriche, PA-C 03/02/2019

## 2019-03-02 NOTE — Progress Notes (Signed)
Patient ID: Preston Hill, male   DOB: [birth year], 85 y.o.   MRN: [id number] Admit to unit, oriented to unit, schedule, medications and plan of care.  states an understanding of information reviewed. Margarito Liner

## 2019-03-02 NOTE — Progress Notes (Signed)
Inpatient Rehabilitation-Admissions Coordinator   [facility] has received medical clearance by Dr. Annette Stable for admit to CIR today. Pt and family in agreement for CIR. AC has updated pt, RN, and CM on plan.   Please call if questions.   Jhonnie Garner, OTR/L  Rehab Admissions Coordinator  [phone] 03/02/2019 2:42 PM

## 2019-03-02 NOTE — Progress Notes (Signed)
DAILY PROGRESS NOTE   Patient Name: Preston Hill Date of Encounter: 03/02/2019 Cardiologist: No primary care provider on file.  Chief Complaint   No complaints  Patient Profile   Preston Hill is a 85 y.o. male with a hx of dyslipidemia and first degree aV block who is being seen today for the evaluation of atrial fib with a  RVR at the request of Dr. Posey Pronto  Subjective   No issues overnight. HR generally well-controlled. Not an anticoagulation candidate at this point.  Objective   Vitals:   03/02/19 0500 03/02/19 0600 03/02/19 0700 03/02/19 0800  BP: 108/66 (!) 151/92 (!) 151/81 (!) 143/97  Pulse: 73 85 98 (!) 103  Resp: 19 17 16  (!) 22  Temp:    98.1 F (36.7 C)  TempSrc:    Axillary  SpO2: 90% (!) 89% (!) 89% 93%  Weight:      Height:        Intake/Output Summary (Last 24 hours) at 03/02/2019 1000 Last data filed at 03/02/2019 0800 Gross per 24 hour  Intake 507.6 ml  Output 550 ml  Net -42.4 ml   Filed Weights   02/21/19 1910  Weight: 80.6 kg    Physical Exam   General appearance: alert and no distress Lungs: diminished breath sounds bibasilar Heart: irregularly irregular rhythm Extremities: extremities normal, atraumatic, no cyanosis or edema Neurologic: Grossly normal  Inpatient Medications    Scheduled Meds: . Chlorhexidine Gluconate Cloth  6 each Topical Daily  . levETIRAcetam  1,000 mg Oral BID  . mouth rinse  15 mL Mouth Rinse BID  . pantoprazole  40 mg Oral Daily  . phenytoin  100 mg Oral TID    Continuous Infusions: . sodium chloride 75 mL/hr at 02/25/19 1235  . sodium chloride    . diltiazem (CARDIZEM) infusion 5 mg/hr (03/02/19 0800)    PRN Meds: sodium chloride, acetaminophen **OR** acetaminophen, HYDROcodone-acetaminophen, HYDROmorphone (DILAUDID) injection, labetalol, LORazepam, ondansetron **OR** ondansetron (ZOFRAN) IV, promethazine   Labs   Results for orders placed or performed during the hospital encounter of 02/21/19  (from the past 48 hour(s))  Comprehensive metabolic panel     Status: Abnormal   Collection Time: 03/02/19  5:40 AM  Result Value Ref Range   Sodium 135 135 - 145 mmol/L   Potassium 3.2 (L) 3.5 - 5.1 mmol/L   Chloride 95 (L) 98 - 111 mmol/L   CO2 27 22 - 32 mmol/L   Glucose, Bld 27 (LL) 70 - 99 mg/dL    Comment: CRITICAL RESULT CALLED TO, READ BACK BY AND VERIFIED WITH: JONES,K RN 03/02/2019 0653 JORDANS    BUN 20 8 - 23 mg/dL   Creatinine, Ser 0.55 (L) 0.61 - 1.24 mg/dL   Calcium 8.8 (L) 8.9 - 10.3 mg/dL   Total Protein 5.9 (L) 6.5 - 8.1 g/dL   Albumin 2.9 (L) 3.5 - 5.0 g/dL   AST 43 (H) 15 - 41 U/L   ALT 48 (H) 0 - 44 U/L   Alkaline Phosphatase 52 38 - 126 U/L   Total Bilirubin 0.6 0.3 - 1.2 mg/dL   GFR calc non Af Amer >60 >60 mL/min   GFR calc Af Amer >60 >60 mL/min   Anion gap 13 5 - 15    Comment: Performed at [facility], [place] [address] [place], [place] [address]  Phenytoin level, total     Status: Abnormal   Collection Time: 03/02/19  5:40 AM  Result Value Ref  Range   Phenytoin Lvl 6.1 (L) 10.0 - 20.0 ug/mL    Comment: Performed at [facility] [address], [place], [place] [address]  Glucose, capillary     Status: None   Collection Time: 03/02/19  6:55 AM  Result Value Ref Range   Glucose-Capillary 90 70 - 99 mg/dL    ECG   N/A  Telemetry   AFib with CVR - Personally Reviewed  Radiology    No results found.  Cardiac Studies   N/A  Assessment   1. Active Problems: 2.   Subdural hematoma ([facility]) 3.   Hypokalemia 4.   Hyperglycemia 5.   Atrial fibrillation with rapid ventricular response ([place]) 6.   Plan   1. Reasonable rate-control overnight - HR 80-100's. Neurosurgery working toward rehab transfer. Not an anticoagulation candidate at this point, but would recommend when able. Will try low dose metoprolol tartrate for additional rate control - monitor for bradycardia given intrinsic conduction delay.  [facility] will sign off.    Medication Recommendations:  Metoprolol tartrate 12.5 mg BID Other recommendations (labs, testing, etc):  Anticoagulation with Eliquis per neurosurgery when feasible from a bleeding standpoint Follow up as an outpatient:  Afib clinic or Dr. Lovena Le   Time Spent Directly with Patient:  I have spent a total of 25 minutes with the patient reviewing hospital notes, telemetry, EKGs, labs and examining the patient as well as establishing an assessment and plan that was discussed personally with the patient.  > 50% of time was spent in direct patient care.  Length of Stay:  LOS: 9 days   Pixie Casino, MD, [facility], [place] Director of the [facility] Lipid Disorders &  Cardiovascular Risk Reduction Clinic Diplomate of the American Board of Clinical Lipidology Attending Cardiologist  Direct Dial: [phone]  Fax: [phone]  Website:  [email or web address]  Nadean Corwin Hilty 03/02/2019, 10:00 AM

## 2019-03-02 NOTE — Progress Notes (Signed)
Kirsteins, Luanna Salk, MD  Physician  Physical Medicine and Rehabilitation  Consult Note  Signed  Date of Service:  02/25/2019 10:57 AM       Related encounter: Admission (Discharged) from 02/21/2019 in [place] NEURO/TRAUMA/SURGICAL ICU      Signed      Expand All Collapse All         Physical Medicine and Rehabilitation Consult   Reason for Consult: SDH Referring Physician: Dr. Annette Stable    HPI: Preston Hill is a 85 y.o. male with history of HTN, CKD, OSA, tremors who was admitted on 02/21/19 with 2-3 days of HA, tendency to drag foot and decrease in dexterity RUE with balance deficits. He was found to have RUE/RLE weakness and CT head revealed acute on chronic left SDH with left to right shift and effacement of frontal and parietal lobes. He was taken to OR the same day for burr hole evacuation of left frontal and parietal bleed and placement of subdural drain by Dr. Annette Stable. Post op with lip smacking movements concerning for seizures and he was started on dilantin and Keppra. He had generalized seizure with foaming of mouth and decline in MS and continued to be post ictal with LUE movements. He was loaded with Keppra and dose increased to 1000 mg bid on 5/24.  Follow up CT head showed interval evacuation of L-SDH with decrease in mass effect.  Follow up labs reveal ABLA and persistent hypokalemia. His mentation improving but he continues to be limited by right visual deficits with delayed processing and sequencing as well as balance deficits. CIR recommended due to functional decline.   Patient does not remember falls at home prior to admission. Review of Systems  Constitutional: Negative for chills and fever.  HENT: Positive for hearing loss and tinnitus.   Eyes: Negative for blurred vision and double vision.  Respiratory: Negative for cough and shortness of breath.   Cardiovascular: Negative for chest pain and palpitations.  Gastrointestinal: Negative for abdominal pain,  heartburn and nausea.  Genitourinary: Negative for dysuria and urgency.  Musculoskeletal: Positive for joint pain (knee pain). Negative for myalgias.  Skin: Negative for rash.  Neurological: Positive for sensory change. Negative for dizziness, speech change and headaches.  Psychiatric/Behavioral: Negative for hallucinations. The patient is not nervous/anxious.           Past Medical History:  Diagnosis Date  . Arthritis   . Cancer ([place])    Skin Cancer  . Chronic kidney disease   . Enlarged prostate   . Hypertension   . Occasional tremors   . Peripheral edema   . Sleep apnea    OSA---USE C-PAP  . Subdural hematoma ([place]) 02/21/2019  . Tinnitus of both ears          Past Surgical History:  Procedure Laterality Date  . BURR HOLE Left 02/21/2019   Procedure: BURR HOLES, Placement of Subdural drain;  Surgeon: Earnie Larsson, MD;  Location: [place];  Service: Neurosurgery;  Laterality: Left;  . COLONOSCOPY  3817/2006  . COLONOSCOPY WITH PROPOFOL N/A 02/01/2016   Procedure: COLONOSCOPY WITH PROPOFOL;  Surgeon: Robert Bellow, MD;  [facility] ENDOSCOPY;  Service: Endoscopy;  Laterality: N/A;  . EYE SURGERY Right    Cataract Extraction with IOL  . HERNIA REPAIR Left 1964   Inguinal Hernia Repair  . KNEE ARTHROSCOPY Left   . KNEE ARTHROSCOPY Right 06/26/2017   Procedure: ARTHROSCOPY KNEE WITH DEBRIDEMENT AND LOOSE BODY REMOVAL;  Surgeon: Corky Mull, MD;  Location: [place];  Service: Orthopedics;  Laterality: Right;  . KNEE ARTHROSCOPY WITH MEDIAL MENISECTOMY Left 05/02/2017   Procedure: KNEE ARTHROSCOPY WITH MEDIAL MENISECTOMY;  Surgeon: Corky Mull, MD;  Location: [facility] ORS;  Service: Orthopedics;  Laterality: Left;  . KNEE ARTHROSCOPY WITH MENISCAL REPAIR  05/02/2017   Procedure: KNEE ARTHROSCOPY WITH MENISCAL REPAIR;  Surgeon: Corky Mull, MD;  Location: [facility] ORS;  Service: Orthopedics;;  . SEPTOPLASTY  2003  . uvul  2004   repair          Family History  Problem Relation Age of Onset  . Hypertension Mother   . Alzheimer's disease Mother     Social History: Married. Independent PTA.  He reports that he has never smoked. He has never used smokeless tobacco. He reports that he does not drink alcohol or use drugs.         Allergies  Allergen Reactions  . Codeine Anxiety    Sick           Medications Prior to Admission  Medication Sig Dispense Refill  . acetaminophen (TYLENOL) 500 MG tablet Take 1,000 mg by mouth every 6 (six) hours as needed (for pain.).    Marland Kitchen amLODipine (NORVASC) 5 MG tablet Take 10 mg by mouth daily.     Marland Kitchen aspirin EC 81 MG tablet Take 81 mg by mouth daily.    . Glucosamine Sulfate 1000 MG CAPS Take 2,000 mg by mouth daily.     Marland Kitchen ibuprofen (ADVIL,MOTRIN) 200 MG tablet Take 400 mg by mouth every 8 (eight) hours as needed (for pain.).    Marland Kitchen lisinopril (PRINIVIL,ZESTRIL) 20 MG tablet Take 20 mg by mouth daily.    . potassium chloride SA (K-DUR,KLOR-CON) 20 MEQ tablet Take 20 mEq by mouth 2 (two) times daily.    . propranolol (INDERAL) 40 MG tablet Take 40 mg by mouth 2 (two) times daily.    . tamsulosin (FLOMAX) 0.4 MG CAPS capsule Take 0.8 mg by mouth at bedtime.     . vitamin E (VITAMIN E) 400 UNIT capsule Take 400 Units by mouth daily.      Home: Home Living Family/patient expects to be discharged to:: Inpatient rehab Living Arrangements: Spouse/significant other Available Help at Discharge: Family, Available 24 hours/day Type of Home: House Home Access: Stairs to enter [employer] of Steps: 4 Entrance Stairs-Rails: Right Home Layout: Two level Alternate Level Stairs-Number of Steps: 1 flight Alternate Level Stairs-Rails: Right Bathroom Toilet: Standard Home Equipment: None  Lives With: Spouse  Functional History: Prior Function Level of Independence: Independent Comments: Drives, independent PTA per chart. Difficult historian secondary to  aphasia. Functional Status:  Mobility: Bed Mobility Overal bed mobility: Needs Assistance Bed Mobility: Supine to Sit Rolling: Min assist Sidelying to sit: Mod assist General bed mobility comments: ModA for trunk elevation to get EOB Transfers Overall transfer level: Needs assistance Equipment used: Rolling walker (2 wheeled) Transfers: Sit to/from Stand Sit to Stand: Min assist General transfer comment: Min assist to power to stand Ambulation/Gait Ambulation/Gait assistance: Min assist Gait Distance (Feet): 60 Feet Assistive device: Rolling walker (2 wheeled) Gait Pattern/deviations: Step-through pattern, Shuffle, Decreased stride length General Gait Details: Pt with slow, overall fairly steady gait. However, does run into objects on right side, requiring assistance to correct walker onto straight path Gait velocity: decreased Gait velocity interpretation: <1.8 ft/sec, indicate of risk for recurrent falls  ADL: ADL Overall ADL's : Needs assistance/impaired Eating/Feeding: NPO Grooming: Moderate assistance Grooming Details (indicate cue  type and reason): supported sitting Upper Body Bathing: Moderate assistance Upper Body Bathing Details (indicate cue type and reason): supported sitting Lower Body Bathing: Maximal assistance Lower Body Bathing Details (indicate cue type and reason): +2 min A sit<>stand Upper Body Dressing : Moderate assistance Upper Body Dressing Details (indicate cue type and reason): supported sitting Lower Body Dressing: Maximal assistance Lower Body Dressing Details (indicate cue type and reason): +2 min A sit<>stand Toilet Transfer: Minimal assistance, +2 for physical assistance, Stand-pivot Toilet Transfer Details (indicate cue type and reason): Bil HHA Toileting- Clothing Manipulation and Hygiene: Total assistance Toileting - Clothing Manipulation Details (indicate cue type and reason): +2 min A sit<>stand  Cognition: Cognition Overall Cognitive  Status: Impaired/Different from baseline Arousal/Alertness: Awake/alert Orientation Level: Oriented to person, Oriented to place, Oriented to situation, Disoriented to time(when redirected pt was able to state month) Attention: Sustained Sustained Attention: Appears intact Memory: Impaired Memory Impairment: Storage deficit, Retrieval deficit Awareness: Impaired Awareness Impairment: Intellectual impairment Problem Solving: Impaired Problem Solving Impairment: Functional basic, Verbal basic Executive Function: Self Monitoring, Self Correcting Safety/Judgment: Impaired Cognition Arousal/Alertness: Awake/alert Behavior During Therapy: Flat affect Overall Cognitive Status: Impaired/Different from baseline Area of Impairment: Attention, Memory Orientation Level: Disoriented to, Place, Time Current Attention Level: Sustained Memory: Decreased short-term memory Following Commands: Follows one step commands with increased time Problem Solving: Slow processing, Requires verbal cues, Difficulty sequencing General Comments: A&Ox4, although did say it was "Monday," instead of "Wednesday." Difficult to assess due to: Impaired communication   Blood pressure (!) 142/100, pulse 82, temperature 98.5 F (36.9 C), temperature source Oral, resp. rate 18, height 5\' 6"  (1.676 m), weight 80.6 kg, SpO2 95 %. Physical Exam  Nursing note and vitals reviewed. Constitutional: He is oriented to person, place, and time. He appears well-developed and well-nourished.  HENT:  Head: Normocephalic and atraumatic.  Left scalp incision C/D/I with staples in place.   Eyes: Pupils are equal, round, and reactive to light. Conjunctivae and EOM are normal.  Left lid edema  Neck: Normal range of motion.  Cardiovascular: Normal rate, regular rhythm and normal heart sounds.  No murmur heard. Respiratory: Effort normal and breath sounds normal. No respiratory distress. He has no wheezes.  GI: Soft. Bowel sounds are  normal. He exhibits distension. There is no abdominal tenderness.  Musculoskeletal: Normal range of motion.        General: No edema.     Comments: No pain with upper limb or lower limb range of motion.  Neurological: He is alert and oriented to person, place, and time. He displays no tremor. No cranial nerve deficit or sensory deficit. He exhibits normal muscle tone. Gait abnormal.  Motor strength is 4+ in the right deltoid bicep tricep grip 5/5 in left deltoid bicep tricep grip 5/5 bilateral hip flexor knee extensor ankle dorsiflexor Patient has decreased finger to thumb opposition on the right side compared to left side.  Psychiatric: He has a normal mood and affect.  Patient is unable to spell world backwards Unable to perform serial sevens Immediate recall of 2/3 objects, delayed recall of 0/3 objects  LabResultsLast24Hours       Results for orders placed or performed during the hospital encounter of 02/21/19 (from the past 24 hour(s))  Basic metabolic panel     Status: Abnormal   Collection Time: 02/25/19  4:27 AM  Result Value Ref Range   Sodium 141 135 - 145 mmol/L   Potassium 3.0 (L) 3.5 - 5.1 mmol/L   Chloride 103 98 -  111 mmol/L   CO2 26 22 - 32 mmol/L   Glucose, Bld 121 (H) 70 - 99 mg/dL   BUN 18 8 - 23 mg/dL   Creatinine, Ser 0.79 0.61 - 1.24 mg/dL   Calcium 8.7 (L) 8.9 - 10.3 mg/dL   GFR calc non Af Amer >60 >60 mL/min   GFR calc Af Amer >60 >60 mL/min   Anion gap 12 5 - 15  Magnesium     Status: None   Collection Time: 02/25/19  4:27 AM  Result Value Ref Range   Magnesium 2.0 1.7 - 2.4 mg/dL      ImagingResults(Last48hours)  Dg Swallowing Func-speech Pathology  Result Date: 02/24/2019 Objective Swallowing Evaluation: Type of Study: MBS-Modified Barium Swallow Study  Patient Details Name: Preston Hill MRN: [id number] Date of Birth: June 22, 1934 Today's Date: 02/24/2019 Time: SLP Start Time (ACUTE ONLY): 1438 -SLP Stop Time (ACUTE ONLY): 1455  SLP Time Calculation (min) (ACUTE ONLY): 17 min Past Medical History: Past Medical History: Diagnosis Date . Arthritis  . Cancer ([place])   Skin Cancer . Chronic kidney disease  . Hypertension  . Occasional tremors  . Sleep apnea   OSA---USE C-PAP . Subdural hematoma ([place]) 02/21/2019 . Tinnitus of both ears  Past Surgical History: Past Surgical History: Procedure Laterality Date . BURR HOLE Left 02/21/2019  Procedure: BURR HOLES, Placement of Subdural drain;  Surgeon: Earnie Larsson, MD;  Location: [place];  Service: Neurosurgery;  Laterality: Left; . COLONOSCOPY  3817/2006 . COLONOSCOPY WITH PROPOFOL N/A 02/01/2016  Procedure: COLONOSCOPY WITH PROPOFOL;  Surgeon: Robert Bellow, MD;  Location[facility] ENDOSCOPY;  Service: Endoscopy;  Laterality: N/A; . EYE SURGERY Right   Cataract Extraction with IOL . HERNIA REPAIR Left 1964  Inguinal Hernia Repair . KNEE ARTHROSCOPY Left  . KNEE ARTHROSCOPY Right 06/26/2017  Procedure: ARTHROSCOPY KNEE WITH DEBRIDEMENT AND LOOSE BODY REMOVAL;  Surgeon: Corky Mull, MD;  Location: [place];  Service: Orthopedics;  Laterality: Right; . KNEE ARTHROSCOPY WITH MEDIAL MENISECTOMY Left 05/02/2017  Procedure: KNEE ARTHROSCOPY WITH MEDIAL MENISECTOMY;  Surgeon: Corky Mull, MD;  Location: [facility] ORS;  Service: Orthopedics;  Laterality: Left; . KNEE ARTHROSCOPY WITH MENISCAL REPAIR  05/02/2017  Procedure: KNEE ARTHROSCOPY WITH MENISCAL REPAIR;  Surgeon: Corky Mull, MD;  Location: [facility] ORS;  Service: Orthopedics;; . SEPTOPLASTY  2003 . uvul  2004  repair HPI: 85 y/o who presented with increasing R-sided weakness from large L chronic subdural hematoma s/p evacuation 5/23. He developed seizure activity post-op and also on 5/24. PMH: Arthritis, Cancer, CKD, HTN, Occasional tremors, OSA, and Tinnitus of both ears.  Subjective: pt drowsy, needs cues Assessment / Plan / Recommendation CHL IP CLINICAL IMPRESSIONS 02/24/2019 Clinical Impression Pt exhibited functional oral and pharyngeal swallowing  ability with suspected primary esophageal dysphagia. He did exhibit mild vallecular residue with solid texture with minimal impact to function. Timing and protective features of pharyngeal/laryngeal swallow were intact without penetration or aspiration although he continued to throat clear. Esophagus scanned revealing slower transit through lower esophageal sphincter which may be the cause or contribute to frequent throat clearing. Recommend regular texture, thin liquids, pills with water and esophageal precautions. ST will continue to follow short term for education re: esophageal strategies . SLP Visit Diagnosis Dysphagia, unspecified (R13.10) Attention and concentration deficit following -- Frontal lobe and executive function deficit following -- Impact on safety and function Mild aspiration risk   CHL IP TREATMENT RECOMMENDATION 02/24/2019 Treatment Recommendations Therapy as outlined in treatment plan below  Prognosis 02/24/2019 Prognosis for Safe Diet Advancement Good Barriers to Reach Goals -- Barriers/Prognosis Comment -- CHL IP DIET RECOMMENDATION 02/24/2019 SLP Diet Recommendations Regular solids;Thin liquid Liquid Administration via Cup;Straw Medication Administration Whole meds with liquid Compensations Small sips/bites;Slow rate Postural Changes Seated upright at 90 degrees;Remain semi-upright after after feeds/meals (Comment)   CHL IP OTHER RECOMMENDATIONS 02/24/2019 Recommended Consults -- Oral Care Recommendations Oral care BID Other Recommendations --   CHL IP FOLLOW UP RECOMMENDATIONS 02/24/2019 Follow up Recommendations Inpatient Rehab   CHL IP FREQUENCY AND DURATION 02/24/2019 Speech Therapy Frequency (ACUTE ONLY) min 1 x/week Treatment Duration 1 week      CHL IP ORAL PHASE 02/24/2019 Oral Phase WFL Oral - Pudding Teaspoon -- Oral - Pudding Cup -- Oral - Honey Teaspoon -- Oral - Honey Cup -- Oral - Nectar Teaspoon -- Oral - Nectar Cup -- Oral - Nectar Straw -- Oral - Thin Teaspoon -- Oral - Thin Cup  -- Oral - Thin Straw -- Oral - Puree -- Oral - Mech Soft -- Oral - Regular -- Oral - Multi-Consistency -- Oral - Pill -- Oral Phase - Comment --  CHL IP PHARYNGEAL PHASE 02/24/2019 Pharyngeal Phase WFL Pharyngeal- Pudding Teaspoon -- Pharyngeal -- Pharyngeal- Pudding Cup -- Pharyngeal -- Pharyngeal- Honey Teaspoon -- Pharyngeal -- Pharyngeal- Honey Cup -- Pharyngeal -- Pharyngeal- Nectar Teaspoon -- Pharyngeal -- Pharyngeal- Nectar Cup -- Pharyngeal -- Pharyngeal- Nectar Straw -- Pharyngeal -- Pharyngeal- Thin Teaspoon -- Pharyngeal -- Pharyngeal- Thin Cup -- Pharyngeal -- Pharyngeal- Thin Straw -- Pharyngeal -- Pharyngeal- Puree -- Pharyngeal -- Pharyngeal- Mechanical Soft -- Pharyngeal -- Pharyngeal- Regular -- Pharyngeal -- Pharyngeal- Multi-consistency -- Pharyngeal -- Pharyngeal- Pill -- Pharyngeal -- Pharyngeal Comment --  CHL IP CERVICAL ESOPHAGEAL PHASE 02/24/2019 Cervical Esophageal Phase WFL Pudding Teaspoon -- Pudding Cup -- Honey Teaspoon -- Honey Cup -- Nectar Teaspoon -- Nectar Cup -- Nectar Straw -- Thin Teaspoon -- Thin Cup -- Thin Straw -- Puree -- Mechanical Soft -- Regular -- Multi-consistency -- Pill -- Cervical Esophageal Comment -- [place] Siren 02/24/2019, 5:05 PM  Orbie Pyo Colvin Caroli.Ed Actor Pager [phone] Office [phone]                Assessment/Plan: Diagnosis: Acute on chronic left subdural hematoma, with cognitive dysfunction and poor balance as well as right upper extremity weakness 1. Does the need for close, 24 hr/day medical supervision in concert with the patient's rehab needs make it unreasonable for this patient to be served in a less intensive setting? Yes 2. Co-Morbidities requiring supervision/potential complications: Hypertension, chronic kidney disease, obstructive sleep apnea, 3. Due to bladder management, bowel management, safety, skin/wound care, disease management, medication administration, pain management and patient  education, does the patient require 24 hr/day rehab nursing? Yes 4. Does the patient require coordinated care of a physician, rehab nurse, PT (1-2 hrs/day, 5 days/week), OT (1-2 hrs/day, 5 days/week) and SLP (.5-1 hrs/day, 5 days/week) to address physical and functional deficits in the context of the above medical diagnosis(es)? Yes Addressing deficits in the following areas: balance, endurance, locomotion, strength, transferring, bowel/bladder control, bathing, dressing, feeding, grooming, toileting, cognition, speech, language, swallowing and psychosocial support 5. Can the patient actively participate in an intensive therapy program of at least 3 hrs of therapy per day at least 5 days per week? Yes 6. The potential for patient to make measurable gains while on inpatient rehab is good 7. Anticipated functional outcomes upon discharge from inpatient rehab are supervision  with PT,  supervision with OT, supervision with SLP. 8. Estimated rehab length of stay to reach the above functional goals is: 7-10d 9. Anticipated D/C setting: Home 10. Anticipated post D/C treatments: [place] therapy 11. Overall Rehab/Functional Prognosis: good  RECOMMENDATIONS: This patient's condition is appropriate for continued rehabilitative care in the following setting: CIR Patient has agreed to participate in recommended program. Yes Note that insurance prior authorization may be required for reimbursement for recommended care.  Comment: Patient indicates that his wife can assist at home however this needs to be verified due to his cognitive dysfunction  "I have personally performed a face to face diagnostic evaluation of this patient.  Additionally, I have reviewed and concur with the physician assistant's documentation above."  Charlett Blake M.D. [employer] [facility] FAAPM&R (Sports Med, Neuromuscular Med) Diplomate Am Board of [place], PA-C 02/25/2019         Revision History                        Routing History

## 2019-03-03 ENCOUNTER — Inpatient Hospital Stay (HOSPITAL_COMMUNITY): Payer: Medicare HMO | Admitting: Occupational Therapy

## 2019-03-03 ENCOUNTER — Inpatient Hospital Stay (HOSPITAL_COMMUNITY): Payer: Medicare HMO | Admitting: Physical Therapy

## 2019-03-03 ENCOUNTER — Inpatient Hospital Stay (HOSPITAL_COMMUNITY): Payer: Medicare HMO

## 2019-03-03 DIAGNOSIS — I10 Essential (primary) hypertension: Secondary | ICD-10-CM

## 2019-03-03 DIAGNOSIS — R0902 Hypoxemia: Secondary | ICD-10-CM

## 2019-03-03 DIAGNOSIS — N400 Enlarged prostate without lower urinary tract symptoms: Secondary | ICD-10-CM

## 2019-03-03 DIAGNOSIS — R7309 Other abnormal glucose: Secondary | ICD-10-CM

## 2019-03-03 DIAGNOSIS — D62 Acute posthemorrhagic anemia: Secondary | ICD-10-CM

## 2019-03-03 DIAGNOSIS — R509 Fever, unspecified: Secondary | ICD-10-CM

## 2019-03-03 DIAGNOSIS — E46 Unspecified protein-calorie malnutrition: Secondary | ICD-10-CM

## 2019-03-03 DIAGNOSIS — R609 Edema, unspecified: Secondary | ICD-10-CM

## 2019-03-03 DIAGNOSIS — I4891 Unspecified atrial fibrillation: Secondary | ICD-10-CM

## 2019-03-03 DIAGNOSIS — R569 Unspecified convulsions: Secondary | ICD-10-CM

## 2019-03-03 DIAGNOSIS — E876 Hypokalemia: Secondary | ICD-10-CM

## 2019-03-03 DIAGNOSIS — S065X9A Traumatic subdural hemorrhage with loss of consciousness of unspecified duration, initial encounter: Secondary | ICD-10-CM

## 2019-03-03 LAB — COMPREHENSIVE METABOLIC PANEL
ALT: 47 U/L — ABNORMAL HIGH (ref 0–44)
AST: 41 U/L (ref 15–41)
Albumin: 2.6 g/dL — ABNORMAL LOW (ref 3.5–5.0)
Alkaline Phosphatase: 45 U/L (ref 38–126)
Anion gap: 12 (ref 5–15)
BUN: 21 mg/dL (ref 8–23)
CO2: 27 mmol/L (ref 22–32)
Calcium: 8.5 mg/dL — ABNORMAL LOW (ref 8.9–10.3)
Chloride: 98 mmol/L (ref 98–111)
Creatinine, Ser: 0.81 mg/dL (ref 0.61–1.24)
GFR calc Af Amer: >60 mL/min (ref >60)
GFR calc non Af Amer: >60 mL/min (ref >60)
Glucose, Bld: 109 mg/dL — ABNORMAL HIGH (ref 70–99)
Potassium: 3.5 mmol/L (ref 3.5–5.1)
Sodium: 137 mmol/L (ref 135–145)
Total Bilirubin: 0.8 mg/dL (ref 0.3–1.2)
Total Protein: 5 g/dL — ABNORMAL LOW (ref 6.5–8.1)

## 2019-03-03 LAB — CBC WITH DIFFERENTIAL/PLATELET
Abs Immature Granulocytes: 0.04 10*3/uL (ref 0.00–0.07)
Basophils Absolute: 0 10*3/uL (ref 0.0–0.1)
Basophils Relative: 0 %
Eosinophils Absolute: 0.1 10*3/uL (ref 0.0–0.5)
Eosinophils Relative: 1 %
HCT: 32.6 % — ABNORMAL LOW (ref 39.0–52.0)
Hemoglobin: 11.5 g/dL — ABNORMAL LOW (ref 13.0–17.0)
Immature Granulocytes: 1 %
Lymphocytes Relative: 17 %
Lymphs Abs: 1.2 10*3/uL (ref 0.7–4.0)
MCH: 31 pg (ref 26.0–34.0)
MCHC: 35.3 g/dL (ref 30.0–36.0)
MCV: 87.9 fL (ref 80.0–100.0)
Monocytes Absolute: 0.7 10*3/uL (ref 0.1–1.0)
Monocytes Relative: 10 %
Neutro Abs: 5 10*3/uL (ref 1.7–7.7)
Neutrophils Relative %: 71 %
Platelets: 155 10*3/uL (ref 150–400)
RBC: 3.71 MIL/uL — ABNORMAL LOW (ref 4.22–5.81)
RDW: 12.1 % (ref 11.5–15.5)
WBC: 6.9 10*3/uL (ref 4.0–10.5)
nRBC: 0 % (ref 0.0–0.2)

## 2019-03-03 LAB — MAGNESIUM: Magnesium: 1.3 mg/dL — ABNORMAL LOW (ref 1.7–2.4)

## 2019-03-03 MED ORDER — PRO-STAT SUGAR FREE PO LIQD
30.0000 mL | Freq: Two times a day (BID) | ORAL | Status: DC
  Administered 2019-03-03 – 2019-03-11 (×16): 30 mL via ORAL
  Filled 2019-03-03 (×16): qty 30

## 2019-03-03 MED ORDER — MAGNESIUM SULFATE 4 GM/100ML IV SOLN
4.0000 g | Freq: Once | INTRAVENOUS | Status: AC
  Administered 2019-03-03: 4 g via INTRAVENOUS
  Filled 2019-03-03: qty 100

## 2019-03-03 NOTE — Evaluation (Signed)
Physical Therapy Assessment and Plan  Patient Details  Name: Preston Hill MRN: [id number] Date of Birth: [birth year]  PT Diagnosis: Abnormality of gait, Difficulty walking and Muscle weakness Rehab Potential: Good ELOS: 7-10 days   Today's Date: 03/03/2019 PT Individual Time: 1157-2620 PT Individual Time Calculation (min): 70 min    Problem List:  Patient Active Problem List   Diagnosis Date Noted  . Hypoxia   . Seizures ([place])   . Benign prostatic hyperplasia   . Benign essential HTN   . New onset atrial fibrillation ([place])   . Hypoalbuminemia due to protein-calorie malnutrition ([place])   . FUO (fever of unknown origin)   . Acute blood loss anemia   . Labile blood glucose   . SDH (subdural hematoma) ([place]) 03/02/2019  . Hypokalemia   . Hyperglycemia   . Atrial fibrillation with rapid ventricular response ([place])   . Subdural hematoma ([place]) 02/21/2019  . Personal history of other malignant neoplasm of skin 05/20/2018  . Bilateral leg edema 05/14/2018  . Injury of tendon of long head of left biceps 03/31/2018  . Tremor 11/06/2017  . First degree AV block 07/09/2017  . Loose body of right knee 07/03/2017  . Primary osteoarthritis of right knee 07/03/2017  . Bradycardia 06/20/2017  . Primary osteoarthritis of left knee 03/22/2017  . Encounter for screening colonoscopy 12/20/2015  . OSA (obstructive sleep apnea) 09/23/2014  . Abnormal ECG 02/26/2014  . Essential hypertension 02/26/2014  . Hyperlipidemia, mixed 02/26/2014    Past Medical History:  Past Medical History:  Diagnosis Date  . Arthritis   . Chronic kidney disease   . Enlarged prostate   . Hypertension   . Occasional tremors   . Peripheral edema   . Sleep apnea    OSA---USE C-PAP  . Subdural hematoma ([place]) 02/21/2019  . Tinnitus of both ears    Past Surgical History:  Past Surgical History:  Procedure Laterality Date  . BURR HOLE Left 02/21/2019   Procedure: BURR HOLES, Placement of Subdural drain;  Surgeon:  Earnie Larsson, MD;  Location: [facility];  Service: Neurosurgery;  Laterality: Left;  . COLONOSCOPY  3817/2006  . COLONOSCOPY WITH PROPOFOL N/A 02/01/2016   Procedure: COLONOSCOPY WITH PROPOFOL;  Surgeon: Robert Bellow, MD;  Location[facility] END[facility]COPY;  Service: Endoscopy;  Laterality: N/A;  . EYE SURGERY Right    Cataract Extraction with IOL  . HERNIA REPAIR Left 1964   Inguinal Hernia Repair  . KNEE ARTHROSCOPY Left   . KNEE ARTHROSCOPY Right 06/26/2017   Procedure: ARTHROSCOPY KNEE WITH DEBRIDEMENT AND LOOSE BODY REMOVAL;  Surgeon: Corky Mull, MD;  Location: [facility];  Service: Orthopedics;  Laterality: Right;  . KNEE ARTHROSCOPY WITH MEDIAL MENISECTOMY Left 05/02/2017   Procedure: KNEE ARTHROSCOPY WITH MEDIAL MENISECTOMY;  Surgeon: Corky Mull, MD;  Location: [facility] ORS;  Service: Orthopedics;  Laterality: Left;  . KNEE ARTHROSCOPY WITH MENISCAL REPAIR  05/02/2017   Procedure: KNEE ARTHROSCOPY WITH MENISCAL REPAIR;  Surgeon: Corky Mull, MD;  Location: [facility] ORS;  Service: Orthopedics;;  . SEPTOPLASTY  2003  . uvul  2004   repair    Assessment & Plan Clinical Impression: Patient is a 85 year old male with history of OSA, HTN, BPH, chronic tinnitus who was admitted on 02/21/19 with 2-3 day history of HA progressing to right sided weakness and difficulty walking. He was found to have acute on chronic large left SDH with left to right shift and effacement of frontal and parietal lobes.  He was on low dose ASA and denies any falls or recent trauma. He was taken to OR the same day for burr hole evacuation with placement of subdural drain by Dr. Annette Stable. Post noted started on dilantin and Keppra due to abnormal lip smacking movement. He had recurrent seizures--generalized motor activity with foaming and decline in MS and ongoing LUE motor activity. Keppra increased to 1000 mg bid. Follow up CT head showed interval evacuation of L-SDH with decrease in mass effect. Lethargy has resolved and  patient noted to have residual right visual field deficits with delayed processing and balance deficits. Follow up labs reveal persistent hypokalemia as well as ABLA. He continues to have tachycardia with HR up to 120's and new onset A fib. Cardiology consulted for input and he was started on IV cardizem--transitioned to low dose metoprolol today with recommendations for close monitoring due to hx of 1 degree AVB. Eliquis recommended when cleared by NS and to follow up with Dr. Lovena Le past discharge. Therapy ongoingand patient continues to have balance deficits with shuffling gait and hypoxia with activity--requires 4 L per [place].CIR recommended due to functional deficits. Patient transferred to CIR on 03/02/2019 .   Patient currently requires min with mobility secondary to muscle weakness, decreased cardiorespiratoy endurance and decreased oxygen support and decreased standing balance and decreased balance strategies.  Prior to hospitalization, patient was independent  with mobility and lived with Spouse in a House home.  Home access is 4Stairs to enter.  Patient will benefit from skilled PT intervention to maximize safe functional mobility, minimize fall risk and decrease caregiver burden for planned discharge home with 24 hour supervision.  Anticipate patient will benefit from follow up [place] at discharge.  PT - End of Session Activity Tolerance: Tolerates < 10 min activity with changes in vital signs Endurance Deficit: Yes Endurance Deficit Description: decreased, needs up to 4 L/min supplemental O2 during mobility  PT Assessment Rehab Potential (ACUTE/IP ONLY): Good PT Barriers to Discharge: Medical stability;New oxygen PT Barriers to Discharge Comments: not on O2 at baseline PT Patient demonstrates impairments in the following area(s): Balance;Endurance;Safety PT Transfers Functional Problem(s): Furniture;Bed to Chair;Bed Mobility;Car;Floor PT Locomotion Functional Problem(s):  Stairs;Ambulation PT Plan PT Intensity: Minimum of 1-2 x/day ,45 to 90 minutes PT Frequency: 5 out of 7 days PT Duration Estimated Length of Stay: 7-10 days PT Treatment/Interventions: Cognitive remediation/compensation;Discharge planning;Ambulation/gait training;Community reintegration;Balance/vestibular training;Disease Editor, commissioning;Functional mobility training;Psychosocial support;Pain management;Splinting/orthotics;Therapeutic Activities;UE/LE Strength taining/ROM;UE/LE Coordination activities;Stair training;Therapeutic Exercise;Patient/family education;Neuromuscular re-education;Functional electrical stimulation;Skin care/wound management PT Transfers Anticipated Outcome(s): supervision PT Locomotion Anticipated Outcome(s): supervision household gait PT Recommendation Follow Up Recommendations: Home health PT Patient destination: Home Equipment Recommended: To be determined Equipment Details: has cane and rollator  Skilled Therapeutic Intervention  Pt in supine and agreeable to therapy, no c/o pain. Performed bed mobility, stand pivot transfers, gait, and stair negotiation as detailed below. Additionally performed car transfer w/ min assist. Pt on 4 L/min O2 throughout session, spO2 @ 95% after ambulating 150', HR 90 bpm. Increased work of breathing w/ all mobility, needs frequent but brief rest breaks to recover, pt states this is not his baseline. Returned to room via w/c. Transferred to recliner. Worked on weaning O2 from 4 L/min, turned down to 3L and then 2 L/min, O2 remained >95% w/ no increase in work of breathing w/ decreased supplemental O2. Called wife to update her on CLOF, PT POC, and LTGs. Instructed pt in results of PT evaluation as detailed below, PT POC, rehab potential, rehab  goals, and discharge recommendations. Both wife and pt verbalized understanding and in agreement. Additionally discussed CIR's policies regarding fall safety and  use of chair alarm and/or quick release belt. Pt verbalized understanding and in agreement. Ended session in recliner, all needs in reach.  PT Evaluation Precautions/Restrictions Precautions Precautions: Fall Precaution Comments: watch HR, 02 Restrictions Weight Bearing Restrictions: No Home Living/Prior Functioning Home Living Available Help at Discharge: Family;Available 24 hours/day Type of Home: House Home Access: Stairs to enter [employer] of Steps: 4 Entrance Stairs-Rails: Right(wall on other side) Home Layout: One level Biochemist, clinical: Standard Bathroom Accessibility: Yes  Lives With: Spouse Prior Function Level of Independence: Independent with basic ADLs;Independent with gait;Independent with homemaking with ambulation;Independent with transfers  Able to Take Stairs?: Yes Driving: Yes Vocation: Retired Vision/Perception  Vision - Risk analyst: Within Passenger transport manager Range of Motion: Within Functional Limits Alignment/Gaze Preference: Within Defined Limits Tracking/Visual Pursuits: Able to track stimulus in all quads without difficulty Saccades: Within functional limits Convergence: Within functional limits Perception Perception: Within Functional Limits Praxis Praxis: Intact  Cognition Overall Cognitive Status: Impaired/Different from baseline Arousal/Alertness: Awake/alert Attention: Sustained Sustained Attention: Appears intact Memory: Impaired Awareness: Impaired Awareness Impairment: Intellectual impairment Problem Solving: Impaired Problem Solving Impairment: Functional basic;Verbal basic Executive Function: Self Monitoring;Self Correcting Safety/Judgment: Impaired Sensation Sensation Light Touch: (BLEs) Coordination Gross Motor Movements are Fluid and Coordinated: Yes Heel Shin Test: WNL Motor  Motor Motor: Within Functional Limits  Mobility Bed Mobility Bed Mobility: Rolling Right;Rolling Left;Supine to  Sit;Sit to Supine Rolling Right: Supervision/verbal cueing Rolling Left: Supervision/Verbal cueing Supine to Sit: Minimal Assistance - Patient > 75% Sit to Supine: Minimal Assistance - Patient > 75% Transfers Transfers: Sit to Stand;Stand to Sit;Stand Pivot Transfers Sit to Stand: Contact Guard/Touching assist Stand to Sit: Contact Guard/Touching assist Stand Pivot Transfers: Contact Guard/Touching assist (min assist w/o AD) Transfer (Assistive device): Rolling walker Locomotion  Gait Ambulation: Yes Gait Assistance: Minimal Assistance - Patient > 75% Gait Distance (Feet): 50 Feet Assistive device: None Gait Assistance Details: Manual facilitation for weight shifting Gait Assistance Details: min assist w/o AD 50', CGA w/ RW 150'  Gait Gait: Yes Gait Pattern: Impaired Gait Pattern: Shuffle;Decreased step length - left;Decreased step length - right;Trunk flexed;Poor foot clearance - right Gait velocity: decreased, he states he does walk slow at baseline Stairs / Additional Locomotion Stairs: Yes Stairs Assistance: Contact Guard/Touching assist Stair Management Technique: One rail Right Number of Stairs: 4 Height of Stairs: 6 Wheelchair Mobility Wheelchair Mobility: No  Trunk/Postural Assessment  Cervical Assessment Cervical Assessment: Within Functional Limits Thoracic Assessment Thoracic Assessment: Exceptions to WFL(rounded shoulders) Lumbar Assessment Lumbar Assessment: Exceptions to WFL(posterior pelvic tilt) Postural Control Postural Control: Within Functional Limits  Balance Balance Balance Assessed: Yes Static Sitting Balance Static Sitting - Balance Support: Feet supported;No upper extremity supported Static Sitting - Level of Assistance: 5: Stand by assistance Dynamic Sitting Balance Dynamic Sitting - Balance Support: No upper extremity supported;Feet supported Dynamic Sitting - Level of Assistance: 5: Stand by assistance Static Standing Balance Static  Standing - Balance Support: No upper extremity supported;During functional activity Static Standing - Level of Assistance: 5: Stand by assistance Dynamic Standing Balance Dynamic Standing - Balance Support: No upper extremity supported;During functional activity Dynamic Standing - Level of Assistance: 4: Min assist Extremity Assessment  RLE Assessment RLE Assessment: Exceptions to [facility] Passive Range of Motion (PROM) Comments: [facility] General Strength Comments: hip musculature 4/5, otherwise 5/5 LLE Assessment LLE Assessment: Exceptions to [facility] Passive Range of Motion (  PROM) Comments: [facility] General Strength Comments: hip musculature 4/5, otherwise 5/5    Refer to Care Plan for Long Term Goals  Recommendations for other services: None   Discharge Criteria: Patient will be discharged from PT if patient refuses treatment 3 consecutive times without medical reason, if treatment goals not met, if there is a change in medical status, if patient makes no progress towards goals or if patient is discharged from hospital.  The above assessment, treatment plan, treatment alternatives and goals were discussed and mutually agreed upon: by patient and by family  Herbert Marken K Kimala Horne 03/03/2019, 10:02 AM

## 2019-03-03 NOTE — Progress Notes (Signed)
Patient has home unit at bedside.  RT assistance not needed at this time.

## 2019-03-03 NOTE — Progress Notes (Signed)
Lower extremity venous has been completed.   Preliminary results in CV Proc.   Abram Sander 03/03/2019 3:10 PM

## 2019-03-03 NOTE — Evaluation (Signed)
Speech Language Pathology Assessment and Plan  Patient Details  Name: Preston Hill MRN: [id number] Date of Birth: [birth year]  SLP Diagnosis: Cognitive Impairments;Speech and Language deficits  Rehab Potential: Good ELOS: 7-10 days     Today's Date: 03/03/2019 SLP Individual Time: 0300-9233 SLP Individual Time Calculation (min): 59 min   Problem List:  Patient Active Problem List   Diagnosis Date Noted  . Hypoxia   . Seizures ([place])   . Benign prostatic hyperplasia   . Benign essential HTN   . New onset atrial fibrillation ([place])   . Hypoalbuminemia due to protein-calorie malnutrition ([place])   . FUO (fever of unknown origin)   . Acute blood loss anemia   . Labile blood glucose   . SDH (subdural hematoma) ([place]) 03/02/2019  . Hypokalemia   . Hyperglycemia   . Atrial fibrillation with rapid ventricular response ([place])   . Subdural hematoma ([place]) 02/21/2019  . Personal history of other malignant neoplasm of skin 05/20/2018  . Bilateral leg edema 05/14/2018  . Injury of tendon of long head of left biceps 03/31/2018  . Tremor 11/06/2017  . First degree AV block 07/09/2017  . Loose body of right knee 07/03/2017  . Primary osteoarthritis of right knee 07/03/2017  . Bradycardia 06/20/2017  . Primary osteoarthritis of left knee 03/22/2017  . Encounter for screening colonoscopy 12/20/2015  . OSA (obstructive sleep apnea) 09/23/2014  . Abnormal ECG 02/26/2014  . Essential hypertension 02/26/2014  . Hyperlipidemia, mixed 02/26/2014   Past Medical History:  Past Medical History:  Diagnosis Date  . Arthritis   . Chronic kidney disease   . Enlarged prostate   . Hypertension   . Occasional tremors   . Peripheral edema   . Sleep apnea    OSA---USE C-PAP  . Subdural hematoma ([place]) 02/21/2019  . Tinnitus of both ears    Past Surgical History:  Past Surgical History:  Procedure Laterality Date  . BURR HOLE Left 02/21/2019   Procedure: BURR HOLES, Placement of Subdural drain;   Surgeon: Earnie Larsson, MD;  Location: [facility];  Service: Neurosurgery;  Laterality: Left;  . COLONOSCOPY  3817/2006  . COLONOSCOPY WITH PROPOFOL N/A 02/01/2016   Procedure: COLONOSCOPY WITH PROPOFOL;  Surgeon: Robert Bellow, MD;  Location[facility] ENDOSCOPY;  Service: Endoscopy;  Laterality: N/A;  . EYE SURGERY Right    Cataract Extraction with IOL  . HERNIA REPAIR Left 1964   Inguinal Hernia Repair  . KNEE ARTHROSCOPY Left   . KNEE ARTHROSCOPY Right 06/26/2017   Procedure: ARTHROSCOPY KNEE WITH DEBRIDEMENT AND LOOSE BODY REMOVAL;  Surgeon: Corky Mull, MD;  Location: [facility];  Service: Orthopedics;  Laterality: Right;  . KNEE ARTHROSCOPY WITH MEDIAL MENISECTOMY Left 05/02/2017   Procedure: KNEE ARTHROSCOPY WITH MEDIAL MENISECTOMY;  Surgeon: Corky Mull, MD;  Location: [facility] ORS;  Service: Orthopedics;  Laterality: Left;  . KNEE ARTHROSCOPY WITH MENISCAL REPAIR  05/02/2017   Procedure: KNEE ARTHROSCOPY WITH MENISCAL REPAIR;  Surgeon: Corky Mull, MD;  Location: [facility] ORS;  Service: Orthopedics;;  . SEPTOPLASTY  2003  . uvul  2004   repair    Assessment / Plan / Recommendation Clinical Impression Preston Hill is an 85 year old male with history of OSA, HTN, BPH, chronic tinnitus who was admitted on 02/21/19 with 2-3 day history of HA progressing to right sided weakness and difficulty walking. He was found to have acute on chronic large left SDH with left to right shift and effacement of frontal  and parietal lobes. He was on low dose ASA and denies any falls or recent trauma. He was taken to OR the same day for burr hole evacuation with placement of subdural drain by Dr. Annette Stable. Post noted started on dilantin and Keppra due to abnormal lip smacking movement. He had recurrent seizures--generalized motor activity with foaming and decline in MS and ongoing LUE motor activity. Keppra increased to 1000 mg bid. Follow up CT head showed interval evacuation of L-SDH with decrease in mass effect.  Lethargy has resolved and patient noted to have residual right visual field deficits with delayed processing and balance deficits. Follow up labs reveal persistent hypokalemia as well as ABLA. He continues to have tachycardia with HR up to 120's and new onset A fib. Cardiology consulted for input and he was started on IV cardizem--transitioned to low dose metoprolol today with recommendations for close monitoring due to hx of 1 degree AVB. Eliquis recommended when cleared by NS and to follow up with Dr. Lovena Le past discharge. Therapy ongoingand patient continues to have balance deficits with shuffling gait and hypoxia with activity--requires3L per [place].CIR recommended due to functional deficits.Pt is to be [facility] on[facility]01[facility].  Pt presents with overall mild-moderate delayed processing, impacting recall, semi-complex problem solving, emergent awareness, following multistep commands and word finding skills during higher level tasks. Given formal cognitive linguistic assessment, Cognistat, pt scored WFL on all areas except, severe impairment in short term recall, moderate impairment in construction task and mild impairment in calculations and similarities. Pt presents with mild-moderate language impairment resulting in slightly limited verbal output, word finding at sentence level and increased difficulty in structured language task such as divergent/convergent naming. Pt would benefit from skilled ST services in order to maximize functional independence and reduce burden of care, likely requiring supervision and continue ST services.   Skilled Therapeutic Interventions          Skilled ST services focused on cognitive skills. SLP administered  cognitive linguistic assessment, educated pt on results and created plan to address deficits. SLP facilitated semi-complex problem solving skills in ALFA medication task, pt required min A verbal cues. All questions were answered to satisfaction. Pt was left  in room with call bell within reach and bed/chair alarm set. ST recommends to continue skilled ST services.   SLP Assessment  Patient will need skilled Speech Lanaguage Pathology Services during CIR admission    Recommendations  SLP Diet Recommendations: Thin Liquid Administration via: Straw;Cup Medication Administration: Whole meds with puree Supervision: Patient able to self feed Compensations: Small sips/bites;Slow rate Postural Changes and/or Swallow Maneuvers: Seated upright 90 degrees;Upright 30-60 min after meal Oral Care Recommendations: Oral care BID Patient destination: Home Follow up Recommendations: Home Health SLP;24 hour supervision/assistance Equipment Recommended: None recommended by SLP    SLP Frequency 3 to 5 out of 7 days   SLP Duration  SLP Intensity  SLP Treatment/Interventions 7-10 days   Minumum of 1-2 x/day, 30 to 90 minutes  Cognitive remediation/compensation;Cueing hierarchy;Speech/Language facilitation;Functional tasks;Medication managment    Pain Pain Assessment Pain Score: 0-No pain  Prior Functioning Cognitive/Linguistic Baseline: Information not available Type of Home: House  Lives With: Spouse Available Help at Discharge: Family;Available 24 hours/day Education: 12th Vocation: Retired  Industrial/product designer Term Goals: Week 1: SLP Short Term Goal 1 (Week 1): Pt will utilize external memory aids and compensatory strategies to recall new, daily information with mod A verbal cues. SLP Short Term Goal 2 (Week 1): Pt will complete semi-complex functional problem solving tasks  with min A verbal cues. SLP Short Term Goal 3 (Week 1): Pt will utilize word finding strategies at sentence level with supervision A verbal cues. SLP Short Term Goal 4 (Week 1): Pt will self-monitor and self-correct functional errors with min A verbal cues during problem solving tasks.  Refer to Care Plan for Long Term Goals  Recommendations for other services: None   Discharge  Criteria: Patient will be discharged from SLP if patient refuses treatment 3 consecutive times without medical reason, if treatment goals not met, if there is a change in medical status, if patient makes no progress towards goals or if patient is discharged from hospital.  The above assessment, treatment plan, treatment alternatives and goals were discussed and mutually agreed upon: by patient  Emmette Katt  [facility] 03/03/2019, 4:49 PM

## 2019-03-03 NOTE — Discharge Summary (Addendum)
Physician Discharge Summary  Patient ID: Preston Hill MRN: [id number] DOB/AGE: 05-29-1934 85 y.o.  Admit date: 02/21/2019 Discharge date: 03/03/2019  Admission Diagnoses:  Discharge Diagnoses:  Active Problems:   Subdural hematoma (HCC)   Hypokalemia   Hyperglycemia   Atrial fibrillation with rapid ventricular response [facility])   Discharged Condition: good  Hospital Course: Patient admitted to the hospital for emergent treatment of a large chronic subdural hematoma.  Patient underwent bur hole evacuation of subdural.  His initial postoperative course was complicated by to generalized seizures.  The patient was started on to drug antiepileptic therapy.  His seizures resolved.  He returned to his baseline neurologic function.  He is currently awake and alert.  He is participating in therapies.  He did have a few episodes of atrial fibrillation.  Cardiology is aware.  No treatment of this for now.  Consults:   Significant Diagnostic Studies:   Treatments:   Discharge Exam: Blood pressure (!) 153/99, pulse 80, temperature (!) 101.4 F (38.6 C), temperature source Oral, resp. rate 18, height 5\' 6"  (1.676 m), weight 80.6 kg, SpO2 98 %. Awake and alert.  Oriented and appropriate.  Wound clean and dry.  Chest and abdomen benign.  Motor and sensory function intact.  Gait steady.  Disposition:    Allergies as of 03/02/2019      Reactions   Codeine Anxiety   Sick      Medication List    ASK your doctor about these medications   acetaminophen 500 MG tablet Commonly known as:  TYLENOL Take 1,000 mg by mouth every 6 (six) hours as needed (for pain.).   amLODipine 5 MG tablet Commonly known as:  NORVASC Take 10 mg by mouth daily.   aspirin EC 81 MG tablet Take 81 mg by mouth daily.   Glucosamine Sulfate 1000 MG Caps Take 2,000 mg by mouth daily.   ibuprofen 200 MG tablet Commonly known as:  ADVIL Take 400 mg by mouth every 8 (eight) hours as needed (for pain.).   lisinopril  20 MG tablet Commonly known as:  ZESTRIL Take 20 mg by mouth daily.   potassium chloride SA 20 MEQ tablet Commonly known as:  K-DUR Take 20 mEq by mouth 2 (two) times daily.   propranolol 40 MG tablet Commonly known as:  INDERAL Take 40 mg by mouth 2 (two) times daily.   tamsulosin 0.4 MG Caps capsule Commonly known as:  FLOMAX Take 0.8 mg by mouth at bedtime.   vitamin E 400 UNIT capsule Generic drug:  vitamin E Take 400 Units by mouth daily.        Signed: Cooper Render Sammie Schermerhorn 03/03/2019, 10:43 AM

## 2019-03-03 NOTE — Progress Notes (Signed)
[place] PHYSICAL MEDICINE & REHABILITATION PROGRESS NOTE  Subjective/Complaints: Patient seen sitting up at the edge of his bed this morning eating breakfast.  Good sitting balance.  He states he did not sleep well overnight, but cannot identify reason.  He was noted to have a fever overnight.  He states he is doing to begin therapies. Discussed with PA.  ROS: Denies CP, shortness of breath, nausea, vomiting, diarrhea, fevers, chills.  Objective: Vital Signs: Blood pressure 138/83, pulse 76, temperature 98.8 F (37.1 C), temperature source Oral, resp. rate 18, height 5\' 6"  (1.676 m), weight 82.5 kg, SpO2 91 %. Dg Chest 2 View  Result Date: 03/02/2019 CLINICAL DATA:  Hypoxia.  Cough. EXAM: CHEST - 2 VIEW COMPARISON:  Radiographs of Feb 17, 2010. FINDINGS: Stable cardiomegaly. No pneumothorax is noted. Minimal bibasilar subsegmental atelectasis is noted with minimal pleural effusions. Bony thorax is unremarkable. IMPRESSION: Minimal bibasilar subsegmental atelectasis with minimal pleural effusions. Electronically Signed   By: Marijo Conception M.D.   On: 03/02/2019 19:54   Recent Labs    03/03/19 0637  WBC 6.9  HGB 11.5*  HCT 32.6*  PLT 155   Recent Labs    03/02/19 0540 03/03/19 0637  NA 135 137  K 3.2* 3.5  CL 95* 98  CO2 27 27  GLUCOSE 27* 109*  BUN 20 21  CREATININE 0.55* 0.81  CALCIUM 8.8* 8.5*    Physical Exam: BP 138/83 (BP Location: Right Arm)   Pulse 76   Temp 98.8 F (37.1 C) (Oral)   Resp 18   Ht 5\' 6"  (1.676 m)   Wt 82.5 kg   SpO2 91%   BMI 29.36 kg/m  Constitutional: No distress . Vital signs reviewed. HENT: Normocephalic.  Atraumatic. Eyes: EOMI. No discharge. Cardiovascular: No JVD. Respiratory: Normal effort. GI: Non-distended. Musc: No edema or tenderness in extremities. Neurological: He isalertand oriented to person, place, and time. Reasonable insight and awareness.  Speech clear.  Motor: RUE: Grossly 5/5 throughout Skin: Skin iswarm.  Noerythema.  Psychiatric: He has anormal mood and affect. Hisbehavior is normal.  Assessment/Plan: 1. Functional deficits secondary to left frontoparietal SDH which require 3+ hours per day of interdisciplinary therapy in a comprehensive inpatient rehab setting.  Physiatrist is providing close team supervision and 24 hour management of active medical problems listed below.  Physiatrist and rehab team continue to assess barriers to discharge/monitor patient progress toward functional and medical goals  Care Tool:  Bathing              Bathing assist       Upper Body Dressing/Undressing Upper body dressing        Upper body assist      Lower Body Dressing/Undressing Lower body dressing            Lower body assist       Toileting Toileting    Toileting assist       Transfers Chair/bed transfer  Transfers assist           Locomotion Ambulation   Ambulation assist              Walk 10 feet activity   Assist           Walk 50 feet activity   Assist           Walk 150 feet activity   Assist           Walk 10 feet on uneven surface  activity   Assist  Wheelchair     Assist               Wheelchair 50 feet with 2 turns activity    Assist            Wheelchair 150 feet activity     Assist            Medical Problem List and Plan: 1.Functional and mobility deficitssecondary to left fronto parietal SDH  Begin CIR evaluations  Notes reviewed- acute on chronic SDH, labs reviewed 2. Antithrombotics: -DVT/anticoagulation:Mechanical:Sequential compression devices, below kneeBilateral lower extremities  Dopplers ordered -antiplatelet therapy: Off ASA 3. Pain Management:Hydrocodone prn. 4. Mood:LCSW to follow for evaluation and support. -antipsychotic agents: N/A 5. Neuropsych: This patientiscapable of making decisions onhisown  behalf. 6. Skin/Wound Care:Monitor incision for healing. 7. Fluids/Electrolytes/Nutrition:Monitor I/Os 8. New onset seizures/Status epilepticus?: On Keppra 1000 mg bid and Dilantin 100 mg tid.   Last phenytoin level low on 6/1 9. OSA: Encourage compliance with CPAP 10. BPH: Resumed Flomax --was on 0.8 mg/HS.  PVRs ordered 11. ?CKD: Improved with IVF for hydration. Has history of peripheral edema--d/c IVF to avoid overload.  Cr 0.81 on 6/2 13. HTN: Monitor BP tid--continue to hold Norvasc, Lisinopril and propranolol (question for tremors).  Monitor with increased mobility 14. Hypokalemia:Added K dur.  Potassium 3.5 on 6/2  Cont to monitor 15. New onset A fib: Monitor HR bid. Continue metoprolol 12.5 mg bid 16. Hypoxia: Encourage IS.   Wean supplemental oxygen as tolerated.   CXR reviewed, unremarkable for infection 17. Hypoalbuminemia  Supplement initiated on 6/2 18. Fever overnight  Started on IV Vanc  Blood cultures pending  UA likely neg, Ucx pending  CXR unremarkable for infection 19. ABLA  Hb 11.5 on 6/2  Cont to monitor 20. Labile blood glucose  ?Hypoglycemia of 27 yesterday  Will consider HbA1c with next labs  LOS: 1 days A FACE TO FACE EVALUATION WAS PERFORMED  Ankit Lorie Phenix 03/03/2019, 8:37 AM

## 2019-03-03 NOTE — Progress Notes (Signed)
Patient information reviewed and entered into [facility] System by Becky Rosaland Shiffman, PPS coordinator. Information including medical coding, function ability, and quality indicators will be reviewed and updated through discharge.   

## 2019-03-04 ENCOUNTER — Inpatient Hospital Stay (HOSPITAL_COMMUNITY): Payer: Medicare HMO

## 2019-03-04 ENCOUNTER — Inpatient Hospital Stay (HOSPITAL_COMMUNITY): Payer: Medicare HMO | Admitting: Physical Therapy

## 2019-03-04 DIAGNOSIS — R0989 Other specified symptoms and signs involving the circulatory and respiratory systems: Secondary | ICD-10-CM

## 2019-03-04 LAB — URINE CULTURE: Culture: 40000 — AB

## 2019-03-04 LAB — MAGNESIUM: Magnesium: 1.8 mg/dL (ref 1.7–2.4)

## 2019-03-04 NOTE — Progress Notes (Signed)
Patient Has been placing himself on and off CPAP will call if any issues arise

## 2019-03-04 NOTE — Progress Notes (Signed)
Physical Therapy Session Note  Patient Details  Name: ROMEY MATHIESON MRN: [id number] Date of Birth: Oct 05, 1933  Today's Date: 03/04/2019 PT Individual Time: 1005-1100 AND 1405-1430 PT Individual Time Calculation (min): 55 min AND 25 min  Short Term Goals: Week 1:  PT Short Term Goal 1 (Week 1): =LTGs due to ELOS  Skilled Therapeutic Interventions/Progress Updates:   Session 1: Pt in recliner and agreeable to therapy, no c/o pain. Stand pivot w/ min assist to w/c and total assist w/c transport to/from therapy gym. Session focused on functional endurance and decreasing supplemental O2 requirement. O2>98% at rest on 3 L/min. Ambulated in multiple bouts, see below for details. Pt fatigues quickly on 3 L/min, returned to 4 L/min reminder of session. Moderate increase in work of breathing immediately w/ all mobility, even static stance. Verbal reminders for breathing strategies and discussed energy conservation techniques. Stood in multiple 3-4 min bouts to perform pipe tree, 4 L/min O2. Returned to room total assist in w/c at one point to use bathroom, CGA toilet transfer while pt managed LE garments and pericare w/o physical assist from therapist. Returned to gym to complete pipe tree from seated level 2/2 fatigue. Occasional verbal/visual cues for problem solving. Ended session in recliner, all needs in reach.   100' w/ RW  - 3 L/min, >93%, down to 77% at last bit of walk, returned to >95 within a few seconds of sitting down   50' w/ RW - 3 L/min, down to 85% within 20', turned back up to 4 L/min and returned to >96% remainder of walk  Session 2:  Pt in recliner and agreeable to therapy, no c/o pain. Session focused on functional endurance and strengthening. Ambulated 100' x2 w/ CGA using RW on 4 L/min, mild increase in work of breathing. 5x sit<>stands to RW, 49.7 sec. Explained to pt significance of results in light of endurance related impairments and supplemental O2 requirements. Continues to  need prolonged seated rest break in between activities. Ended session in recliner, all needs in reach.   Therapy Documentation Precautions:  Precautions Precautions: Fall Precaution Comments: watch HR, 02 Restrictions Weight Bearing Restrictions: No  Therapy/Group: Individual Therapy  Zayana Salvador K Breeanna Galgano 03/04/2019, 10:02 AM

## 2019-03-04 NOTE — Progress Notes (Signed)
[place] PHYSICAL MEDICINE & REHABILITATION PROGRESS NOTE  Subjective/Complaints: Patient seen sitting up in a chair this morning.  He states he slept well overnight.  He states that a good first day of therapies yesterday.  ROS: Denies CP, shortness of breath, nausea, vomiting, diarrhea, fevers, chills.  Objective: Vital Signs: Blood pressure (!) 129/99, pulse 79, temperature 99.1 F (37.3 C), temperature source Oral, resp. rate 16, height 5\' 6"  (1.676 m), weight 82.5 kg, SpO2 99 %. Dg Chest 2 View  Result Date: 03/02/2019 CLINICAL DATA:  Hypoxia.  Cough. EXAM: CHEST - 2 VIEW COMPARISON:  Radiographs of Feb 17, 2010. FINDINGS: Stable cardiomegaly. No pneumothorax is noted. Minimal bibasilar subsegmental atelectasis is noted with minimal pleural effusions. Bony thorax is unremarkable. IMPRESSION: Minimal bibasilar subsegmental atelectasis with minimal pleural effusions. Electronically Signed   By: Marijo Conception M.D.   On: 03/02/2019 19:54   Vas [place] Lower Extremity Venous (dvt)  Result Date: 03/03/2019  Lower Venous Study Indications: Edema.  Performing Technologist: Abram Sander RVS  Examination Guidelines: A complete evaluation includes B-mode imaging, spectral Doppler, color Doppler, and power Doppler as needed of all accessible portions of each vessel. Bilateral testing is considered an integral part of a complete examination. Limited examinations for reoccurring indications may be performed as noted.  +---------+---------------+---------+-----------+----------+--------------+ RIGHT    CompressibilityPhasicitySpontaneityPropertiesSummary        +---------+---------------+---------+-----------+----------+--------------+ CFV      Full           Yes      Yes                                 +---------+---------------+---------+-----------+----------+--------------+ SFJ      Full                                                         +---------+---------------+---------+-----------+----------+--------------+ FV Prox  Full                                                        +---------+---------------+---------+-----------+----------+--------------+ FV Mid   Full                                                        +---------+---------------+---------+-----------+----------+--------------+ FV DistalFull                                                        +---------+---------------+---------+-----------+----------+--------------+ PFV      Full                                                        +---------+---------------+---------+-----------+----------+--------------+  POP      Full           Yes      Yes                                 +---------+---------------+---------+-----------+----------+--------------+ PTV      Full                                                        +---------+---------------+---------+-----------+----------+--------------+ PERO                                                  Not visualized +---------+---------------+---------+-----------+----------+--------------+   +---------+---------------+---------+-----------+----------+--------------+ LEFT     CompressibilityPhasicitySpontaneityPropertiesSummary        +---------+---------------+---------+-----------+----------+--------------+ CFV      Full           Yes      Yes                                 +---------+---------------+---------+-----------+----------+--------------+ SFJ      Full                                                        +---------+---------------+---------+-----------+----------+--------------+ FV Prox  Full                                                        +---------+---------------+---------+-----------+----------+--------------+ FV Mid   Full                                                         +---------+---------------+---------+-----------+----------+--------------+ FV DistalFull                                                        +---------+---------------+---------+-----------+----------+--------------+ PFV      Full                                                        +---------+---------------+---------+-----------+----------+--------------+ POP      Full           Yes      Yes                                 +---------+---------------+---------+-----------+----------+--------------+  PTV      Full                                                        +---------+---------------+---------+-----------+----------+--------------+ PERO                                                  Not visualized +---------+---------------+---------+-----------+----------+--------------+     Summary: Right: There is no evidence of deep vein thrombosis in the lower extremity. No cystic structure found in the popliteal fossa. Left: There is no evidence of deep vein thrombosis in the lower extremity. No cystic structure found in the popliteal fossa.  *See table(s) above for measurements and observations. Electronically signed by Deitra Mayo MD on 03/03/2019 at 4:52:19 PM.    Final    Recent Labs    03/03/19 0637  WBC 6.9  HGB 11.5*  HCT 32.6*  PLT 155   Recent Labs    03/02/19 0540 03/03/19 0637  NA 135 137  K 3.2* 3.5  CL 95* 98  CO2 27 27  GLUCOSE 27* 109*  BUN 20 21  CREATININE 0.55* 0.81  CALCIUM 8.8* 8.5*    Physical Exam: BP (!) 129/99   Pulse 79   Temp 99.1 F (37.3 C) (Oral)   Resp 16   Ht 5\' 6"  (1.676 m)   Wt 82.5 kg   SpO2 99%   BMI 29.36 kg/m  Constitutional: No distress . Vital signs reviewed. HENT: Normocephalic.  Atraumatic. Eyes: EOMI. No discharge. Cardiovascular: No JVD. Respiratory: Normal effort.  + [place]. GI: Non-distended. Musc: No edema or tenderness in extremities. Neurological: He isalertand  oriented. Reasonable insight and awareness.  Speech clear.  Motor: RUE: Grossly 5/5 throughout, unchanged Skin: Skin iswarm. Noerythema.  Psychiatric: He has anormal mood and affect. Hisbehavior is normal.  Assessment/Plan: 1. 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  Physiatrist is providing close team supervision and 24 hour management of active medical problems listed below.  Physiatrist and rehab team continue to assess barriers to discharge/monitor patient progress toward functional and medical goals  Care Tool:  Bathing    Body parts bathed by patient: Right arm, Left arm, Chest, Abdomen, Front perineal area, Right upper leg, Left upper leg, Face   Body parts bathed by helper: Left lower leg, Right lower leg, Buttocks     Bathing assist Assist Level: Minimal Assistance - Patient > 75%     Upper Body Dressing/Undressing Upper body dressing   What is the patient wearing?: Pull over shirt    Upper body assist Assist Level: Contact Guard/Touching assist    Lower Body Dressing/Undressing Lower body dressing      What is the patient wearing?: Incontinence brief, Pants     Lower body assist Assist for lower body dressing: Maximal Assistance - Patient 25 - 49%     Toileting Toileting    Toileting assist Assist for toileting: Dependent - Patient 0%     Transfers Chair/bed transfer  Transfers assist     Chair/bed transfer assist level: Minimal Assistance - Patient > 75%  Locomotion Ambulation   Ambulation assist      Assist level: Independent Assistive device: Walker-rolling Max distance: 50'   Walk 10 feet activity   Assist     Assist level: Minimal Assistance - Patient > 75% Assistive device: No Device   Walk 50 feet activity   Assist    Assist level: Minimal Assistance - Patient  > 75% Assistive device: No Device    Walk 150 feet activity   Assist Walk 150 feet activity did not occur: Safety/medical concerns         Walk 10 feet on uneven surface  activity   Assist Walk 10 feet on uneven surfaces activity did not occur: Safety/medical concerns         Wheelchair     Assist Will patient use wheelchair at discharge?: No   Wheelchair activity did not occur: N/A         Wheelchair 50 feet with 2 turns activity    Assist    Wheelchair 50 feet with 2 turns activity did not occur: N/A       Wheelchair 150 feet activity     Assist Wheelchair 150 feet activity did not occur: N/A          Medical Problem List and Plan: 1.Functional and mobility deficitssecondary to left acute on chronic fronto parietal SDH  Continue CIR  Team conference today to discuss current and goals and coordination of care, home and environmental barriers, and discharge planning with nursing, case manager, and therapies.  2. Antithrombotics: -DVT/anticoagulation:Mechanical:Sequential compression devices, below kneeBilateral lower extremities  Dopplers negative for DVT -antiplatelet therapy: Off ASA 3. Pain Management:Hydrocodone prn. 4. Mood:LCSW to follow for evaluation and support. -antipsychotic agents: N/A 5. Neuropsych: This patientiscapable of making decisions onhisown behalf. 6. Skin/Wound Care:Monitor incision for healing. 7. Fluids/Electrolytes/Nutrition:Monitor I/Os 8. New onset seizures/Status epilepticus?: On Keppra 1000 mg bid and Dilantin 100 mg tid.   Last phenytoin level low on 6/1 9. OSA: Encourage compliance with CPAP 10. BPH: Resumed Flomax --was on 0.8 mg/HS.  PVRs ordered, pending  11. ?CKD: Improved with IVF for hydration. Has history of peripheral edema--d/c IVF to avoid overload.  Cr 0.81 on 6/2 13. HTN: Monitor BP tid--continue to hold Norvasc, Lisinopril and propranolol  (question for tremors).  Slightly labile on 6/3  Monitor with increased mobility 14. Hypokalemia:Added K dur.  Potassium 3.5 on 6/2  Cont to monitor 15. New onset A fib: Monitor HR bid. Continue metoprolol 12.5 mg bid 16. Hypoxia: Encourage IS.   Wean supplemental oxygen as tolerated.   CXR reviewed, unremarkable for infection 17. Hypoalbuminemia  Supplement initiated on 6/2 18. Fever: Afebrile x36 hours  Started on IV Vanc, DC'd  Blood cultures no growth today  UA likely neg, Ucx less than 40,000  CXR unremarkable for infection 19. ABLA  Hb 11.5 on 6/2  Cont to monitor 20. Labile blood glucose  Will consider HbA1c with next labs 21.  Hypomagnesemia  Magnesium 1.8 on 6/3 after IV supplementation on 6/2  Will continue to monitor  LOS: 2 days A FACE TO FACE EVALUATION WAS PERFORMED   Lorie Phenix 03/04/2019, 9:48 AM

## 2019-03-04 NOTE — Progress Notes (Signed)
Speech Language Pathology Daily Session Note  Patient Details  Name: Preston Hill MRN: [id number] Date of Birth: [birth year]  Today's Date: 03/04/2019 SLP Individual Time: 1300-1330 SLP Individual Time Calculation (min): 30 min  Short Term Goals: Week 1: SLP Short Term Goal 1 (Week 1): Pt will utilize external memory aids and compensatory strategies to recall new, daily information with mod A verbal cues. SLP Short Term Goal 2 (Week 1): Pt will complete semi-complex functional problem solving tasks with min A verbal cues. SLP Short Term Goal 3 (Week 1): Pt will utilize word finding strategies at sentence level with supervision A verbal cues. SLP Short Term Goal 4 (Week 1): Pt will self-monitor and self-correct functional errors with min A verbal cues during problem solving tasks.  Skilled Therapeutic Interventions: Skilled ST services focused on cognitive skills. SLP facilitated semi-complex problem solving and emergent awareness skills utilizing calendar task, pt required initial max A verbal cues fading to mod A verbal cues and Mod A verbal cues for error awareness. Pt initial did not add adqueate information to calendar and required extra time, suggest due to memory deficits and delayed processing. SLP recommends completing calendar task in next session. Pt was left in room with call bell within reach and chair alarm set. ST recommends to continue skilled ST services.      Pain Pain Assessment Pain Score: 0-No pain  Therapy/Group: Individual Therapy  Kirstina Leinweber  [facility] 03/04/2019, 4:07 PM

## 2019-03-04 NOTE — Plan of Care (Signed)
Min assist adls. Oob to br without waiting for assistance x 2. Call bell at side. Alarms armed and operational. Advised pt to reamin seated for assist.

## 2019-03-04 NOTE — Progress Notes (Signed)
Occupational Therapy Session Note  Patient Details  Name: Preston Hill MRN: [id number] Date of Birth: Aug 30, 1934  Today's Date: 03/04/2019 OT Individual Time: 7225-7505 OT Individual Time Calculation (min): 70 min    Short Term Goals: Week 1:  OT Short Term Goal 1 (Week 1): STG=LTG 2/2 ELOS  Skilled Therapeutic Interventions/Progress Updates:    Pt resting in recliner upon arrival.  2L O2 with sats at 95%. Pt declined bathing/dressing this morning.  OT intervention with focus on practicing tub transfers with/without tub bench, standing balance, activity tolerance, discharge planning, and safety awareness to increase independence with BADLs. Pt practiced stepping into tub with min A.  Pt with difficulty lifting RLE to appropriate height but able to complete.  Pt stated he was having trouble PTA 2/2 R hip. Recommended tub transfer bench at discharge.  O2 after tub transfers on 2L 78%.  O2 increased to 4L and pt recovered after 2 mins.  Pt engaged in standing activities with 2L O2 and O2 maintainted >90%. Pt transitioned to NuStep; 5 mins X 2 on level 3. 1) 2L O2 at 72% 2) 4L O2 at 94%. Pt returned to room and amb with RW from doorway to recliner (CGA). Pt remained in recliner with all needs within reach and chair alarm activated.   Therapy Documentation Precautions:  Precautions Precautions: Fall Precaution Comments: watch HR, 02 Restrictions Weight Bearing Restrictions: No  Pain:  Pt denies pain this morning   Therapy/Group: Individual Therapy  Leroy Libman 03/04/2019, 12:15 PM

## 2019-03-05 ENCOUNTER — Inpatient Hospital Stay (HOSPITAL_COMMUNITY): Payer: Medicare HMO | Admitting: Physical Therapy

## 2019-03-05 ENCOUNTER — Inpatient Hospital Stay (HOSPITAL_COMMUNITY): Payer: Medicare HMO | Admitting: Occupational Therapy

## 2019-03-05 ENCOUNTER — Inpatient Hospital Stay (HOSPITAL_COMMUNITY): Payer: Medicare HMO | Admitting: Speech Pathology

## 2019-03-05 DIAGNOSIS — Z9981 Dependence on supplemental oxygen: Secondary | ICD-10-CM

## 2019-03-05 MED ORDER — METOPROLOL TARTRATE 25 MG PO TABS
25.0000 mg | ORAL_TABLET | Freq: Two times a day (BID) | ORAL | Status: DC
  Administered 2019-03-05 – 2019-03-06 (×2): 25 mg via ORAL
  Filled 2019-03-05 (×2): qty 1

## 2019-03-05 NOTE — Progress Notes (Signed)
Occupational Therapy Session Note  Patient Details  Name: Preston Hill MRN: [id number] Date of Birth: Nov 20, 1933  Today's Date: 03/05/2019 OT Individual Time: 3780-1081 OT Individual Time Calculation (min): 70 min   Short Term Goals: Week 1:  OT Short Term Goal 1 (Week 1): STG=LTG 2/2 ELOS  Skilled Therapeutic Interventions/Progress Updates:    Pt greeted sitting in recliner after finishing lunch and agreeable to OT treatment session focused on self-care retraining. Pt on 3L of O2 with activity. Pt ambulated throughout session with RW and CGA. Toilet transfers CGA with successful void of bladder and small BM. Pt able to complete peri-care with min A. Pt needed cues for safety when transitioning into shower onto shower seat. Pt also had difficulty motor planning how to remove screw top from soap, but then was able to bathe with CGA for balance when washing buttocks. Pt needed min verbal cues to orient shirt and multiple trials to get R UE through sleeve. Educated on hemi LB dressing techniques with pt able to thread pant legs today. Sit<>stand at the sink with CGA. Worked on standing balance/endurance with standing shaving task. Pt desat to 85% in standing. Incorporated deep breathing strategies, but SpO2 only recovered to 87%. Pt placed on 4L for remainder of standing task with SpO2 at 94%. Pt tolerated standing for 2.5 minutes. Pt reported max fatigue and finished grooming tasks sitting in wc. Pt ambulated back to recliner at end of session and left with chair alarm on and needs met.   Therapy Documentation Precautions:  Precautions Precautions: Fall Precaution Comments: watch HR, 02 Restrictions Weight Bearing Restrictions: No Pain: Pain Assessment Pain Scale: 0-10 Pain Score: 0-No pain Faces Pain Scale: No hurt   Therapy/Group: Individual Therapy  Valma Cava 03/05/2019, 1:29 PM

## 2019-03-05 NOTE — Progress Notes (Signed)
Social Work  Social Work Assessment and Plan  Patient Details  Name: Preston Hill MRN: [id number] Date of Birth: 01/15/1934  Today's Date: 03/05/2019  Problem List:  Patient Active Problem List   Diagnosis Date Noted  . Supplemental oxygen dependent   . Hypomagnesemia   . Labile blood pressure   . Hypoxia   . Seizures ([place])   . Benign prostatic hyperplasia   . Benign essential HTN   . New onset atrial fibrillation ([place])   . Hypoalbuminemia due to protein-calorie malnutrition ([place])   . FUO (fever of unknown origin)   . Acute blood loss anemia   . Labile blood glucose   . SDH (subdural hematoma) ([employer]) 03/02/2019  . Hypokalemia   . Hyperglycemia   . Atrial fibrillation with rapid ventricular response ([place])   . Subdural hematoma ([place]) 02/21/2019  . Personal history of other malignant neoplasm of skin 05/20/2018  . Bilateral leg edema 05/14/2018  . Injury of tendon of long head of left biceps 03/31/2018  . Tremor 11/06/2017  . First degree AV block 07/09/2017  . Loose body of right knee 07/03/2017  . Primary osteoarthritis of right knee 07/03/2017  . Bradycardia 06/20/2017  . Primary osteoarthritis of left knee 03/22/2017  . Encounter for screening colonoscopy 12/20/2015  . OSA (obstructive sleep apnea) 09/23/2014  . Abnormal ECG 02/26/2014  . Essential hypertension 02/26/2014  . Hyperlipidemia, mixed 02/26/2014   Past Medical History:  Past Medical History:  Diagnosis Date  . Arthritis   . Chronic kidney disease   . Enlarged prostate   . Hypertension   . Occasional tremors   . Peripheral edema   . Sleep apnea    OSA---USE C-PAP  . Subdural hematoma ([place]) 02/21/2019  . Tinnitus of both ears    Past Surgical History:  Past Surgical History:  Procedure Laterality Date  . BURR HOLE Left 02/21/2019   Procedure: BURR HOLES, Placement of Subdural drain;  Surgeon: Earnie Larsson, MD;  Location: [facility];  Service: Neurosurgery;  Laterality: Left;  . COLONOSCOPY  3817/2006   . COLONOSCOPY WITH PROPOFOL N/A 02/01/2016   Procedure: COLONOSCOPY WITH PROPOFOL;  Surgeon: Robert Bellow, MD;  Location: [facility] ENDOSCOPY;  Service: Endoscopy;  Laterality: N/A;  . EYE SURGERY Right    Cataract Extraction with IOL  . HERNIA REPAIR Left 1964   Inguinal Hernia Repair  . KNEE ARTHROSCOPY Left   . KNEE ARTHROSCOPY Right 06/26/2017   Procedure: ARTHROSCOPY KNEE WITH DEBRIDEMENT AND LOOSE BODY REMOVAL;  Surgeon: Corky Mull, MD;  Location: [place];  Service: Orthopedics;  Laterality: Right;  . KNEE ARTHROSCOPY WITH MEDIAL MENISECTOMY Left 05/02/2017   Procedure: KNEE ARTHROSCOPY WITH MEDIAL MENISECTOMY;  Surgeon: Corky Mull, MD;  Location: [facility] ORS;  Service: Orthopedics;  Laterality: Left;  . KNEE ARTHROSCOPY WITH MENISCAL REPAIR  05/02/2017   Procedure: KNEE ARTHROSCOPY WITH MENISCAL REPAIR;  Surgeon: Corky Mull, MD;  Location: [facility] ORS;  Service: Orthopedics;;  . SEPTOPLASTY  2003  . uvul  2004   repair   Social History:  reports that he has never smoked. He has never used smokeless tobacco. He reports that he does not drink alcohol or use drugs.  Family / Support Systems Marital Status: Married Patient Roles: Spouse, Parent Spouse/Significant Other: wife, [name] @ [phone] or ([phone] Children: son, [name] (local) @ [phone]; son, [name]) Anticipated Caregiver: wife Ability/Limitations of Caregiver: Supervision Caregiver Availability: 24/7 Family Dynamics: Pt describes wife and sons  as very supportive;  Wife and son state their committment to providing any needed assistance.  Social History Preferred language: English Religion: Christian Reformed Cultural Background: NA Read: Yes Write: Yes Employment Status: Retired Public relations account executive Issues: None Guardian/Conservator: None - per MD, pt is capable of making decisions on his own behalf.   Abuse/Neglect Abuse/Neglect Assessment Can Be Completed:  Yes Physical Abuse: Denies Verbal Abuse: Denies Sexual Abuse: Denies Exploitation of patient/patient's resources: Denies Self-Neglect: Denies  Emotional Status Pt's affect, behavior and adjustment status: Pt with flat affect and appears very fatigued but is able to complete assessment without any difficulty.  Admits he is frustrated by his limitations and eager to get home.  Denies any significant emotional distress - will monitor. Recent Psychosocial Issues: None Psychiatric History: None Substance Abuse History: None  Patient / Family Perceptions, Expectations & Goals Pt/Family understanding of illness & functional limitations: Pt able to describe that he had bleeding into his brain and that he had "surgery to drain it off."  Pt and family all with good, general understanding of medical issues and of current functional limitations. Premorbid pt/family roles/activities: Pt was completely independent PTA Anticipated changes in roles/activities/participation: Per supervision goals, wife to assume primary support role. Pt/family expectations/goals: "I just want to feel better and get home."  [employer]: None Premorbid Home Care/DME Agencies: None Transportation available at discharge: yes Resource referrals recommended: Neuropsychology  Discharge Planning Living Arrangements: Spouse/significant other Support Systems: Spouse/significant other, Children Type of Residence: Private residence Insurance Resources: Medicare([employer] Medicare) Financial Resources: Preston Referred: No Living Expenses: Own Money Management: Spouse, Patient Does the patient have any problems obtaining your medications?: No Home Management: Pt and wife share responsibilities. Patient/Family Preliminary Plans: Pt to return home with spouse as primary support. Social Work Anticipated Follow Up Needs: HH/OP Expected length of stay: 7-10 days  Clinical  Impression Elderly, frail appearing gentleman here following SDH with burr-hole evac.  Team setting supervision goals and wife able to provide this at home.  Pt denies any significant emotional distress.  SW to follow for support and d/c planning needs.  Windy Dudek 03/05/2019, 3:26 PM

## 2019-03-05 NOTE — Progress Notes (Signed)
Speech Language Pathology Daily Session Note  Patient Details  Name: Preston Hill MRN: [id number] Date of Birth: 04-Jul-1934  Today's Date: 03/05/2019 SLP Individual Time: 1105-1205 SLP Individual Time Calculation (min): 60 min  Short Term Goals: Week 1: SLP Short Term Goal 1 (Week 1): Pt will utilize external memory aids and compensatory strategies to recall new, daily information with mod A verbal cues. SLP Short Term Goal 2 (Week 1): Pt will complete semi-complex functional problem solving tasks with min A verbal cues. SLP Short Term Goal 3 (Week 1): Pt will utilize word finding strategies at sentence level with supervision A verbal cues. SLP Short Term Goal 4 (Week 1): Pt will self-monitor and self-correct functional errors with min A verbal cues during problem solving tasks.  Skilled Therapeutic Interventions: Pt was seen for skilled ST intervention targeting goals for improved functional recall, problem solving, self monitoring/correction, and word retrieval. SLP facilitated session by providing calendar task initiated yesterday. Pt responded immediately with "I'm not going to do well on that". Max verbal and visual cues were provided for attention to detail with information written. Pt then stated "I don't think this is going to work out". Pt indicated the difficulty he is having with his handwriting is new. SLP focused on improving word retrieval by requesting description of an item in his room. SLP provided example, but he was unable to complete this task. SLP engaged pt in conversation about his family, including his wife of 32 years, whom he named without difficulty. Initially, pt stated he has 4 children - 3 sons and 1 daughter. Later he indicated only 2 sons, and no daughters. Pt was able to name his 2 sons and one daughter in law. He indicated he had 4 grandchildren, but was able to name only 2.  The difficulty with this task appeared to be distressing to pt, based on body language and  facial expressions. Pt was left in recliner with alarm on, all needs within reach. Continue ST per current plan of care.  Pain Pain Assessment Pain Scale: Faces Faces Pain Scale: Hurts a little bit Pain Type: Acute pain Pain Location: Back Pain Orientation: Right Pain Descriptors / Indicators: Aching Multiple Pain Sites: No  Therapy/Group: Individual Therapy   Raveen Wieseler B. Quentin Ore, [facility], [place] Speech Language Pathologist  Shonna Chock 03/05/2019, 1:54 PM

## 2019-03-05 NOTE — Progress Notes (Signed)
[facility] PHYSICAL MEDICINE & REHABILITATION PROGRESS NOTE  Subjective/Complaints: Patient seen sitting up in his chair this morning eating breakfast.  He states he did not sleep well overnight, but cannot elaborate on a reasons.  ROS: Denies CP, shortness of breath, nausea, vomiting, diarrhea, fevers, chills.  Objective: Vital Signs: Blood pressure (!) 146/93, pulse (!) 108, temperature 98.1 F (36.7 C), resp. rate 15, height 5\' 6"  (1.676 m), weight 82.5 kg, SpO2 90 %. Vas [place] Lower Extremity Venous (dvt)  Result Date: 03/03/2019  Lower Venous Study Indications: Edema.  Performing Technologist: Abram Sander RVS  Examination Guidelines: A complete evaluation includes B-mode imaging, spectral Doppler, color Doppler, and power Doppler as needed of all accessible portions of each vessel. Bilateral testing is considered an integral part of a complete examination. Limited examinations for reoccurring indications may be performed as noted.  +---------+---------------+---------+-----------+----------+--------------+ RIGHT    CompressibilityPhasicitySpontaneityPropertiesSummary        +---------+---------------+---------+-----------+----------+--------------+ CFV      Full           Yes      Yes                                 +---------+---------------+---------+-----------+----------+--------------+ SFJ      Full                                                        +---------+---------------+---------+-----------+----------+--------------+ FV Prox  Full                                                        +---------+---------------+---------+-----------+----------+--------------+ FV Mid   Full                                                        +---------+---------------+---------+-----------+----------+--------------+ FV DistalFull                                                        +---------+---------------+---------+-----------+----------+--------------+  PFV      Full                                                        +---------+---------------+---------+-----------+----------+--------------+ POP      Full           Yes      Yes                                 +---------+---------------+---------+-----------+----------+--------------+ PTV      Full                                                        +---------+---------------+---------+-----------+----------+--------------+  PERO                                                  Not visualized +---------+---------------+---------+-----------+----------+--------------+   +---------+---------------+---------+-----------+----------+--------------+ LEFT     CompressibilityPhasicitySpontaneityPropertiesSummary        +---------+---------------+---------+-----------+----------+--------------+ CFV      Full           Yes      Yes                                 +---------+---------------+---------+-----------+----------+--------------+ SFJ      Full                                                        +---------+---------------+---------+-----------+----------+--------------+ FV Prox  Full                                                        +---------+---------------+---------+-----------+----------+--------------+ FV Mid   Full                                                        +---------+---------------+---------+-----------+----------+--------------+ FV DistalFull                                                        +---------+---------------+---------+-----------+----------+--------------+ PFV      Full                                                        +---------+---------------+---------+-----------+----------+--------------+ POP      Full           Yes      Yes                                 +---------+---------------+---------+-----------+----------+--------------+ PTV      Full                                                         +---------+---------------+---------+-----------+----------+--------------+ PERO  Not visualized +---------+---------------+---------+-----------+----------+--------------+     Summary: Right: There is no evidence of deep vein thrombosis in the lower extremity. No cystic structure found in the popliteal fossa. Left: There is no evidence of deep vein thrombosis in the lower extremity. No cystic structure found in the popliteal fossa.  *See table(s) above for measurements and observations. Electronically signed by Deitra Mayo MD on 03/03/2019 at 4:52:19 PM.    Final    Recent Labs    03/03/19 0637  WBC 6.9  HGB 11.5*  HCT 32.6*  PLT 155   Recent Labs    03/03/19 0637  NA 137  K 3.5  CL 98  CO2 27  GLUCOSE 109*  BUN 21  CREATININE 0.81  CALCIUM 8.5*    Physical Exam: BP (!) 146/93 (BP Location: Left Arm)   Pulse (!) 108   Temp 98.1 F (36.7 C)   Resp 15   Ht 5\' 6"  (1.676 m)   Wt 82.5 kg   SpO2 90%   BMI 29.36 kg/m  Constitutional: No distress . Vital signs reviewed. HENT: Normocephalic.  Atraumatic. Eyes: EOMI.  No discharge. Cardiovascular: No JVD. Respiratory: Normal effort.  + [place]. GI: Non-distended. Musc: No edema or tenderness in extremities. Neurological: He isalert. Reasonable insight and awareness.  Speech clear.  Motor: RUE: Grossly 5/5 throughout, stable Skin: Skin iswarm. Noerythema.  Psychiatric: He has anormal mood and affect. Hisbehavior is normal.  Assessment/Plan: 1. 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  Physiatrist is providing close team supervision and 24 hour management of active medical problems listed below.  Physiatrist and rehab team continue to assess barriers to discharge/monitor patient progress  toward functional and medical goals  Care Tool:  Bathing    Body parts bathed by patient: Right arm, Left arm, Chest, Abdomen, Front perineal area, Right upper leg, Left upper leg, Face   Body parts bathed by helper: Left lower leg, Right lower leg, Buttocks     Bathing assist Assist Level: Minimal Assistance - Patient > 75%     Upper Body Dressing/Undressing Upper body dressing   What is the patient wearing?: Pull over shirt    Upper body assist Assist Level: Contact Guard/Touching assist    Lower Body Dressing/Undressing Lower body dressing      What is the patient wearing?: Incontinence brief, Pants     Lower body assist Assist for lower body dressing: Maximal Assistance - Patient 25 - 49%     Toileting Toileting    Toileting assist Assist for toileting: Moderate Assistance - Patient 50 - 74%     Transfers Chair/bed transfer  Transfers assist     Chair/bed transfer assist level: Minimal Assistance - Patient > 75%     Locomotion Ambulation   Ambulation assist      Assist level: Contact Guard/Touching assist Assistive device: Walker-rolling Max distance: 50'   Walk 10 feet activity   Assist     Assist level: Contact Guard/Touching assist Assistive device: Walker-rolling   Walk 50 feet activity   Assist    Assist level: Contact Guard/Touching assist Assistive device: Walker-rolling    Walk 150 feet activity   Assist Walk 150 feet activity did not occur: Safety/medical concerns         Walk 10 feet on uneven surface  activity   Assist Walk 10 feet on uneven surfaces activity did not occur: Safety/medical concerns  Wheelchair     Assist Will patient use wheelchair at discharge?: No   Wheelchair activity did not occur: N/A         Wheelchair 50 feet with 2 turns activity    Assist    Wheelchair 50 feet with 2 turns activity did not occur: N/A       Wheelchair 150 feet activity     Assist  Wheelchair 150 feet activity did not occur: N/A          Medical Problem List and Plan: 1.Functional and mobility deficitssecondary to left acute on chronic fronto parietal SDH  Continue CIR 2. Antithrombotics: -DVT/anticoagulation:Mechanical:Sequential compression devices, below kneeBilateral lower extremities  Dopplers negative for DVT -antiplatelet therapy: Off ASA 3. Pain Management:Hydrocodone prn. 4. Mood:LCSW to follow for evaluation and support. -antipsychotic agents: N/A 5. Neuropsych: This patientiscapable of making decisions onhisown behalf. 6. Skin/Wound Care:Monitor incision for healing. 7. Fluids/Electrolytes/Nutrition:Monitor I/Os 8. New onset seizures/Status epilepticus?: On Keppra 1000 mg bid and Dilantin 100 mg tid.   Last phenytoin level low on 6/1 9. OSA: Encourage compliance with CPAP 10. BPH: Resumed Flomax --was on 0.8 mg/HS.  PVRs unremarkable 11. ?CKD: Improved with IVF for hydration. Has history of peripheral edema--d/c IVF to avoid overload.  Cr 0.81 on 6/2 13. HTN: Monitor BP tid--continue to hold Norvasc, Lisinopril and propranolol (question for tremors).  Metoprolol increased to 25 twice daily on 6/4  Monitor with increased mobility 14. Hypokalemia:Added K dur.  Potassium 3.5 on 6/2, labs ordered for tomorrow  Cont to monitor 15. New onset A fib: Monitor HR bid.   metoprolol increased to 25 mg bid on 6/4 16. Hypoxia: Encourage IS.   Wean supplemental oxygen as tolerated.   CXR reviewed, unremarkable for infection 17. Hypoalbuminemia  Supplement initiated on 6/2 18. Fever: Resolved  Started on IV Vanc, DC'd  Blood cultures no growth to date  UA likely neg, Ucx less than 40,000  CXR unremarkable for infection 19. ABLA  Hb 11.5 on 6/2, labs ordered for tomorrow  Cont to monitor 20. Labile blood glucose  Hemoglobin A1c ordered for tomorrow 21.  Hypomagnesemia  Magnesium 1.8 on 6/3 after IV  supplementation on 6/2, labs ordered for tomorrow  Will continue to monitor  LOS: 3 days A FACE TO FACE EVALUATION WAS PERFORMED   Lorie Phenix 03/05/2019, 8:58 AM

## 2019-03-05 NOTE — Care Management (Signed)
Inpatient [facility] Statement of Services  Patient Name:  Preston Hill  Date:  03/05/2019  Welcome to the [facility].  Our goal is to provide you with an individualized program based on your diagnosis and situation, designed to meet your specific needs.  With this comprehensive rehabilitation program, you will be expected to participate in at least 3 hours of rehabilitation therapies Monday-Friday, with modified therapy programming on the weekends.  Your rehabilitation program will include the following services:  Physical Therapy (PT), Occupational Therapy (OT), Speech Therapy (ST), 24 hour per day rehabilitation nursing, Therapeutic Recreaction (TR), Neuropsychology, Case Management (Social Worker), Rehabilitation Medicine, Nutrition Services and Pharmacy Services  Weekly team conferences will be held on Wednesdays to discuss your progress.  Your Social Worker will talk with you frequently to get your input and to update you on team discussions.  Team conferences with you and your family in attendance may also be held.  Expected length of stay: 7-10 days   Overall anticipated outcome: supervision  Depending on your progress and recovery, your program may change. Your Social Worker will coordinate services and will keep you informed of any changes. Your Social Worker's name and contact numbers are listed  below.  The following services may also be recommended but are not provided by the [facility] will be made to provide these services after discharge if needed.  Arrangements include referral to agencies that provide these services.  Your insurance has been verified to be:  [employer] Your primary doctor is:  Benita Stabile  Pertinent information will be shared with your doctor and your insurance  company.  Social Worker:  [place], [place] or ([phone]   Information discussed with and copy given to patient by: Lennart Pall, 03/05/2019, 3:15 PM

## 2019-03-05 NOTE — Progress Notes (Addendum)
Physical Therapy Session Note  Patient Details  Name: Preston Hill MRN: [id number] Date of Birth: 09/04/1934  Today's Date: 03/05/2019 PT Individual Time: 0928-1038 PT Individual Time Calculation (min): 70 min   Short Term Goals: Week 1:  PT Short Term Goal 1 (Week 1): =LTGs due to ELOS  Skilled Therapeutic Interventions/Progress Updates:   Pt in recliner and agreeable to therapy, no c/o pain but reports increased fatigue today. Stand pivot to w/c and total assist w/c transport to/from therapy gym. Worked on endurance w/ functional tasks this session and decreasing supplemental O2 requirement w/ mobility. See O2 vitals below. Ambulated w/ CGA 75' x2 using RW, self-selected slower gait speed this session. Stood to perform cognitive tasks w/o UE support in 3-4 min bouts x2 w/ CGA. Increased work of breathing w/ all mobility this session, however improved compared to previous sessions with this therapist. HR noted to primarily be 90s-110s bpm throughout session, down to 35 and up to 215 at 2 different points. Made RN aware, no further recommendations and no c/o chest pain or heart palpatations. Returned to room and ambulated to/from toilet w/ CGA w/o AD as pt needed to emergently toilet. Pt managed brief w/o physical assist. Sat at sink to wash hands prior to returning to therapy gym. NuStep 5 min x2 @ level 3 for global strengthening and endurance training. Returned to room total assist in w/c. Ended session in recliner, all needs in reach.   97% 3 L/min @ rest >94% on 3 L/min, 75' gait  >93% on 3 L/min, static stance x3-4 min  >97% on 3 L/min, NuStep 5 min   Therapy Documentation Precautions:  Precautions Precautions: Fall Precaution Comments: watch HR, 02 Restrictions Weight Bearing Restrictions: No Vital Signs:   Therapy/Group: Individual Therapy  Cage Gupton Clent Demark 03/05/2019, 10:38 AM

## 2019-03-06 ENCOUNTER — Inpatient Hospital Stay (HOSPITAL_COMMUNITY): Payer: Medicare HMO | Admitting: Speech Pathology

## 2019-03-06 ENCOUNTER — Inpatient Hospital Stay (HOSPITAL_COMMUNITY): Payer: Medicare HMO | Admitting: Occupational Therapy

## 2019-03-06 ENCOUNTER — Inpatient Hospital Stay (HOSPITAL_COMMUNITY): Payer: Medicare HMO

## 2019-03-06 ENCOUNTER — Inpatient Hospital Stay (HOSPITAL_COMMUNITY): Payer: Medicare HMO | Admitting: Physical Therapy

## 2019-03-06 DIAGNOSIS — R7303 Prediabetes: Secondary | ICD-10-CM

## 2019-03-06 LAB — BASIC METABOLIC PANEL
Anion gap: 15 (ref 5–15)
BUN: 21 mg/dL (ref 8–23)
CO2: 21 mmol/L — ABNORMAL LOW (ref 22–32)
Calcium: 8.7 mg/dL — ABNORMAL LOW (ref 8.9–10.3)
Chloride: 101 mmol/L (ref 98–111)
Creatinine, Ser: 0.83 mg/dL (ref 0.61–1.24)
GFR calc Af Amer: >60 mL/min (ref >60)
GFR calc non Af Amer: >60 mL/min (ref >60)
Glucose, Bld: 119 mg/dL — ABNORMAL HIGH (ref 70–99)
Potassium: 3.4 mmol/L — ABNORMAL LOW (ref 3.5–5.1)
Sodium: 137 mmol/L (ref 135–145)

## 2019-03-06 LAB — HEMOGLOBIN A1C
Hgb A1c MFr Bld: 5.7 % — ABNORMAL HIGH (ref 4.8–5.6)
Mean Plasma Glucose: 116.89 mg/dL

## 2019-03-06 LAB — CBC WITH DIFFERENTIAL/PLATELET
Abs Immature Granulocytes: 0.04 10*3/uL (ref 0.00–0.07)
Basophils Absolute: 0 10*3/uL (ref 0.0–0.1)
Basophils Relative: 0 %
Eosinophils Absolute: 0 10*3/uL (ref 0.0–0.5)
Eosinophils Relative: 0 %
HCT: 32.5 % — ABNORMAL LOW (ref 39.0–52.0)
Hemoglobin: 11.1 g/dL — ABNORMAL LOW (ref 13.0–17.0)
Immature Granulocytes: 1 %
Lymphocytes Relative: 11 %
Lymphs Abs: 0.9 10*3/uL (ref 0.7–4.0)
MCH: 30.3 pg (ref 26.0–34.0)
MCHC: 34.2 g/dL (ref 30.0–36.0)
MCV: 88.8 fL (ref 80.0–100.0)
Monocytes Absolute: 0.6 10*3/uL (ref 0.1–1.0)
Monocytes Relative: 7 %
Neutro Abs: 7 10*3/uL (ref 1.7–7.7)
Neutrophils Relative %: 81 %
Platelets: 237 10*3/uL (ref 150–400)
RBC: 3.66 MIL/uL — ABNORMAL LOW (ref 4.22–5.81)
RDW: 11.9 % (ref 11.5–15.5)
WBC: 8.6 10*3/uL (ref 4.0–10.5)
nRBC: 0 % (ref 0.0–0.2)

## 2019-03-06 LAB — MAGNESIUM: Magnesium: 1.5 mg/dL — ABNORMAL LOW (ref 1.7–2.4)

## 2019-03-06 MED ORDER — METOPROLOL TARTRATE 25 MG PO TABS
37.5000 mg | ORAL_TABLET | Freq: Two times a day (BID) | ORAL | Status: DC
  Administered 2019-03-06 – 2019-03-11 (×10): 37.5 mg via ORAL
  Filled 2019-03-06 (×11): qty 1

## 2019-03-06 MED ORDER — MAGNESIUM OXIDE 400 (241.3 MG) MG PO TABS
400.0000 mg | ORAL_TABLET | Freq: Every day | ORAL | Status: DC
  Administered 2019-03-06 – 2019-03-11 (×6): 400 mg via ORAL
  Filled 2019-03-06 (×6): qty 1

## 2019-03-06 MED ORDER — POTASSIUM CHLORIDE CRYS ER 20 MEQ PO TBCR
20.0000 meq | EXTENDED_RELEASE_TABLET | Freq: Every day | ORAL | Status: DC
  Administered 2019-03-06 – 2019-03-11 (×6): 20 meq via ORAL
  Filled 2019-03-06 (×6): qty 1

## 2019-03-06 NOTE — Patient Care Conference (Signed)
Inpatient RehabilitationTeam Conference and Plan of Care Update Date: 03/04/2019   Time: 2:30 PM    Patient Name: Preston Hill      Medical Record Number: [id number]  Date of Birth: [birth year] Sex: Male         Room/Bed: 4M01C/4M01C-01 Payor Info: Payor: [employer] MEDICARE / Plan: [place] HMO / Product Type: *No Product type* /    Admitting Diagnosis: ABI Team  NTBI, SDH; 11-13days  Admit Date/Time:  03/02/2019  5:03 PM Admission Comments: No comment available   Primary Diagnosis:  <principal problem not specified> Principal Problem: <principal problem not specified>  Patient Active Problem List   Diagnosis Date Noted  . Prediabetes   . Supplemental oxygen dependent   . Hypomagnesemia   . Labile blood pressure   . Hypoxia   . Seizures ([place])   . Benign prostatic hyperplasia   . Benign essential HTN   . New onset atrial fibrillation ([employer])   . Hypoalbuminemia due to protein-calorie malnutrition ([place])   . FUO (fever of unknown origin)   . Acute blood loss anemia   . Labile blood glucose   . SDH (subdural hematoma) ([place]) 03/02/2019  . Hypokalemia   . Hyperglycemia   . Atrial fibrillation with rapid ventricular response ([place])   . Subdural hematoma ([place]) 02/21/2019  . Personal history of other malignant neoplasm of skin 05/20/2018  . Bilateral leg edema 05/14/2018  . Injury of tendon of long head of left biceps 03/31/2018  . Tremor 11/06/2017  . First degree AV block 07/09/2017  . Loose body of right knee 07/03/2017  . Primary osteoarthritis of right knee 07/03/2017  . Bradycardia 06/20/2017  . Primary osteoarthritis of left knee 03/22/2017  . Encounter for screening colonoscopy 12/20/2015  . OSA (obstructive sleep apnea) 09/23/2014  . Abnormal ECG 02/26/2014  . Essential hypertension 02/26/2014  . Hyperlipidemia, mixed 02/26/2014    Expected Discharge Date: Expected Discharge Date: 03/13/19  Team Members Present: Physician leading conference: Dr. Delice Lesch Social Worker Present: Lennart Pall, LCSW Nurse Present: Other (comment)(Danny Russella Dar, RN) PT Present: Burnard Bunting, PT OT Present: Mariane Masters, OT SLP Present: Weston Anna, SLP PPS Coordinator present : Ileana Ladd, PT     Current Status/Progress Goal Weekly Team Focus  Medical   Functional and mobility deficits secondary to left acute on chronic fronto parietal SDH  Improve mobility, endurance, electrolytes, urinary retention, hypomagnesemia, supplemental oxygen, Afib  See above   Bowel/Bladder   continent of bowel and bladder, incontinence at times, bladder scan every 8 hours, LBM 6/3  remain continent   assess needs for toileting as needed   Swallow/Nutrition/ Hydration             ADL's   Min A overall-Mod A LB ADLs  Supervision  self-care retraining, activity tolerance, O2 needs, general strengthening, balance training   Mobility   min assist-CGA w/ or w/o RW, gait 50-150' depending on level of fatigue, requires 4 L/min supplemental O2 w/ activity  supervision  endurance, weaning O2, functional balance   Communication   Min A word finding at sentence level  Supervision A  word finding strategies and structured word finding tasks such as divergent/convergent naming   Safety/Cognition/ Behavioral Observations  Max-Mod A  Mod A recall, Min A  semi-complex problem solving, emergent awareness and recall strategies    Pain   no complaints of pain  will remain pain free  assess q shift and as needed    Skin   healed  incision on left upper forehead.   remain free of infection and skin breakdown  assess skin qshift and as needed    Rehab Goals Patient on target to meet rehab goals: Yes *See Care Plan and progress notes for long and short-term goals.     Barriers to Discharge  Current Status/Progress Possible Resolutions Date Resolved   Physician    Medical stability;New oxygen     See above  Therapies, follow labs, check PVRs, attempt to wean supplemental  oxygen, supplement Mag/K+      Nursing                  PT  Medical stability;New oxygen  not on O2 at baseline              OT                  SLP                SW                Discharge Planning/Teaching Needs:  Home with wife who can provide 24/7 assistance.  Son also available.  Teaching to be completed closer to d/c.   Team Discussion:  Watching labs;  Afebrile the past few days.  Need consistent PVRs and documentation.  MD also requests documentation of episodes when pt desats.  1 incont bowel episode.  CGA - min assist with rw 50-100' dependent on fatigue level.  Min - mod for ALDs.  All goals set for supervision overall.  Poor endurance.  Revisions to Treatment Plan:  NA    Continued Need for Acute Rehabilitation Level of Care: The patient requires daily medical management by a physician with specialized training in physical medicine and rehabilitation for the following conditions: Daily direction of a multidisciplinary physical rehabilitation program to ensure safe treatment while eliciting the highest outcome that is of practical value to the patient.: Yes Daily medical management of patient stability for increased activity during participation in an intensive rehabilitation regime.: Yes Daily analysis of laboratory values and/or radiology reports with any subsequent need for medication adjustment of medical intervention for : Blood pressure problems;Urological problems;Other;Pulmonary problems;Cardiac problems   I attest that I was present, lead the team conference, and concur with the assessment and plan of the team.   Lennart Pall 03/06/2019, 9:46 AM    Team conference was held via web/ teleconference due to [place] - 19

## 2019-03-06 NOTE — Progress Notes (Signed)
Physical Therapy Session Note  Patient Details  Name: Preston Hill MRN: [id number] Date of Birth: [birth year]  Today's Date: 03/06/2019 PT Individual Time: 1015-1130 PT Individual Time Calculation (min): 75 min   Short Term Goals: Week 1:  PT Short Term Goal 1 (Week 1): =LTGs due to ELOS  Skilled Therapeutic Interventions/Progress Updates:   Pt in recliner and agreeable to therapy, no c/o pain. Worked on functional endurance and balance this session. On 2 L/min supplemental O2, turned up to 3 L/min during ADLs, spO2 >96%. Ambulated to sink w/ CGA and stood to shave face for 10 min w/ CGA-close supervision for balance. Mild increase in work of breathing during this task. Discussed energy conservation techniques w/ ADLs at home, including keeping a chair nearby for rest breaks. Toilet transfer w/ CGA, pt managing LE garments and pericare w/o assist. Needed seated rest break after toilet transfer 2/2 fatigue due to prolonged standing this session. Stood at sink to finish shaving face, another 10 min w/ CGA to close supervision.   Brief rest before ambulating to therapy gym, ~100' w/ close supervision on 3 L/min. O2 stayed above 93% throughout gait bout, but then down to 85% right before sitting down. Needed 1-108min and turned up to 4 L/min to return to >90%. Verbal cues for breathing strategies. Ambulated back to room after rest break, O2 >95% during walk w/ 3 L/min and did not drop at end. O2 turned down to 2 L/min and then 1 L/min while seated in recliner. Monitored O2 during transition down, >93%. RN agreeable to check O2 in 10-15 min to make sure pt is maintaining oxygenation at rest. Ended session in recliner, all needs in reach.    Therapy Documentation Precautions:  Precautions Precautions: Fall Precaution Comments: watch HR, 02 Restrictions Weight Bearing Restrictions: No Pain:    Therapy/Group: Individual Therapy  Smt. Loder K Icholas Irby 03/06/2019, 1:13 PM

## 2019-03-06 NOTE — Progress Notes (Signed)
Occupational Therapy Session Note  Patient Details  Name: Preston Hill MRN: [id number] Date of Birth: [birth year]  Today's Date: 03/06/2019 OT Individual Time: 1445-1539 OT Individual Time Calculation (min): 54 min    Short Term Goals: Week 1:  OT Short Term Goal 1 (Week 1): STG=LTG 2/2 ELOS  Skilled Therapeutic Interventions/Progress Updates:    1:1. Pt received in bed attempting ot nap, however eventually agreeable to session. tp completes ambulatory transfer to stand over toilet on 3L O2 to void bladder into toilet. Pt completes seated grooming at sink with set up for energy conservation. Pt completes ambulation with RW with 1 seated rest break on 3L O2 to dayroom. Pt O2 drops to 87% and recovers <30 secnds when increased to 4 L O2 and begins pursed lip breathing. Pt completes 2x5 sit to stands with no UE push. Pt completes standing balance activity playing giant connect 4 with O2 dropping to 86% on 3 L. Exited session with pt seated in w/c, call light in reach and exit alarm on  Therapy Documentation Precautions:  Precautions Precautions: Fall Precaution Comments: watch HR, 02 Restrictions Weight Bearing Restrictions: No General:   Vital Signs: Therapy Vitals Temp: 99.7 F (37.6 C) Temp Source: Oral Pulse Rate: 100 Resp: 20 BP: (!) 134/100 Patient Position (if appropriate): Lying Oxygen Therapy SpO2: 94 % O2 Device: Room Air Pain:   ADL: ADL Eating: Set up Grooming: Supervision/safety Upper Body Bathing: Setup Lower Body Bathing: Moderate assistance Upper Body Dressing: Minimal assistance Lower Body Dressing: Maximal assistance Toileting: Minimal assistance Toilet Transfer: Minimal assistance Toilet Transfer Method: Ambulating Tub/Shower Transfer: Unable to assess Vision   Perception    Praxis   Exercises:   Other Treatments:     Therapy/Group: Individual Therapy  Tonny Branch 03/06/2019, 2:59 PM

## 2019-03-06 NOTE — Progress Notes (Signed)
Occupational Therapy Session Note  Patient Details  Name: Preston Hill MRN: [id number] Date of Birth: 02/13/1934  Today's Date: 03/06/2019 OT Individual Time: 4536-4680 OT Individual Time Calculation (min): 41 min   Short Term Goals: Week 1:  OT Short Term Goal 1 (Week 1): STG=LTG 2/2 ELOS  Skilled Therapeutic Interventions/Progress Updates:    Pt greeted sitting in recliner finishing breakfast and agreeable to OT treatment session. Pt on 3L of O2 for activity. Pt needed verbal cues for hand placement with sit<>stand using RW. OT educated on RW positioning at the sink. Pt reported need to go to the bathroom and ambulated into the bathroom w/ RW and CGA. CGA for balance when doffing pants. OT educated on energy conservation techniques and sitting to urinate. Pt then ambulated out of the bathroom and stood at the sink to brush teeth and hair for 3 minutes without rest break. Pt completed 10 mins on [employer] arm bike for UB strengthening and returned to room at end of session. Pt left seated in recliner with chair alarm on and needs met.   Therapy Documentation Precautions:  Precautions Precautions: Fall Precaution Comments: watch HR, 02 Restrictions Weight Bearing Restrictions: No Pain:  denies pain   Therapy/Group: Individual Therapy  Valma Cava 03/06/2019, 8:33 AM

## 2019-03-06 NOTE — Progress Notes (Signed)
[facility] PHYSICAL MEDICINE & REHABILITATION PROGRESS NOTE  Subjective/Complaints: Patient seen laying in bed this morning.  He states he slept well overnight.  He denies complaints.  ROS: Denies CP, shortness of breath, nausea, vomiting, diarrhea, fevers, chills.  Objective: Vital Signs: Blood pressure (!) 152/94, pulse (!) 102, temperature 100.2 F (37.9 C), temperature source Oral, resp. rate 20, height 5\' 6"  (1.676 m), weight 82.5 kg, SpO2 91 %. No results found. Recent Labs    03/06/19 0551  WBC 8.6  HGB 11.1*  HCT 32.5*  PLT 237   Recent Labs    03/06/19 0551  NA 137  K 3.4*  CL 101  CO2 21*  GLUCOSE 119*  BUN 21  CREATININE 0.83  CALCIUM 8.7*    Physical Exam: BP (!) 152/94 (BP Location: Left Arm)   Pulse (!) 102   Temp 100.2 F (37.9 C) (Oral)   Resp 20   Ht 5\' 6"  (1.676 m)   Wt 82.5 kg   SpO2 91%   BMI 29.36 kg/m  Constitutional: No distress . Vital signs reviewed. HENT: Normocephalic.  Atraumatic. Eyes: EOMI.  No discharge. Cardiovascular: No JVD. Respiratory: Normal effort.  + [place]. GI: Non-distended. Musc: No edema or tenderness in extremities. Neurological: He isalert. Reasonable insight and awareness.  Speech clear.  Motor: RUE: Grossly 5/5 throughout, unchanged Skin: Skin iswarm. Noerythema.  Psychiatric: He has anormal mood and affect. Hisbehavior is normal.  Assessment/Plan: 1. 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  Physiatrist is providing close team supervision and 24 hour management of active medical problems listed below.  Physiatrist and rehab team continue to assess barriers to discharge/monitor patient progress toward functional and medical goals  Care Tool:  Bathing    Body parts bathed by patient: Right arm, Left arm, Chest, Abdomen, Front perineal area, Right upper  leg, Left upper leg, Face   Body parts bathed by helper: Left lower leg, Right lower leg, Buttocks     Bathing assist Assist Level: Minimal Assistance - Patient > 75%     Upper Body Dressing/Undressing Upper body dressing   What is the patient wearing?: Pull over shirt    Upper body assist Assist Level: Contact Guard/Touching assist    Lower Body Dressing/Undressing Lower body dressing      What is the patient wearing?: Incontinence brief, Pants     Lower body assist Assist for lower body dressing: Maximal Assistance - Patient 25 - 49%     Toileting Toileting    Toileting assist Assist for toileting: Moderate Assistance - Patient 50 - 74%     Transfers Chair/bed transfer  Transfers assist     Chair/bed transfer assist level: Contact Guard/Touching assist     Locomotion Ambulation   Ambulation assist      Assist level: Contact Guard/Touching assist Assistive device: Walker-rolling Max distance: 75'   Walk 10 feet activity   Assist     Assist level: Contact Guard/Touching assist Assistive device: Walker-rolling   Walk 50 feet activity   Assist    Assist level: Contact Guard/Touching assist Assistive device: Walker-rolling    Walk 150 feet activity   Assist Walk 150 feet activity did not occur: Safety/medical concerns         Walk 10 feet on uneven surface  activity   Assist Walk 10 feet on uneven surfaces activity did not occur: Safety/medical concerns  Wheelchair     Assist Will patient use wheelchair at discharge?: No   Wheelchair activity did not occur: N/A         Wheelchair 50 feet with 2 turns activity    Assist    Wheelchair 50 feet with 2 turns activity did not occur: N/A       Wheelchair 150 feet activity     Assist Wheelchair 150 feet activity did not occur: N/A          Medical Problem List and Plan: 1.Functional and mobility deficitssecondary to left acute on chronic  fronto parietal SDH  Continue CIR 2. Antithrombotics: -DVT/anticoagulation:Mechanical:Sequential compression devices, below kneeBilateral lower extremities  Dopplers negative for DVT -antiplatelet therapy: Off ASA 3. Pain Management:Hydrocodone prn. 4. Mood:LCSW to follow for evaluation and support. -antipsychotic agents: N/A 5. Neuropsych: This patientiscapable of making decisions onhisown behalf. 6. Skin/Wound Care:Monitor incision for healing. 7. Fluids/Electrolytes/Nutrition:Monitor I/Os 8. New onset seizures/Status epilepticus?: On Keppra 1000 mg bid and Dilantin 100 mg tid.   Last phenytoin level low on 6/1 9. OSA: Encourage compliance with CPAP 10. BPH: Resumed Flomax --was on 0.8 mg/HS.  PVRs unremarkable 11. ?CKD: Improved with IVF for hydration. Has history of peripheral edema--d/c IVF to avoid overload.  Cr 0.81 on 6/2 13. HTN: Monitor BP tid--continue to hold Norvasc, Lisinopril and propranolol (question for tremors).  Metoprolol increased to 25 twice daily on 6/4, increased to 37.5 on 6/5  Monitor with increased mobility 14. Hypokalemia:  Supplement added on 6/5  Potassium 3.4 on 6/5  Cont to monitor 15. New onset A fib: Monitor HR bid.   metoprolol increased to 25 mg bid on 6/4, increased again on 6/5 16. Hypoxia: Encourage IS.   Wean supplemental oxygen as tolerated, continues to require.   CXR reviewed, unremarkable for infection 17. Hypoalbuminemia  Supplement initiated on 6/2 18. Fever: Resolved  Started on IV Vanc, DC'd  Blood cultures no growth to date on 6/5  UA likely neg, Ucx less than 40,000  CXR unremarkable for infection 19. ABLA  Hb 11.1 on 6/5, labs ordered for Monday  Cont to monitor 20.  Prediabetes  Hemoglobin A1c 5.7 on 6/5  Labile on 6/5 21.  Hypomagnesemia  Magnesium 1.5 on 6/5  Daily supplementation started on 6/5  Will continue to monitor  LOS: 4 days A FACE TO FACE EVALUATION WAS  PERFORMED  Ankit Lorie Phenix 03/06/2019, 8:21 AM

## 2019-03-06 NOTE — Progress Notes (Signed)
Speech Language Pathology Daily Session Note  Patient Details  Name: Preston Hill MRN: [id number] Date of Birth: [birth year]  Today's Date: 03/06/2019 SLP Individual Time: 1245-1345 SLP Individual Time Calculation (min): 60 min  Short Term Goals: Week 1: SLP Short Term Goal 1 (Week 1): Pt will utilize external memory aids and compensatory strategies to recall new, daily information with mod A verbal cues. SLP Short Term Goal 2 (Week 1): Pt will complete semi-complex functional problem solving tasks with min A verbal cues. SLP Short Term Goal 3 (Week 1): Pt will utilize word finding strategies at sentence level with supervision A verbal cues. SLP Short Term Goal 4 (Week 1): Pt will self-monitor and self-correct functional errors with min A verbal cues during problem solving tasks.  Skilled Therapeutic Interventions:  Skilled treatment session focused on cognition. SLP received pt on toilet without supervision. SLP provided Min A to sit to stand from toilet, CGA clothing management and CGA for use of walker to ambulate back to recliner. Pt's O2 remained at 90% without supplemental O2. It was monitored throughout session and remained stable without supplement O2. SLP re-visited previous calendar task but pt was not effective in completing. At baseline, he reports inability to complete with wife providing care for such tasks. SLP further facilitated session by providing conversational topics unknown to listener. Pt didn't demonstrate any word finding deficits within semi-complex conversation. Pt left upright in recliner, seat alarm on and all needs within reach. Continue per current plan of care.      Pain    Therapy/Group: Individual Therapy  Anayelli Lai 03/06/2019, 2:45 PM

## 2019-03-06 NOTE — Progress Notes (Signed)
Social Work Patient ID: Preston Hill, male   DOB: [birth year], 85 y.o.   MRN: [id number]   Have reviewed team conference with pt and wife.  Both aware and agreeable with targeted d/c date of 6/12 and supervision goals overall.  Hopeful pt can wean off O2 but aware this may still be needed at home.  Continue to follow.  Preston Wiederholt, LCSW

## 2019-03-07 ENCOUNTER — Inpatient Hospital Stay (HOSPITAL_COMMUNITY): Payer: Medicare HMO | Admitting: Speech Pathology

## 2019-03-07 ENCOUNTER — Inpatient Hospital Stay (HOSPITAL_COMMUNITY): Payer: Medicare HMO | Admitting: Physical Therapy

## 2019-03-07 ENCOUNTER — Inpatient Hospital Stay (HOSPITAL_COMMUNITY): Payer: Medicare HMO | Admitting: Occupational Therapy

## 2019-03-07 LAB — CULTURE, BLOOD (ROUTINE X 2)
Culture: NO GROWTH
Culture: NO GROWTH
Special Requests: ADEQUATE

## 2019-03-07 NOTE — Progress Notes (Signed)
Speech Language Pathology Daily Session Note  Patient Details  Name: Preston Hill MRN: [id number] Date of Birth: 1933-11-22  Today's Date: 03/07/2019 SLP Individual Time: 0915-1015 SLP Individual Time Calculation (min): 60 min  Short Term Goals: Week 1: SLP Short Term Goal 1 (Week 1): Pt will utilize external memory aids and compensatory strategies to recall new, daily information with mod A verbal cues. SLP Short Term Goal 2 (Week 1): Pt will complete semi-complex functional problem solving tasks with min A verbal cues. SLP Short Term Goal 3 (Week 1): Pt will utilize word finding strategies at sentence level with supervision A verbal cues. SLP Short Term Goal 4 (Week 1): Pt will self-monitor and self-correct functional errors with min A verbal cues during problem solving tasks.  Skilled Therapeutic Interventions:  Skilled treatment session focused on cognition goals. SLP facilitated session by providing Min A cues for pt to control breathing during bed mobility. Despite appearing SOB, pt able to maintain O2 sats at 93% without supplemental O2. SLP further facilitated session by providing math word problems targeting daily functional activities. Pt with increased anxiety d/t fear of not being able to complete exercises and impulsivity had to go to toilet for loose stools. Pt very impulsive with transfers, use of walker and required Mod A cues to control breathing during all of activity. SLP facilitated pericare and aided in transfer to bed. With emotional encouragement, pt able to problem solve and sequence thru first 5 math problems. Despite support and encouragement, pt very fatigued by session. Pt left upright in bed, bed alarm on and all needs within reach. Continue per current plan of care.      Pain Pain Assessment Pain Scale: 0-10 Pain Score: 0-No pain  Therapy/Group: Individual Therapy  Jannice Beitzel 03/07/2019, 12:02 PM

## 2019-03-07 NOTE — Progress Notes (Signed)
Occupational Therapy Session Note  Patient Details  Name: JAMARII BANKS MRN: [id number] Date of Birth: Feb 21, 1934  Today's Date: 03/07/2019 OT Individual Time: 0990-6893 OT Individual Time Calculation (min): 57 min    Short Term Goals: Week 1:  OT Short Term Goal 1 (Week 1): STG=LTG 2/2 ELOS      Skilled Therapeutic Interventions/Progress Updates:    Pt seen this session for ADL training with a focus on activity tolerance.  Pt received in bed on room air only.  Throughout the session, his O2 sats checked numerous times with activity - ambulation in room, bathing, dressing.  He was in the 94-95% the entire session on room air.  Pt stated that he has been having difficulty reaching his feet even before this admission.  He needed A to doff and don socks, wash feet, min A to pull briefs and pants over R foot (he could do the L).  Pt would benefit from AE of long sponge, reacher and sock aide.  He was able to do all sit to stands and transfers with S.  Excellent participation.    Pt resting in w/c with chair alarm on and all needs met.  Therapy Documentation Precautions:  Precautions Precautions: Fall Precaution Comments: watch HR, 02 Restrictions Weight Bearing Restrictions: No    Vital Signs: Therapy Vitals Pulse Rate: 87 Oxygen Therapy SpO2: 94 % O2 Device: Room Air Pain: Pain Assessment Pain Scale: 0-10 Pain Score: 0-No pain    Therapy/Group: Individual Therapy  [place] 03/07/2019, 11:28 AM

## 2019-03-07 NOTE — Progress Notes (Signed)
Preston Hill is a 85 y.o. male [birth year] [id number]  Subjective: No new complaints. No new problems. Slept well. Feeling OK.  Objective: Vital signs in last 24 hours: Temp:  [99.3 F (37.4 C)-99.7 F (37.6 C)] 99.7 F (37.6 C) (06/06 0508) Pulse Rate:  [84-107] 87 (06/06 0922) Resp:  [20] 20 (06/06 0508) BP: (129-155)/(99-100) 129/99 (06/06 0508) SpO2:  [89 %-95 %] 94 % (06/06 1128) Weight change:  Last BM Date: 03/06/19  Intake/Output from previous day: 06/05 0701 - 06/06 0700 In: 684 [P.O.:684] Out: -   Physical Exam General: No apparent distress    Lungs: Normal effort. Lungs clear to auscultation, no crackles or wheezes. Cardiovascular: Regular rate and rhythm, no edema Neurological: No new neurological deficits   Lab Results: BMET    Component Value Date/Time   NA 137 03/06/2019 0551   K 3.4 (L) 03/06/2019 0551   K 3.4 (L) 06/09/2013 1140   CL 101 03/06/2019 0551   CO2 21 (L) 03/06/2019 0551   GLUCOSE 119 (H) 03/06/2019 0551   BUN 21 03/06/2019 0551   CREATININE 0.83 03/06/2019 0551   CALCIUM 8.7 (L) 03/06/2019 0551   GFRNONAA >60 03/06/2019 0551   GFRAA >60 03/06/2019 0551   CBC    Component Value Date/Time   WBC 8.6 03/06/2019 0551   RBC 3.66 (L) 03/06/2019 0551   HGB 11.1 (L) 03/06/2019 0551   HCT 32.5 (L) 03/06/2019 0551   PLT 237 03/06/2019 0551   MCV 88.8 03/06/2019 0551   MCH 30.3 03/06/2019 0551   MCHC 34.2 03/06/2019 0551   RDW 11.9 03/06/2019 0551   LYMPHSABS 0.9 03/06/2019 0551   MONOABS 0.6 03/06/2019 0551   EOSABS 0.0 03/06/2019 0551   BASOSABS 0.0 03/06/2019 0551   CBG's (last 3):  No results for input(s): GLUCAP in the last 72 hours. LFT's Lab Results  Component Value Date   ALT 47 (H) 03/03/2019   AST 41 03/03/2019   ALKPHOS 45 03/03/2019   BILITOT 0.8 03/03/2019    Studies/Results: No results found.  Medications:  I have reviewed the patient's current medications. Scheduled Medications: . chlorhexidine  15 mL  Mouth Rinse BID  . feeding supplement (PRO-STAT SUGAR FREE 64)  30 mL Oral BID  . levETIRAcetam  1,000 mg Oral BID  . magnesium oxide  400 mg Oral Daily  . mouth rinse  15 mL Mouth Rinse q12n4p  . metoprolol tartrate  37.5 mg Oral BID  . pantoprazole  40 mg Oral Daily  . potassium chloride  20 mEq Oral Daily  . tamsulosin  0.8 mg Oral QHS   PRN Medications: acetaminophen, alum & mag hydroxide-simeth, bisacodyl, diphenhydrAMINE, guaiFENesin-dextromethorphan, HYDROcodone-acetaminophen, LORazepam, polyethylene glycol, prochlorperazine **OR** prochlorperazine **OR** prochlorperazine, sodium phosphate, traZODone  Assessment/Plan: Active Problems:   SDH (subdural hematoma) (HCC)   Hypoxia   Seizures (HCC)   Benign prostatic hyperplasia   Benign essential HTN   New onset atrial fibrillation (HCC)   Hypoalbuminemia due to protein-calorie malnutrition (HCC)   FUO (fever of unknown origin)   Acute blood loss anemia   Labile blood glucose   Hypomagnesemia   Labile blood pressure   Supplemental oxygen dependent   Prediabetes  1.  Left frontoparietal subdural hematoma, acute on chronic, with functional deficits requiring interdisciplinary therapy with comprehensive inpatient rehab setting.  Continue care as ongoing. 2.  New seizure disorder because of #1.  Continue antiepileptics with Keppra and Dilantin as prescribed.  No reported breakthrough activity observed. 3.  Obstructive sleep apnea.  CPAP compliance encouraged. 4.  BPH, chronic.  On Flomax as prior to admission. 5.  Hypertension.  Blood pressures reviewed.  Continue current medications with titration as needed.  Length of stay, days: 5   Valerie A. Asa Lente, MD 03/07/2019, 12:10 PM

## 2019-03-07 NOTE — Progress Notes (Signed)
Physical Therapy Session Note  Patient Details  Name: Preston Hill MRN: [id number] Date of Birth: 1934-02-23  Today's Date: 03/07/2019 PT Individual Time: 1300-1355 PT Individual Time Calculation (min): 55 min   Short Term Goals: Week 1:  PT Short Term Goal 1 (Week 1): =LTGs due to ELOS  Skilled Therapeutic Interventions/Progress Updates:   Pt received sitting in WC and agreeable to PT. Pt reports need for urinaiton. Toilet transfer with RW and supervision assist. Pt able to have continent urination while sitting on toilet. Sit<>stand from toilet with supervision assist from PT for safety. Pt able to complete hand hygiene with supervision assist no no UE support.   Gait training in hall with RW 2x 12f and supervision assist. Additional gait training without AD x 1084fand CGA. Dynamic gait training with and without AD to step over 1 inch obstacles. Weave through cones x 12 with RW and x 12 without AD. And ascend/descend curb x 2 with RW. Min cues for posture, gait pattern and safety in turns. PT assessed SpO2 throughout treatment And found to be >92% throughout treatment on room air following each activity.  [facility] returned to [facility] and [facility] in reach and all needs met.        Therapy Documentation Precautions:  Precautions Precautions: Fall Precaution Comments: watch HR, 02 Restrictions Weight Bearing Restrictions: No Vital Signs: Therapy Vitals Temp: 97.9 F (36.6 C) Temp Source: Oral Pulse Rate: 75 Resp: 20 BP: 132/69 Patient Position (if appropriate): Sitting Oxygen Therapy SpO2: 94 % O2 Device: Room Air Pain: Pain Assessment Pain Score: 0-No pain    Therapy/Group: Individual Therapy  AuLorie Phenix/03/2019, 2:32 PM

## 2019-03-07 NOTE — Plan of Care (Signed)
  Problem: Consults Goal: RH STROKE PATIENT EDUCATION Description See Patient Education module for education specifics  Outcome: Progressing   Problem: RH BLADDER ELIMINATION Goal: RH STG MANAGE BLADDER WITH ASSISTANCE Description STG Manage Bladder With Min.Assistance  Outcome: Progressing Goal: RH STG MANAGE BLADDER WITH MEDICATION WITH ASSISTANCE Description STG Manage Bladder With Medication With Min.Assistance.  Outcome: Progressing   Problem: RH SAFETY Goal: RH STG ADHERE TO SAFETY PRECAUTIONS W/ASSISTANCE/DEVICE Description STG Adhere to Safety Precautions With Min. Assistance/Device.  Outcome: Progressing   Problem: RH KNOWLEDGE DEFICIT Goal: RH STG INCREASE KNOWLEDGE OF HYPERTENSION Description Pt. Will be able to verbalized the proper diet and medications regiment to keep BP under control.  Outcome: Progressing Goal: RH STG INCREASE KNOWLEDGE OF STROKE PROPHYLAXIS Description Pt. Will be able to verbalize the preventive measurements to prevent strokes,like keep active,low sodium diet and follow up with MD.  Outcome: Progressing

## 2019-03-07 NOTE — Progress Notes (Signed)
Pt has home CPAP. Will place self on tonight. Told to call this RT if help needed.

## 2019-03-08 ENCOUNTER — Inpatient Hospital Stay (HOSPITAL_COMMUNITY): Payer: Medicare HMO | Admitting: Occupational Therapy

## 2019-03-08 ENCOUNTER — Inpatient Hospital Stay (HOSPITAL_COMMUNITY): Payer: Medicare HMO

## 2019-03-08 NOTE — Progress Notes (Signed)
Preston Hill is a 85 y.o. male May 30, 1934 [id number]  Subjective: No new concerns or problems overnight.  Objective: Vital signs in last 24 hours: Temp:  [97.9 F (36.6 C)-99.5 F (37.5 C)] 98.3 F (36.8 C) (06/07 0651) Pulse Rate:  [75-98] 98 (06/07 0651) Resp:  [18-20] 20 (06/07 0651) BP: (132-137)/(69-94) 137/94 (06/07 0651) SpO2:  [94 %-95 %] 94 % (06/07 0651) Weight change:  Last BM Date: 03/07/19  Intake/Output from previous day: 06/06 0701 - 06/07 0700 In: 960 [P.O.:960] Out: 400 [Urine:400]  Physical Exam General: No apparent distress    Lungs: Normal effort. Lungs clear to auscultation, no crackles or wheezes. Cardiovascular: Regular rate and rhythm, no edema Neurological: No new neurological deficits   Lab Results: BMET    Component Value Date/Time   NA 137 03/06/2019 0551   K 3.4 (L) 03/06/2019 0551   K 3.4 (L) 06/09/2013 1140   CL 101 03/06/2019 0551   CO2 21 (L) 03/06/2019 0551   GLUCOSE 119 (H) 03/06/2019 0551   BUN 21 03/06/2019 0551   CREATININE 0.83 03/06/2019 0551   CALCIUM 8.7 (L) 03/06/2019 0551   GFRNONAA >60 03/06/2019 0551   GFRAA >60 03/06/2019 0551   CBC    Component Value Date/Time   WBC 8.6 03/06/2019 0551   RBC 3.66 (L) 03/06/2019 0551   HGB 11.1 (L) 03/06/2019 0551   HCT 32.5 (L) 03/06/2019 0551   PLT 237 03/06/2019 0551   MCV 88.8 03/06/2019 0551   MCH 30.3 03/06/2019 0551   MCHC 34.2 03/06/2019 0551   RDW 11.9 03/06/2019 0551   LYMPHSABS 0.9 03/06/2019 0551   MONOABS 0.6 03/06/2019 0551   EOSABS 0.0 03/06/2019 0551   BASOSABS 0.0 03/06/2019 0551   CBG's (last 3):  No results for input(s): GLUCAP in the last 72 hours. LFT's Lab Results  Component Value Date   ALT 47 (H) 03/03/2019   AST 41 03/03/2019   ALKPHOS 45 03/03/2019   BILITOT 0.8 03/03/2019    Studies/Results: No results found.  Medications:  I have reviewed the patient's current medications. Scheduled Medications: . chlorhexidine  15 mL Mouth  Rinse BID  . feeding supplement (PRO-STAT SUGAR FREE 64)  30 mL Oral BID  . levETIRAcetam  1,000 mg Oral BID  . magnesium oxide  400 mg Oral Daily  . mouth rinse  15 mL Mouth Rinse q12n4p  . metoprolol tartrate  37.5 mg Oral BID  . pantoprazole  40 mg Oral Daily  . potassium chloride  20 mEq Oral Daily  . tamsulosin  0.8 mg Oral QHS   PRN Medications: acetaminophen, alum & mag hydroxide-simeth, bisacodyl, diphenhydrAMINE, guaiFENesin-dextromethorphan, HYDROcodone-acetaminophen, LORazepam, polyethylene glycol, prochlorperazine **OR** prochlorperazine **OR** prochlorperazine, sodium phosphate, traZODone  Assessment/Plan: Active Problems:   SDH (subdural hematoma) (HCC)   Hypoxia   Seizures (HCC)   Benign prostatic hyperplasia   Benign essential HTN   New onset atrial fibrillation (HCC)   Hypoalbuminemia due to protein-calorie malnutrition (HCC)   FUO (fever of unknown origin)   Acute blood loss anemia   Labile blood glucose   Hypomagnesemia   Labile blood pressure   Supplemental oxygen dependent   Prediabetes  1.  Left frontoparietal subdural hematoma, acute on chronic, with functional deficits requiring interdisciplinary therapy with comprehensive inpatient rehab setting.  Continue care as ongoing. 2.  New seizure disorder because of #1.  Continue antiepileptics with Keppra and Dilantin as prescribed.  No reported breakthrough activity observed. 3.  Obstructive sleep apnea.  CPAP compliance encouraged.  4.  BPH, chronic.  On Flomax as prior to admission. 5.  Hypertension.  Blood pressures reviewed.  Continue current medications with titration as needed. 6. Hypokalemia. Continue replacement. Recheck as planned.  Length of stay, days: 6   Avana Kreiser A. Asa Lente, MD 03/08/2019, 9:15 AM

## 2019-03-08 NOTE — Plan of Care (Signed)
  Problem: Consults Goal: RH STROKE PATIENT EDUCATION Description See Patient Education module for education specifics  Outcome: Progressing   Problem: RH BLADDER ELIMINATION Goal: RH STG MANAGE BLADDER WITH ASSISTANCE Description STG Manage Bladder With Min.Assistance  Outcome: Progressing Goal: RH STG MANAGE BLADDER WITH MEDICATION WITH ASSISTANCE Description STG Manage Bladder With Medication With Min.Assistance.  Outcome: Progressing   Problem: RH SAFETY Goal: RH STG ADHERE TO SAFETY PRECAUTIONS W/ASSISTANCE/DEVICE Description STG Adhere to Safety Precautions With Min. Assistance/Device.  Outcome: Progressing   Problem: RH KNOWLEDGE DEFICIT Goal: RH STG INCREASE KNOWLEDGE OF HYPERTENSION Description Pt. Will be able to verbalized the proper diet and medications regiment to keep BP under control.  Outcome: Progressing Goal: RH STG INCREASE KNOWLEDGE OF STROKE PROPHYLAXIS Description Pt. Will be able to verbalize the preventive measurements to prevent strokes,like keep active,low sodium diet and follow up with MD.  Outcome: Progressing

## 2019-03-08 NOTE — Progress Notes (Signed)
Occupational Therapy Session Note  Patient Details  Name: Preston Hill MRN: [id number] Date of Birth: 04/01/1934  Today's Date: 03/08/2019 OT Individual Time: 0755-0900 OT Individual Time Calculation (min): 65 min   Short Term Goals: Week 1:  OT Short Term Goal 1 (Week 1): STG=LTG 2/2 ELOS  Skilled Therapeutic Interventions/Progress Updates:    Pt greeted EOB with RN present. Reporting arthritic pain to be manageable without medicinal interventions. Amenable to shower. He completed toileting (sit<stand from standard toilet, continent bladder void), bathing (sit<stand in shower), and dressing (EOB sit<stand with device) during session. All functional transfers completed using RW at ambulatory level with supervision. Vcs required for safe DME mgt/positioning during functional activity. He also gathered clothing items at start of ADL, stooping to 2nd drawer with supervision assist. Educated pt on using reacher, sock aide, and LH sponge during self care. Pt required instruction and Min A for sock aide only. Increased difficultly/exertion with using sock aide due to foot sticking to device, even after we applied powder to plastic. Supervision sit<stands with vcs for hand placement. Supervision/cuing-Min A overall for stated tasks. At end of session pt was left in w/c to proceed with shaving and oral care at sink. He stated he could propel w/c back towards bedside when he was finished. Chair alarm set. NT made aware of pts position.   02 sats monitored throughout session, remaining between 92-96% on RA.   Therapy Documentation Precautions:  Precautions Precautions: Fall Precaution Comments: watch HR, 02 Restrictions Weight Bearing Restrictions: No Vital Signs: Therapy Vitals Temp: 98.3 F (36.8 C) Temp Source: Oral Pulse Rate: 98 Resp: 20 BP: (!) 137/94 Patient Position (if appropriate): Sitting Oxygen Therapy SpO2: 94 % O2 Device: Room Air Pain: Pain Assessment Pain Scale: 0-10 Pain  Score: 0-No pain ADL: ADL Eating: Set up Grooming: Supervision/safety Upper Body Bathing: Setup Lower Body Bathing: Moderate assistance Upper Body Dressing: Minimal assistance Lower Body Dressing: Maximal assistance Toileting: Minimal assistance Toilet Transfer: Minimal assistance Toilet Transfer Method: Ambulating Tub/Shower Transfer: Unable to assess      Therapy/Group: Individual Therapy  Chika Cichowski A Armeda Plumb 03/08/2019, 9:04 AM

## 2019-03-08 NOTE — Progress Notes (Signed)
Physical Therapy Session Note  Patient Details  Name: Preston Hill MRN: [id number] Date of Birth: 1934-02-07  Today's Date: 03/08/2019 PT Individual Time: 1000-1059 PT Individual Time Calculation (min): 59 min   Short Term Goals: Week 1:  PT Short Term Goal 1 (Week 1): =LTGs due to ELOS  Skilled Therapeutic Interventions/Progress Updates:    Pt seated in w/c upon PT arrival, agreeable to therapy tx and denies pain. Pt performed sit<>stand with RW and supervision, ambulated to the gym x 100 ft with RW and supervision. SpO2 94% while on RA, following activity. Pt then ambulated 2 x 150 ft from gym<> dayroom with RW and supervision this session. In dayroom pt used nustep x 8 minutes on workload 5 for global strengthening and cardiovascular endurance. SpO2 94%, HR 84 bpm on RA following activity. Pt worked on standing balance this session without UE support to perform the following activities: toe taps on aerobic step, ambulation without AD, backwards walking, sidestepping in each direction, standing on red wedge while tossing horseshoes x2 trials, all with CGA-min assist. SpO2 after these activities- 88% on RA. Pt ambulated back to room with supervision and RW, pt ambulated in/out of bathroom and continent of bowel and bladder, performed all clothing management and pericare without assist, standing at sink to wash hands after. Pt left in w/c with needs in reach and chair alarm set, SpO2 95% on RA.   Therapy Documentation Precautions:  Precautions Precautions: Fall Precaution Comments: watch HR, 02 Restrictions Weight Bearing Restrictions: No    Therapy/Group: Individual Therapy  Netta Corrigan, PT, DPT 03/08/2019, 7:52 AM

## 2019-03-09 ENCOUNTER — Inpatient Hospital Stay (HOSPITAL_COMMUNITY): Payer: Medicare HMO | Admitting: Occupational Therapy

## 2019-03-09 ENCOUNTER — Inpatient Hospital Stay (HOSPITAL_COMMUNITY): Payer: Medicare HMO | Admitting: Speech Pathology

## 2019-03-09 ENCOUNTER — Inpatient Hospital Stay (HOSPITAL_COMMUNITY): Payer: Medicare HMO | Admitting: Physical Therapy

## 2019-03-09 LAB — CBC WITH DIFFERENTIAL/PLATELET
Abs Immature Granulocytes: 0.02 10*3/uL (ref 0.00–0.07)
Basophils Absolute: 0 10*3/uL (ref 0.0–0.1)
Basophils Relative: 0 %
Eosinophils Absolute: 0 10*3/uL (ref 0.0–0.5)
Eosinophils Relative: 1 %
HCT: 32.3 % — ABNORMAL LOW (ref 39.0–52.0)
Hemoglobin: 10.9 g/dL — ABNORMAL LOW (ref 13.0–17.0)
Immature Granulocytes: 0 %
Lymphocytes Relative: 15 %
Lymphs Abs: 0.9 10*3/uL (ref 0.7–4.0)
MCH: 30.1 pg (ref 26.0–34.0)
MCHC: 33.7 g/dL (ref 30.0–36.0)
MCV: 89.2 fL (ref 80.0–100.0)
Monocytes Absolute: 0.3 10*3/uL (ref 0.1–1.0)
Monocytes Relative: 6 %
Neutro Abs: 4.6 10*3/uL (ref 1.7–7.7)
Neutrophils Relative %: 78 %
Platelets: 292 10*3/uL (ref 150–400)
RBC: 3.62 MIL/uL — ABNORMAL LOW (ref 4.22–5.81)
RDW: 11.9 % (ref 11.5–15.5)
WBC: 5.9 10*3/uL (ref 4.0–10.5)
nRBC: 0 % (ref 0.0–0.2)

## 2019-03-09 NOTE — Progress Notes (Signed)
Patient slept fairly well, awakened only for toileting. No problems voiced. No prns given.

## 2019-03-09 NOTE — Progress Notes (Signed)
Physical Therapy Session Note  Patient Details  Name: Preston Hill MRN: [id number] Date of Birth: 07-Jul-1934  Today's Date: 03/09/2019 PT Individual Time: 0915-1012 PT Individual Time Calculation (min): 57 min   Short Term Goals: Week 1:  PT Short Term Goal 1 (Week 1): =LTGs due to ELOS  Skilled Therapeutic Interventions/Progress Updates:   Pt in w/c and agreeable to therapy, no c/o pain. Pt reports he has not needed supplemental O2 all weekend. Ambulated to/from therapy gym w/ supervision using RW. O2 >90% on RA when spO2 was monitored while ambulating. Ambulated 100' w/o AD, O2 remained >90% with this as well. Mild increase in work of breathing, however much improved since last session with this therapist. Performed Berg Balance Scale as detailed below and explained significance of results to pt. O2 monitored during 2 min of static stance, stayed >95% on RA. Ambulated 150' to day room w/ supervision using RW, NuStep 5 min @ level 3 for global endurance and strengthening. Ambulated back to room, 200' w/ supervision using RW. Ended session on toilet, agreeable to call RN prior to standing up from toilet. Made RN aware.   Therapy Documentation Precautions:  Precautions Precautions: Fall Precaution Comments: watch HR, 02 Restrictions Weight Bearing Restrictions: No Balance: Standardized Balance Assessment Standardized Balance Assessment: Berg Balance Test Berg Balance Test Sit to Stand: Able to stand without using hands and stabilize independently Standing Unsupported: Able to stand safely 2 minutes Sitting with Back Unsupported but Feet Supported on Floor or Stool: Able to sit safely and securely 2 minutes Stand to Sit: Sits safely with minimal use of hands Transfers: Able to transfer safely, minor use of hands Standing Unsupported with Eyes Closed: Able to stand 10 seconds safely Standing Ubsupported with Feet Together: Able to place feet together independently and stand for 1 minute  with supervision From Standing, Reach Forward with Outstretched Arm: Can reach forward >12 cm safely (5") From Standing Position, Pick up Object from Floor: Able to pick up shoe, needs supervision From Standing Position, Turn to Look Behind Over each Shoulder: Turn sideways only but maintains balance(per pt, this is his baseline) Turn 360 Degrees: Able to turn 360 degrees safely one side only in 4 seconds or less Standing Unsupported, Alternately Place Feet on Step/Stool: Able to complete 4 steps without aid or supervision Standing Unsupported, One Foot in Front: Able to take small step independently and hold 30 seconds Standing on One Leg: Able to lift leg independently and hold equal to or more than 3 seconds Total Score: 44  Therapy/Group: Individual Therapy  Dandrea Medders K Aleya Durnell 03/09/2019, 10:14 AM

## 2019-03-09 NOTE — Progress Notes (Signed)
Occupational Therapy Session Note  Patient Details  Name: Preston Hill MRN: [id number] Date of Birth: 08-24-1934  Today's Date: 03/09/2019 OT Individual Time: 6415-8309 OT Individual Time Calculation (min): 58 min   Short Term Goals: Week 1:  OT Short Term Goal 1 (Week 1): STG=LTG 2/2 ELOS  Skilled Therapeutic Interventions/Progress Updates:    Pt greeted in w/c with no c/o pain. Agreeable to shower. He completed toileting (sit<stand from standard toilet, continent B+B void), bathing (sit<stand in shower), dressing (EOB, sit<stand with device), and oral care (sitting at sink) during session. All functional transfers completed at ambulatory level using device with supervision. Min vcs for DME mgt and hand placement during transfers. Pt used LH sponge to reach feet in shower. Supervision for dynamic balance when completing perihygiene. We incorporated reacher use when drying off his legs in seated. Pt able thread pants without reacher. Seated figure 4 for donning Lt gripper sock. A stool was utilized for pt to don the Rt one with supervision. He transferred to w/c placed at sink and completed oral care/grooming tasks while seated. Discussed DME needs for home. Pt left in w/c at end of tx. Call bell placed on bed and pt able to self propel over to reach it. Chair alarm set. Tx focus placed on ADL retraining, functional transfers, and dynamic standing balance.   Therapy Documentation Precautions:  Precautions Precautions: Fall Precaution Comments: watch HR, 02 Restrictions Weight Bearing Restrictions: No Vital Signs: Therapy Vitals Temp: 97.7 F (36.5 C) Pulse Rate: 69 Resp: 19 BP: 113/74 Patient Position (if appropriate): Sitting Oxygen Therapy SpO2: 96 % O2 Device: Room Air Pain: No c/o pain during tx   ADL: ADL Eating: Set up Grooming: Supervision/safety Upper Body Bathing: Setup Lower Body Bathing: Moderate assistance Upper Body Dressing: Minimal assistance Lower Body  Dressing: Maximal assistance Toileting: Minimal assistance Toilet Transfer: Minimal assistance Toilet Transfer Method: Ambulating Tub/Shower Transfer: Unable to assess     Therapy/Group: Individual Therapy  Preston Hill A Preston Hill 03/09/2019, 4:09 PM

## 2019-03-09 NOTE — Progress Notes (Signed)
[facility] PHYSICAL MEDICINE & REHABILITATION PROGRESS NOTE  Subjective/Complaints: Patient seen sitting up in his chair eating breakfast this morning.  He states he slept well overnight.  He states he had a good weekend.  He states he is not requiring supplemental oxygen at present.  ROS: Denies CP, shortness of breath, nausea, vomiting, diarrhea, fevers, chills.  Objective: Vital Signs: Blood pressure 136/89, pulse 74, temperature 98 F (36.7 C), temperature source Oral, resp. rate 12, height 5\' 6"  (1.676 m), weight 82.5 kg, SpO2 95 %. No results found. No results for input(s): WBC, HGB, HCT, PLT in the last 72 hours. No results for input(s): NA, K, CL, CO2, GLUCOSE, BUN, CREATININE, CALCIUM in the last 72 hours.  Physical Exam: BP 136/89 (BP Location: Left Arm)   Pulse 74   Temp 98 F (36.7 C) (Oral)   Resp 12   Ht 5\' 6"  (1.676 m)   Wt 82.5 kg   SpO2 95%   BMI 29.36 kg/m  Constitutional: No distress . Vital signs reviewed. HENT: Normocephalic.  Atraumatic. Eyes: EOMI.  No discharge. Cardiovascular: No JVD. Respiratory: Normal effort.   GI: Non-distended. Musc: No edema or tenderness in extremities. Neurological: Alert. Reasonable insight and awareness.  Speech clear.  Motor: RUE: Grossly 5/5 throughout, stable Skin: Skin iswarm. Noerythema.  Psychiatric: He has anormal mood and affect. Hisbehavior is normal.  Assessment/Plan: 1. 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  Physiatrist is providing close team supervision and 24 hour management of active medical problems listed below.  Physiatrist and rehab team continue to assess barriers to discharge/monitor patient progress toward functional and medical goals  Care Tool:  Bathing    Body parts bathed by patient: Right arm, Chest, Left arm, Abdomen, Front  perineal area, Right upper leg, Buttocks, Face, Left upper leg, Right lower leg, Left lower leg   Body parts bathed by helper: Left lower leg, Right lower leg     Bathing assist Assist Level: Supervision/Verbal cueing(using LH sponge)     Upper Body Dressing/Undressing Upper body dressing   What is the patient wearing?: Pull over shirt    Upper body assist Assist Level: Set up assist    Lower Body Dressing/Undressing Lower body dressing      What is the patient wearing?: Pants, Incontinence brief     Lower body assist Assist for lower body dressing: Minimal Assistance - Patient > 75%     Toileting Toileting    Toileting assist Assist for toileting: Supervision/Verbal cueing     Transfers Chair/bed transfer  Transfers assist     Chair/bed transfer assist level: Supervision/Verbal cueing     Locomotion Ambulation   Ambulation assist      Assist level: Supervision/Verbal cueing Assistive device: Walker-rolling Max distance: 150 ft   Walk 10 feet activity   Assist     Assist level: Supervision/Verbal cueing Assistive device: Walker-rolling   Walk 50 feet activity   Assist    Assist level: Supervision/Verbal cueing Assistive device: Walker-rolling    Walk 150 feet activity   Assist Walk 150 feet activity did not occur: Safety/medical concerns  Assist level: Supervision/Verbal cueing Assistive device: Walker-rolling    Walk 10 feet on uneven surface  activity   Assist Walk 10 feet on uneven surfaces activity did not occur: Safety/medical concerns         Wheelchair     Assist Will  patient use wheelchair at discharge?: No   Wheelchair activity did not occur: N/A         Wheelchair 50 feet with 2 turns activity    Assist    Wheelchair 50 feet with 2 turns activity did not occur: N/A       Wheelchair 150 feet activity     Assist Wheelchair 150 feet activity did not occur: N/A          Medical Problem  List and Plan: 1.Functional and mobility deficitssecondary to left acute on chronic fronto parietal SDH  Continue CIR  Weekend notes reviewed-stable 2. Antithrombotics: -DVT/anticoagulation:Mechanical:Sequential compression devices, below kneeBilateral lower extremities  Dopplers negative for DVT -antiplatelet therapy: Off ASA 3. Pain Management:Hydrocodone prn. 4. Mood:LCSW to follow for evaluation and support. -antipsychotic agents: N/A 5. Neuropsych: This patientiscapable of making decisions onhisown behalf. 6. Skin/Wound Care:Monitor incision for healing. 7. Fluids/Electrolytes/Nutrition:Monitor I/Os 8. New onset seizures/Status epilepticus?: On Keppra 1000 mg bid and Dilantin 100 mg tid.   Last phenytoin level low on 6/1 9. OSA: Encourage compliance with CPAP 10. BPH: Resumed Flomax --was on 0.8 mg/HS.  PVRs unremarkable 11. ?CKD: Improved with IVF for hydration. Has history of peripheral edema--d/c IVF to avoid overload.  Cr 0.81 on 6/2 13. HTN: Monitor BP tid--continue to hold Norvasc, Lisinopril and propranolol (question for tremors).  Metoprolol increased to 25 twice daily on 6/4, increased to 37.5 on 6/5  Controlled on 6/8  Monitor with increased mobility 14. Hypokalemia:  Supplement added on 6/5  Potassium 3.4 on 6/5  Cont to monitor 15. New onset A fib: Monitor HR bid.   Metoprolol increased to 25 mg bid on 6/4, increased again on 6/5  Controlled on 6/8 16. Hypoxia: Encourage IS.   Wean supplemental oxygen as tolerated, continues to require.   CXR reviewed, unremarkable for infection 17. Hypoalbuminemia  Supplement initiated on 6/2 18. Fever: Resolved  Started on IV Vanc, DC'd  Blood cultures no growth to date on 6/5  UA likely neg, Ucx less than 40,000  CXR unremarkable for infection 19. ABLA  Hb 11.1 on 6/5, labs pending  Cont to monitor 20.  Prediabetes  Hemoglobin A1c 5.7 on 6/5  Labile on 6/5 21.   Hypomagnesemia  Magnesium 1.5 on 6/5  Daily supplementation started on 6/5  Will continue to monitor  LOS: 7 days A FACE TO FACE EVALUATION WAS PERFORMED  Chelsi Warr Lorie Phenix 03/09/2019, 8:32 AM

## 2019-03-09 NOTE — Plan of Care (Signed)
Min assist with adls and toileting. Oob to br per walker with standby. No dyspnea observed today, remains on RA.

## 2019-03-09 NOTE — Progress Notes (Signed)
Speech Language Pathology Daily Session Note  Patient Details  Name: Preston Hill MRN: [id number] Date of Birth: October 19, 1933  Today's Date: 03/09/2019 SLP Individual Time: 1315-1410 SLP Individual Time Calculation (min): 55 min  Short Term Goals: Week 1: SLP Short Term Goal 1 (Week 1): Pt will utilize external memory aids and compensatory strategies to recall new, daily information with mod A verbal cues. SLP Short Term Goal 2 (Week 1): Pt will complete semi-complex functional problem solving tasks with min A verbal cues. SLP Short Term Goal 3 (Week 1): Pt will utilize word finding strategies at sentence level with supervision A verbal cues. SLP Short Term Goal 4 (Week 1): Pt will self-monitor and self-correct functional errors with min A verbal cues during problem solving tasks.  Skilled Therapeutic Interventions: Skilled treatment session focused on cognitive goals. SLP facilitated session by providing Mod  A verbal cues for recall of his current medications and their functions. However, patient organized a BID pill box with supervision verbal cues for problem solving. Patient participated in a mildly complex and abstract conversation (flounder and fishing laws) with Mod I for word-finding. Patient left upright in wheelchair with alarm on and all needs within reach. Continue with current plan of care.      Pain No/Denies Pain   Therapy/Group: Individual Therapy  Bellanie Matthew 03/09/2019, 2:24 PM

## 2019-03-09 NOTE — Progress Notes (Signed)
Social Work Patient ID: Preston Hill, male   DOB: 11-25-1933, 85 y.o.   MRN: [id number]   Alerted by therapy team that pt making good gains the past few days and they feel he will meet supervision goals sooner and recommend earlier d/c date of Wed 6/10.  MD aware and agreeable.  Discussed with pt and son and they are VERY agreeable.  Have planned for family to be here on Wed at 10am to complete family education prior to d/c that same day.  [name], LCSW

## 2019-03-10 ENCOUNTER — Inpatient Hospital Stay (HOSPITAL_COMMUNITY): Payer: Medicare HMO

## 2019-03-10 ENCOUNTER — Inpatient Hospital Stay (HOSPITAL_COMMUNITY): Payer: Medicare HMO | Admitting: Physical Therapy

## 2019-03-10 ENCOUNTER — Inpatient Hospital Stay (HOSPITAL_COMMUNITY): Payer: Medicare HMO | Admitting: Occupational Therapy

## 2019-03-10 NOTE — Discharge Summary (Signed)
Physician Discharge Summary  Patient ID: Preston Hill MRN: [id number] DOB/AGE: 1934/04/12 85 y.o.  Admit date: 03/02/2019 Discharge date: 03/11/2019  Discharge Diagnoses:  Principal Problem:   SDH (subdural hematoma) (HCC) Active Problems:   Seizures (HCC)   Benign prostatic hyperplasia   Benign essential HTN   New onset atrial fibrillation (HCC)   Hypoalbuminemia due to protein-calorie malnutrition (HCC)   Acute blood loss anemia   Labile blood pressure   Prediabetes   Discharged Condition:  Stable   Significant Diagnostic Studies: Dg Chest 2 View  Result Date: 03/02/2019 CLINICAL DATA:  Hypoxia.  Cough. EXAM: CHEST - 2 VIEW COMPARISON:  Radiographs of Feb 17, 2010. FINDINGS: Stable cardiomegaly. No pneumothorax is noted. Minimal bibasilar subsegmental atelectasis is noted with minimal pleural effusions. Bony thorax is unremarkable. IMPRESSION: Minimal bibasilar subsegmental atelectasis with minimal pleural effusions. Electronically Signed   By: Marijo Conception M.D.   On: 03/02/2019 19:54   Dg Swallowing Func-speech Pathology  Result Date: 02/24/2019 Objective Swallowing Evaluation: Type of Study: MBS-Modified Barium Swallow Study  Patient Details Name: Preston Hill MRN: [id number] Date of Birth: December 21, 1933 Today's Date: 02/24/2019 Time: SLP Start Time (ACUTE ONLY): 7169 -SLP Stop Time (ACUTE ONLY): 1455 SLP Time Calculation (min) (ACUTE ONLY): 17 min Past Medical History: Past Medical History: Diagnosis Date . Arthritis  . Cancer (Man)   Skin Cancer . Chronic kidney disease  . Hypertension  . Occasional tremors  . Sleep apnea   OSA---USE C-PAP . Subdural hematoma ([place]) 02/21/2019 . Tinnitus of both ears  Past Surgical History: Past Surgical History: Procedure Laterality Date . BURR HOLE Left 02/21/2019  Procedure: BURR HOLES, Placement of Subdural drain;  Surgeon: Earnie Larsson, MD;  Location: [place];  Service: Neurosurgery;  Laterality: Left; . COLONOSCOPY  3817/2006 . COLONOSCOPY WITH  PROPOFOL N/A 02/01/2016  Procedure: COLONOSCOPY WITH PROPOFOL;  Surgeon: Robert Bellow, MD;  [facility] ENDOSCOPY;  Service: Endoscopy;  Laterality: N/A; . EYE SURGERY Right   Cataract Extraction with IOL . HERNIA REPAIR Left 1964  Inguinal Hernia Repair . KNEE ARTHROSCOPY Left  . KNEE ARTHROSCOPY Right 06/26/2017  Procedure: ARTHROSCOPY KNEE WITH DEBRIDEMENT AND LOOSE BODY REMOVAL;  Surgeon: Corky Mull, MD;  Location: [place];  Service: Orthopedics;  Laterality: Right; . KNEE ARTHROSCOPY WITH MEDIAL MENISECTOMY Left 05/02/2017  Procedure: KNEE ARTHROSCOPY WITH MEDIAL MENISECTOMY;  Surgeon: Corky Mull, MD;  Location: [facility] ORS;  Service: Orthopedics;  Laterality: Left; . KNEE ARTHROSCOPY WITH MENISCAL REPAIR  05/02/2017  Procedure: KNEE ARTHROSCOPY WITH MENISCAL REPAIR;  Surgeon: Corky Mull, MD;  Location: [facility] ORS;  Service: Orthopedics;; . SEPTOPLASTY  2003 . uvul  2004  repair HPI: 85 y/o who presented with increasing R-sided weakness from large L chronic subdural hematoma s/p evacuation 5/23. He developed seizure activity post-op and also on 5/24. PMH: Arthritis, Cancer, CKD, HTN, Occasional tremors, OSA, and Tinnitus of both ears.  Subjective: pt drowsy, needs cues Assessment / Plan / Recommendation CHL IP CLINICAL IMPRESSIONS 02/24/2019 Clinical Impression Pt exhibited functional oral and pharyngeal swallowing ability with suspected primary esophageal dysphagia. He did exhibit mild vallecular residue with solid texture with minimal impact to function. Timing and protective features of pharyngeal/laryngeal swallow were intact without penetration or aspiration although he continued to throat clear. Esophagus scanned revealing slower transit through lower esophageal sphincter which may be the cause or contribute to frequent throat clearing. Recommend regular texture, thin liquids, pills with water and esophageal precautions. ST will continue to follow  short term for education re: esophageal  strategies . SLP Visit Diagnosis Dysphagia, unspecified (R13.10) Attention and concentration deficit following -- Frontal lobe and executive function deficit following -- Impact on safety and function Mild aspiration risk   CHL IP TREATMENT RECOMMENDATION 02/24/2019 Treatment Recommendations Therapy as outlined in treatment plan below   Prognosis 02/24/2019 Prognosis for Safe Diet Advancement Good Barriers to Reach Goals -- Barriers/Prognosis Comment -- CHL IP DIET RECOMMENDATION 02/24/2019 SLP Diet Recommendations Regular solids;Thin liquid Liquid Administration via Cup;Straw Medication Administration Whole meds with liquid Compensations Small sips/bites;Slow rate Postural Changes Seated upright at 90 degrees;Remain semi-upright after after feeds/meals (Comment)   CHL IP OTHER RECOMMENDATIONS 02/24/2019 Recommended Consults -- Oral Care Recommendations Oral care BID Other Recommendations --   CHL IP FOLLOW UP RECOMMENDATIONS 02/24/2019 Follow up Recommendations Inpatient Rehab   CHL IP FREQUENCY AND DURATION 02/24/2019 Speech Therapy Frequency (ACUTE ONLY) min 1 x/week Treatment Duration 1 week      CHL IP ORAL PHASE 02/24/2019 Oral Phase WFL Oral - Pudding Teaspoon -- Oral - Pudding Cup -- Oral - Honey Teaspoon -- Oral - Honey Cup -- Oral - Nectar Teaspoon -- Oral - Nectar Cup -- Oral - Nectar Straw -- Oral - Thin Teaspoon -- Oral - Thin Cup -- Oral - Thin Straw -- Oral - Puree -- Oral - Mech Soft -- Oral - Regular -- Oral - Multi-Consistency -- Oral - Pill -- Oral Phase - Comment --  CHL IP PHARYNGEAL PHASE 02/24/2019 Pharyngeal Phase WFL Pharyngeal- Pudding Teaspoon -- Pharyngeal -- Pharyngeal- Pudding Cup -- Pharyngeal -- Pharyngeal- Honey Teaspoon -- Pharyngeal -- Pharyngeal- Honey Cup -- Pharyngeal -- Pharyngeal- Nectar Teaspoon -- Pharyngeal -- Pharyngeal- Nectar Cup -- Pharyngeal -- Pharyngeal- Nectar Straw -- Pharyngeal -- Pharyngeal- Thin Teaspoon -- Pharyngeal -- Pharyngeal- Thin Cup -- Pharyngeal -- Pharyngeal-  Thin Straw -- Pharyngeal -- Pharyngeal- Puree -- Pharyngeal -- Pharyngeal- Mechanical Soft -- Pharyngeal -- Pharyngeal- Regular -- Pharyngeal -- Pharyngeal- Multi-consistency -- Pharyngeal -- Pharyngeal- Pill -- Pharyngeal -- Pharyngeal Comment --  CHL IP CERVICAL ESOPHAGEAL PHASE 02/24/2019 Cervical Esophageal Phase WFL Pudding Teaspoon -- Pudding Cup -- Honey Teaspoon -- Honey Cup -- Nectar Teaspoon -- Nectar Cup -- Nectar Straw -- Thin Teaspoon -- Thin Cup -- Thin Straw -- Puree -- Mechanical Soft -- Regular -- Multi-consistency -- Pill -- Cervical Esophageal Comment -- [place] Siren 02/24/2019, 5:05 PM  Orbie Pyo Colvin Caroli.Ed Actor Pager [phone] Office [phone]             Vas [place] Lower Extremity Venous (dvt)  Result Date: 03/03/2019  Lower Venous Study Indications: Edema.  Performing Technologist: Abram Sander RVS  Examination Guidelines: A complete evaluation includes B-mode imaging, spectral Doppler, color Doppler, and power Doppler as needed of all accessible portions of each vessel. Bilateral testing is considered an integral part of a complete examination. Limited examinations for reoccurring indications may be performed as noted.  +---------+---------------+---------+-----------+----------+--------------+ RIGHT    CompressibilityPhasicitySpontaneityPropertiesSummary        +---------+---------------+---------+-----------+----------+--------------+ CFV      Full           Yes      Yes                                 +---------+---------------+---------+-----------+----------+--------------+ SFJ      Full                                                        +---------+---------------+---------+-----------+----------+--------------+  FV Prox  Full                                                        +---------+---------------+---------+-----------+----------+--------------+ FV Mid   Full                                                         +---------+---------------+---------+-----------+----------+--------------+ FV DistalFull                                                        +---------+---------------+---------+-----------+----------+--------------+ PFV      Full                                                        +---------+---------------+---------+-----------+----------+--------------+ POP      Full           Yes      Yes                                 +---------+---------------+---------+-----------+----------+--------------+ PTV      Full                                                        +---------+---------------+---------+-----------+----------+--------------+ PERO                                                  Not visualized +---------+---------------+---------+-----------+----------+--------------+   +---------+---------------+---------+-----------+----------+--------------+ LEFT     CompressibilityPhasicitySpontaneityPropertiesSummary        +---------+---------------+---------+-----------+----------+--------------+ CFV      Full           Yes      Yes                                 +---------+---------------+---------+-----------+----------+--------------+ SFJ      Full                                                        +---------+---------------+---------+-----------+----------+--------------+ FV Prox  Full                                                        +---------+---------------+---------+-----------+----------+--------------+  FV Mid   Full                                                        +---------+---------------+---------+-----------+----------+--------------+ FV DistalFull                                                        +---------+---------------+---------+-----------+----------+--------------+ PFV      Full                                                         +---------+---------------+---------+-----------+----------+--------------+ POP      Full           Yes      Yes                                 +---------+---------------+---------+-----------+----------+--------------+ PTV      Full                                                        +---------+---------------+---------+-----------+----------+--------------+ PERO                                                  Not visualized +---------+---------------+---------+-----------+----------+--------------+     Summary: Right: There is no evidence of deep vein thrombosis in the lower extremity. No cystic structure found in the popliteal fossa. Left: There is no evidence of deep vein thrombosis in the lower extremity. No cystic structure found in the popliteal fossa.  *See table(s) above for measurements and observations. Electronically signed by Deitra Mayo MD on 03/03/2019 at 4:52:19 PM.    Final     Labs:  Basic Metabolic Panel: BMP Latest Ref Rng & Units 03/11/2019 03/06/2019 03/03/2019  Glucose 70 - 99 mg/dL 109(H) 119(H) 109(H)  BUN 8 - 23 mg/dL 21 21 21   Creatinine 0.61 - 1.24 mg/dL 0.89 0.83 0.81  Sodium 135 - 145 mmol/L 138 137 137  Potassium 3.5 - 5.1 mmol/L 3.9 3.4(L) 3.5  Chloride 98 - 111 mmol/L 102 101 98  CO2 22 - 32 mmol/L 25 21(L) 27  Calcium 8.9 - 10.3 mg/dL 8.8(L) 8.7(L) 8.5(L)    CBC: CBC Latest Ref Rng & Units 03/09/2019 03/06/2019 03/03/2019  WBC 4.0 - 10.5 K/uL 5.9 8.6 6.9  Hemoglobin 13.0 - 17.0 g/dL 10.9(L) 11.1(L) 11.5(L)  Hematocrit 39.0 - 52.0 % 32.3(L) 32.5(L) 32.6(L)  Platelets 150 - 400 K/uL 292 237 155    CBG: No results for input(s): GLUCAP in the last 168 hours.  Brief HPI:   Preston Hill is an 85 year old male with history of OSA, HTN, BPH, chronic tinnitus who  was admitted on 02/21/2019 with 2 to 3-day history of headaches progressing to right-sided weakness and difficulty walking.  He was found to have acute on chronic left SDH with  left-to-right shift and effacement of frontal and parietal lobes.  He was taken to the OR the same day for bur hole evacuation with placement of subdural drain by Dr. Trenton Gammon.  Postop developed seizures requiring Dilantin as well as Keppra.  Keppra was increased to thousand milligrams twice daily due to ongoing left upper extremity motor activity.  Follow-up CT head showed decrease in mass-effect and lethargy has resolved.  Patient continued to have issues with tachycardia and was found to have new onset A. fib requiring IV Cardizem for rate control.  Cardiology was consulted for input and recommended low-dose metoprolol for rate control with close monitoring of heart rate as well as addition of Eliquis when cleared by neurosurgery.  He is to follow-up with Dr. Lovena Le past discharge.  Patient continued to be limited by visual deficits with shuffling gait and hypoxia affecting overall functional status.  CIR was recommended for follow-up therapy   Physical Exam  Constitutional: He is oriented to person, place, and time. He appears well-developed and well-nourished. No distress.  HENT:  Head: Normocephalic and atraumatic.  Mouth/Throat: Oropharynx is clear and moist.  Left crani incision C/D/I  Eyes: Pupils are equal, round, and reactive to light. Conjunctivae and EOM are normal. Right eye exhibits no discharge. Left eye exhibits no discharge.  Neck: Normal range of motion. Neck supple.  Cardiovascular: Normal rate. An irregularly irregular rhythm present.  Pulmonary/Chest: Effort normal and breath sounds normal. No stridor. No respiratory distress. He has no wheezes.  Abdominal: Bowel sounds are normal. He exhibits no distension. There is no abdominal tenderness.  Musculoskeletal:        General: No tenderness or edema.  Neurological: He is alert and oriented to person, place, and time.  Skin: Skin is warm and dry. No rash noted. He is not diaphoretic. No erythema.  Psychiatric: He has a normal mood  and affect. His behavior is normal. Thought content normal.  Nursing note and vitals reviewed.    Hospital Course: OSBORN PULLIN was admitted to rehab 03/02/2019 for inpatient therapies to consist of PT, ST and OT at least three hours five days a week. Past admission physiatrist, therapy team and rehab RN have worked together to provide customized collaborative inpatient rehab.  Left frontoparietal incision is healing well without any signs or symptoms of infection.  His blood pressures and heart rate were monitored on twice daily basis and metoprolol was increased to 37.5 mg twice daily without evidence of bradycardia.  Due to history of BPH Flomax was resumed and he is voiding without difficulty. He developed fever of 101.4 past admission and was started on IV vancomycin due to concerns of urosepsis.  BLE dopplers were negative for DVT. UCS/CXR were negative therefore Vancomycin was discontinued on 6/2. Follow up labs revealed recurrent hypokalemia and hypomagnesemia. K dur was added for supplement and Mag Ox added and titrated upwards with resolution.   Serial CBC showed ABLA without signs of bleeding. He has been weaned off oxygen and respiratory status is stable.  He has been compliant with CPAP use. Nutritional supplement was added due to low calorie malnutrition. His fasting BS revealed stress induced hyperglycemia with BS in 120's. Hgb A1C showed evidence of prediabetes and patient advised to limit carb intake.  Dilantin had been discontinued prior to admission and he has  been seizure free on Keppra bid. He has made good gains during his rehab stay and currently at supervision level. He will continue to receive follow up HHPT, [place] and HHST by [facility] after discharge.    Rehab course: During patient's stay in rehab weekly team conferences were held to monitor patient's progress, set goals and discuss barriers to discharge. At admission, patient required mod assist with basic self-care  tasks and min assist with mobility. He exhibited mild to moderate deficits affecting processing as well as higher level word finding deficits.   He   has had improvement in activity tolerance, balance, postural control as well as ability to compensate for deficits.    He requires supervision for transfers and to ambulate 150' with RW.  He requires min assist with higher level cognitive tasks due to delayed processing.  He is showing improvement in emergent and anticipatory awareness as well as word finding strategies for daily conversation. Family education was completed regarding all aspects of care and safety.    Disposition: Home  Diet: Heart healthy/monitor carb intake  Special Instructions: 1.  Repeat CBC in 1 to 2 weeks 2. Per [place] law have to be seizure free for 6 months prior to resuming driving. Will need to follow up with NS or neurology for input prior to resuming driving.  3. Will need CBC/BMET/Mg levels rechecked in 1-2 weeks.   Discharge Instructions    Ambulatory referral to Cardiology   Complete by: As directed    SDH/A fib--off eliquis   Ambulatory referral to Physical Medicine Rehab   Complete by: As directed     3.  Allergies as of 03/11/2019      Reactions   Codeine Anxiety   Sick      Medication List    STOP taking these medications   amLODipine 5 MG tablet Commonly known as: NORVASC   aspirin EC 81 MG tablet   Glucosamine Sulfate 1000 MG Caps   ibuprofen 200 MG tablet Commonly known as: ADVIL   lisinopril 20 MG tablet Commonly known as: ZESTRIL   propranolol 40 MG tablet Commonly known as: INDERAL   vitamin E 400 UNIT capsule Generic drug: vitamin E     TAKE these medications   acetaminophen 325 MG tablet Commonly known as: TYLENOL Take 1-2 tablets (325-650 mg total) by mouth every 4 (four) hours as needed for mild pain. What changed:   medication strength  how much to take  when to take this  reasons to take this   levETIRAcetam 1000  MG tablet Commonly known as: KEPPRA Take 1 tablet (1,000 mg total) by mouth 2 (two) times daily. Notes to patient: To prevent seizures   magnesium oxide 400 (241.3 Mg) MG tablet Commonly known as: MAG-OX Take 1 tablet (400 mg total) by mouth daily.   Metoprolol Tartrate 37.5 MG Tabs Take 37.5 mg by mouth 2 (two) times daily. Notes to patient: For A fib   pantoprazole 40 MG tablet Commonly known as: PROTONIX Take 1 tablet (40 mg total) by mouth daily. Notes to patient: Reflux/indigestion   potassium chloride SA 20 MEQ tablet Commonly known as: K-DUR Take 1 tablet (20 mEq total) by mouth daily. What changed: when to take this Notes to patient: Decreased to once a day   tamsulosin 0.4 MG Caps capsule Commonly known as: FLOMAX Take 0.8 mg by mouth at bedtime.      Follow-up Information    Albina Billet, MD Follow up on 03/19/2019.  Specialty: Internal Medicine Why: @ 10:15 am (hospital follow up appt). Will need CBC/BMET rechecked.  Contact information: [address] [place] [address] [phone]        Jamse Arn, MD Follow up.   Specialty: Physical Medicine and Rehabilitation Why: Office will call you with follow up appointment Contact information: [address] [place] [address] [phone]        Earnie Larsson, MD. Call on 03/12/2019.   Specialty: Neurosurgery Why: for post op appointment Contact information: [address] [place] [address] [phone]        Evans Lance, MD. Call on 03/25/2019.   Specialty: Cardiology Why: at 8:20 am for follow up on A fib.  Contact information: [address] [place] [address] [phone]           Signed: Bary Leriche 03/12/2019, 5:37 PM

## 2019-03-10 NOTE — Progress Notes (Signed)
Physical Therapy Discharge Summary  Patient Details  Name: BANDY HONAKER MRN: [id number] Date of Birth: November 09, 1933  Today's Date: 03/11/2019 PT Individual Time: 5427-0623 PT Individual Time Calculation (min): 40 min   Pt in w/c and agreeable to therapy, no c/o pain. Pt's wife and son present for caregiver education. Educated wife on providing supervision level assist for all mobility including bed mobility, gait, stair negotiation, functional transfers, and car transfer. Discussed importance of RW use for balance and endurance deficits, especially when in community. Educated on energy conservation strategies as well as verbally discussed floor transfer technique. Cautioned w/ gait on uneven surfaces, especially grass, as pt unsafe at this time to perform w/o supervision from a therapist. Wife and son verbalized understanding of all education and in agreement. Returned to room and ended session in w/c, in care of family.   Patient has met 10 of 10 long term goals due to improved activity tolerance, improved balance, increased strength, improved attention and improved awareness.  Patient to discharge at an ambulatory level Supervision.   Patient's care partner is independent to provide the necessary physical assistance at discharge.  Reasons goals not met: n/a  Recommendation:  Patient will benefit from ongoing skilled PT services in home health setting to continue to advance safe functional mobility, address ongoing impairments in functional balance and endurance, and minimize fall risk.  Equipment: already has RW at home  Reasons for discharge: treatment goals met and discharge from hospital  Patient/family agrees with progress made and goals achieved: Yes  PT Discharge Precautions/Restrictions Precautions Precautions: Fall Restrictions Weight Bearing Restrictions: No Vital Signs Therapy Vitals Temp: (!) 97.4 F (36.3 C) Pulse Rate: 82 Resp: (!) 21 BP: (!) 149/93 Patient  Position (if appropriate): Sitting Oxygen Therapy SpO2: 97 % O2 Device: Room Air Pain Pain Assessment Pain Score: 0-No pain Vision/Perception  Perception Perception: Within Functional Limits Praxis Praxis: Intact  Cognition Overall Cognitive Status: Impaired/Different from baseline Arousal/Alertness: Awake/alert Orientation Level: Oriented X4 Attention: Sustained Sustained Attention: Appears intact Memory: Impaired Memory Impairment: Storage deficit;Retrieval deficit Awareness: Impaired Awareness Impairment: Emergent impairment;Anticipatory impairment Problem Solving: Impaired Problem Solving Impairment: Functional basic;Verbal basic(semi-complex) Executive Function: Self Monitoring;Self Correcting Safety/Judgment: Appears intact Sensation Sensation Light Touch: Appears Intact Coordination Gross Motor Movements are Fluid and Coordinated: Yes Fine Motor Movements are Fluid and Coordinated: Yes Motor  Motor Motor: Within Functional Limits Motor - Discharge Observations: poor muscular endurance  Mobility Bed Mobility Bed Mobility: Rolling Right;Rolling Left;Supine to Sit;Sit to Supine Rolling Right: Supervision/verbal cueing Rolling Left: Supervision/Verbal cueing Supine to Sit: Supervision/Verbal cueing Sit to Supine: Supervision/Verbal cueing Transfers Transfers: Sit to Stand;Stand to Sit;Stand Pivot Transfers Sit to Stand: Supervision/Verbal cueing Stand to Sit: Supervision/Verbal cueing Stand Pivot Transfers: Supervision/Verbal cueing Transfer (Assistive device): None Locomotion  Gait Ambulation: Yes Gait Assistance: Supervision/Verbal cueing Gait Distance (Feet): 150 Feet Assistive device: Rolling walker Gait Gait: Yes Gait Pattern: Impaired Gait Pattern: Shuffle;Decreased step length - left;Decreased step length - right;Trunk flexed;Poor foot clearance - right Gait velocity: decreased, although improved since eval Stairs / Additional Locomotion Stairs:  Yes Stairs Assistance: Supervision/Verbal cueing Stair Management Technique: One rail Right Number of Stairs: 4 Height of Stairs: 6 Curb: Supervision/Verbal cueing Wheelchair Mobility Wheelchair Mobility: No  Trunk/Postural Assessment  Cervical Assessment Cervical Assessment: Within Functional Limits Thoracic Assessment Thoracic Assessment: Exceptions to WFL(rounded shoulders) Lumbar Assessment Lumbar Assessment: Exceptions to WFL(posterior pelvic tilt) Postural Control Postural Control: Within Functional Limits  Balance Balance Balance Assessed: Yes Static Sitting Balance Static Sitting - Balance  Support: Feet supported;No upper extremity supported Static Sitting - Level of Assistance: 5: Stand by assistance Dynamic Sitting Balance Dynamic Sitting - Balance Support: No upper extremity supported;Feet supported Dynamic Sitting - Level of Assistance: 5: Stand by assistance Static Standing Balance Static Standing - Balance Support: During functional activity Static Standing - Level of Assistance: 5: Stand by assistance Dynamic Standing Balance Dynamic Standing - Balance Support: During functional activity;No upper extremity supported Dynamic Standing - Level of Assistance: 5: Stand by assistance   44/56 Berg 03/09/2019  Extremity Assessment  RLE Assessment RLE Assessment: Within Functional Limits LLE Assessment LLE Assessment: Within Functional Limits    Arley Salamone K Marcille Barman 03/11/2019, 7:45 AM

## 2019-03-10 NOTE — Progress Notes (Signed)
Occupational Therapy Discharge Summary  Patient Details  Name: TOBI LEINWEBER MRN: [id number] Date of Birth: [birth year]  Patient has met 62 of 10 long term goals due to improved activity tolerance, improved balance, postural control, ability to compensate for deficits, improved attention, improved awareness and improved coordination.  Patient to discharge at overall Supervision level.  Patient's care partner is independent to provide the necessary cognitive assistance at discharge.    Reasons goals not met: n/a  Recommendation:  Patient will benefit from ongoing skilled OT services in home health setting to continue to advance functional skills in the area of BADL.  Equipment: BSC  Reasons for discharge: treatment goals met and discharge from hospital  Patient/family agrees with progress made and goals achieved: Yes  OT Discharge Precautions/Restrictions  Precautions Precautions: Fall Restrictions Weight Bearing Restrictions: No General   Vital Signs Therapy Vitals Temp: (!) 97.4 F (36.3 C) Pulse Rate: 82 Resp: (!) 21 BP: (!) 149/93 Patient Position (if appropriate): Sitting Oxygen Therapy SpO2: 97 % O2 Device: Room Air Pain Pain Assessment Pain Score: 0-No pain ADL ADL Eating: Independent Grooming: Independent Upper Body Bathing: Supervision/safety Lower Body Bathing: Supervision/safety Upper Body Dressing: Setup Lower Body Dressing: Supervision/safety Toileting: Supervision/safety Toilet Transfer: Distant supervision Toilet Transfer Method: Ambulating Tub/Shower Transfer: Close supervison Tub/Shower Transfer Method: Ambulating Vision Baseline Vision/History: Wears glasses Wears Glasses: At all times Patient Visual Report: No change from baseline Vision Assessment?: No apparent visual deficits Perception  Perception: Within Functional Limits Praxis Praxis: Intact Cognition Overall Cognitive Status: Impaired/Different from baseline Arousal/Alertness:  Awake/alert Orientation Level: Oriented X4 Attention: Sustained Sustained Attention: Appears intact Memory: Impaired Memory Impairment: Storage deficit;Retrieval deficit Awareness: Impaired Awareness Impairment: Emergent impairment;Anticipatory impairment Problem Solving: Impaired Problem Solving Impairment: Functional basic;Verbal basic(semi-complex) Executive Function: Self Monitoring;Self Correcting Safety/Judgment: Impaired Sensation Sensation Light Touch: Appears Intact Hot/Cold: Appears Intact Coordination Gross Motor Movements are Fluid and Coordinated: Yes Fine Motor Movements are Fluid and Coordinated: Yes Coordination and Movement Description: WNL Finger Nose Finger Test: WNL Motor  Motor Motor: Within Functional Limits Motor - Discharge Observations: generalized weakness, improved since eval Mobility  Bed Mobility Bed Mobility: Rolling Right;Rolling Left;Supine to Sit;Sit to Supine Rolling Right: Supervision/verbal cueing Rolling Left: Supervision/Verbal cueing Supine to Sit: Supervision/Verbal cueing Sit to Supine: Supervision/Verbal cueing Transfers Sit to Stand: Supervision/Verbal cueing Stand to Sit: Supervision/Verbal cueing  Trunk/Postural Assessment  Cervical Assessment Cervical Assessment: Within Functional Limits Thoracic Assessment Thoracic Assessment: Exceptions to WFL(rounded shoulders) Lumbar Assessment Lumbar Assessment: Exceptions to WFL(posterior pelvic tilt) Postural Control Postural Control: Within Functional Limits  Balance Balance Balance Assessed: Yes Static Sitting Balance Static Sitting - Balance Support: Feet supported;No upper extremity supported Static Sitting - Level of Assistance: 7: Independent Dynamic Sitting Balance Dynamic Sitting - Balance Support: No upper extremity supported;Feet supported Dynamic Sitting - Level of Assistance: 7: Independent Static Standing Balance Static Standing - Balance Support: During functional  activity Static Standing - Level of Assistance: 5: Stand by assistance Dynamic Standing Balance Dynamic Standing - Balance Support: During functional activity Dynamic Standing - Level of Assistance: 5: Stand by assistance Extremity/Trunk Assessment RUE Assessment RUE Assessment: Within Functional Limits LUE Assessment LUE Assessment: Within Functional Limits   Daneen Schick Doe 03/10/2019, 3:49 PM

## 2019-03-10 NOTE — Progress Notes (Signed)
Occupational Therapy Session Note  Patient Details  Name: Preston Hill MRN: [id number] Date of Birth: Jul 26, 1934  Today's Date: 03/10/2019 OT Individual Time: 2182-8833 OT Individual Time Calculation (min): 70 min   Short Term Goals: Week 1:  OT Short Term Goal 1 (Week 1): STG=LTG 2/2 ELOS  Skilled Therapeutic Interventions/Progress Updates:    Pt greeted seated in wc and agreeable to OT treatment session. OT handed pt his bag and he picked out clothing. Pt needed verbal cues for safety to sit down to take pants off of feet as he tried to step out of pants. Educated pt on the high level balance this would take to stand on 1 foot and reach down at the same time. Pt states " you don't think i'm ready for that?" OT provided extensive education on fall risk and ways to reduce risk of falls within BADL tasks. Pt ambulated into bathroom, transferred onto commode, voided bowel and bladder with supervision. Toileting tasks completed supervision. PT then ambulated into shower for bathing. Verbal cues to sit down for energy conservation when showering. Re-iterated deep breathing strategies as well within BADL tasks. Pt completed dressing tasks from wc with use of reacher. Pt then took rest break, and ambulated 50 feet w. RW supervision. SpO2 at 95% and above on RA throughout session. Pt returned to room and left seated in wc with chair alarm on and needs met.   Therapy Documentation Precautions:  Precautions Precautions: Fall Precaution Comments: watch HR, 02 Restrictions Weight Bearing Restrictions: No Pain:   denies pain  Therapy/Group: Individual Therapy  Daneen Schick Smaran Gaus 03/10/2019, 10:00 AM

## 2019-03-10 NOTE — Progress Notes (Signed)
Pt stated he could place self on home cpap unit when ready for bed. Pt stated he does not use humidification.

## 2019-03-10 NOTE — Progress Notes (Addendum)
Slept well throughout the shift. No complaints noted. No prn medications noted. Pt wore CPAP throughout the night.

## 2019-03-10 NOTE — Progress Notes (Signed)
Physical Therapy Session Note  Patient Details  Name: Preston Hill MRN: [id number] Date of Birth: 1933-11-08  Today's Date: 03/10/2019 PT Individual Time: 1130-1155 AND 1430-1525 PT Individual Time Calculation (min): 25 min AND 55 min  Short Term Goals: Week 1:  PT Short Term Goal 1 (Week 1): =LTGs due to ELOS  Skilled Therapeutic Interventions/Progress Updates:   Session 1:  Pt in w/c and agreeable to therapy, no c/o pain. Session focused on functional mobility and discharge planning this session. Ambulated around unit in 100-150' bouts w/ supervision using RW. Practiced sit<>stands from recliner in ADL apartment w/ supervision and ambulated up/down 4 steps w/ R rail, as per home access, w/ supervision. Additionally discussed getting from house to/from deck, which his primary way of accessing house. Discussed negotiating that threshold w/ RW, performed w/ supervision and verbal cues for safety. Returned to room and ended session in w/c, all needs in reach.   Session 2:  Pt in w/c and agreeable to therapy, no c/o pain. Ambulated to/from day room w/ supervision using RW, 1 seated rest break each way. Ongoing education of energy conservation techniques during functional mobility as well as education on balance deficits and importance of using RW for safety. NuStep 5 min x2 @ level 3 for global endurance training. Returned to room and practiced bed mobility w/ supervision and floor transfer. Visual and verbal cues for floor transfer technique, CGA for safety but no physical assistance needed. Discussed fall safety in general including home set-up, energy conservation, use of RW, and community gait including gait in his yard and on uneven surfaces. Pt verbalized understanding and in agreement. Ambulated to toilet w/ supervision, ended session on toilet and agreeable to call RN prior to standing from toilet.   Therapy Documentation Precautions:  Precautions Precautions: Fall Precaution Comments:  watch HR, 02 Restrictions Weight Bearing Restrictions: No Pain: Pain Assessment Pain Scale: 0-10 Pain Score: 0-No pain  Therapy/Group: Individual Therapy  Keara Pagliarulo K Samariah Hokenson 03/10/2019, 12:30 PM

## 2019-03-10 NOTE — Progress Notes (Signed)
Pt resting on home CPAP.  RT will continue to monitor.

## 2019-03-10 NOTE — Progress Notes (Signed)
[facility] PHYSICAL MEDICINE & REHABILITATION PROGRESS NOTE  Subjective/Complaints: Patient seen sitting up in his chair this morning.  He states he slept well overnight.  He states he no longer requires supplemental oxygen.  He is looking forward to discharge tomorrow.  Discussed with Education officer, museum, patient progressing.  ROS: Denies CP, shortness of breath, nausea, vomiting, diarrhea, fevers, chills.  Objective: Vital Signs: Blood pressure (!) 146/95, pulse 83, temperature 98 F (36.7 C), resp. rate 19, height 5\' 6"  (1.676 m), weight 82.5 kg, SpO2 95 %. No results found. Recent Labs    03/09/19 0850  WBC 5.9  HGB 10.9*  HCT 32.3*  PLT 292   No results for input(s): NA, K, CL, CO2, GLUCOSE, BUN, CREATININE, CALCIUM in the last 72 hours.  Physical Exam: BP (!) 146/95 (BP Location: Right Arm)   Pulse 83   Temp 98 F (36.7 C)   Resp 19   Ht 5\' 6"  (1.676 m)   Wt 82.5 kg   SpO2 95%   BMI 29.36 kg/m  Constitutional: No distress . Vital signs reviewed. HENT: Normocephalic.  Atraumatic. Eyes: EOMI.  No discharge. Cardiovascular: No JVD. Respiratory: Normal effort.   GI: Non-distended. Musc: No edema or tenderness in extremities. Neurological: Alert. Reasonable insight and awareness.  Speech clear.  Motor: RUE: Grossly 5/5 throughout, unchanged Skin: Skin iswarm. Noerythema.  Psychiatric: He has anormal mood and affect. Hisbehavior is normal.  Assessment/Plan: 1. 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  Physiatrist is providing close team supervision and 24 hour management of active medical problems listed below.  Physiatrist and rehab team continue to assess barriers to discharge/monitor patient progress toward functional and medical goals  Care Tool:  Bathing    Body parts bathed by patient: Right arm, Chest,  Left arm, Abdomen, Front perineal area, Right upper leg, Buttocks, Face, Left upper leg, Right lower leg, Left lower leg   Body parts bathed by helper: Left lower leg, Right lower leg     Bathing assist Assist Level: Supervision/Verbal cueing(using LH sponge)     Upper Body Dressing/Undressing Upper body dressing   What is the patient wearing?: Pull over shirt    Upper body assist Assist Level: Set up assist    Lower Body Dressing/Undressing Lower body dressing      What is the patient wearing?: Pants, Incontinence brief     Lower body assist Assist for lower body dressing: Supervision/Verbal cueing(pt threading brief like underwear with setup)     Toileting Toileting    Toileting assist Assist for toileting: Supervision/Verbal cueing     Transfers Chair/bed transfer  Transfers assist     Chair/bed transfer assist level: Supervision/Verbal cueing     Locomotion Ambulation   Ambulation assist      Assist level: Supervision/Verbal cueing Assistive device: Walker-rolling Max distance: 150 ft   Walk 10 feet activity   Assist     Assist level: Supervision/Verbal cueing Assistive device: Walker-rolling   Walk 50 feet activity   Assist    Assist level: Supervision/Verbal cueing Assistive device: Walker-rolling    Walk 150 feet activity   Assist Walk 150 feet activity did not occur: Safety/medical concerns  Assist level: Supervision/Verbal cueing Assistive device: Walker-rolling    Walk 10 feet on uneven surface  activity   Assist Walk 10 feet on uneven surfaces activity did not occur: Safety/medical concerns  Wheelchair     Assist Will patient use wheelchair at discharge?: No   Wheelchair activity did not occur: N/A         Wheelchair 50 feet with 2 turns activity    Assist    Wheelchair 50 feet with 2 turns activity did not occur: N/A       Wheelchair 150 feet activity     Assist Wheelchair 150 feet  activity did not occur: N/A          Medical Problem List and Plan: 1.Functional and mobility deficitssecondary to left acute on chronic fronto parietal SDH  Continue CIR  Plan for d/c tomorrow  Will see patient for transitional care management in 1-2 weeks post-discharge 2. Antithrombotics: -DVT/anticoagulation:Mechanical:Sequential compression devices, below kneeBilateral lower extremities  Dopplers negative for DVT -antiplatelet therapy: Off ASA 3. Pain Management:Hydrocodone prn. 4. Mood:LCSW to follow for evaluation and support. -antipsychotic agents: N/A 5. Neuropsych: This patientiscapable of making decisions onhisown behalf. 6. Skin/Wound Care:Monitor incision for healing. 7. Fluids/Electrolytes/Nutrition:Monitor I/Os 8. New onset seizures/Status epilepticus?: On Keppra 1000 mg bid and Dilantin 100 mg tid.   Last phenytoin level low on 6/1 9. OSA: Encourage compliance with CPAP 10. BPH: Resumed Flomax --was on 0.8 mg/HS.  PVRs unremarkable 11. ?CKD: Improved with IVF for hydration. Has history of peripheral edema--d/c IVF to avoid overload.  Cr 0.81 on 6/2, labs ordered for tomorrow 13. HTN: Monitor BP tid--continue to hold Norvasc, Lisinopril and propranolol (question for tremors).  Metoprolol increased to 25 twice daily on 6/4, increased to 37.5 on 6/5  Relatively controlled on 5/9  Monitor with increased mobility 14. Hypokalemia:  Supplement added on 6/5  Potassium 3.4 on 6/5, labs ordered for tomorrow  Cont to monitor 15. New onset A fib: Monitor HR bid.   Metoprolol increased to 25 mg bid on 6/4, increased again on 6/5  Controlled on 6/8 16. Hypoxia: Encourage IS.   Weaned supplemental oxygen  CXR reviewed, unremarkable for infection 17. Hypoalbuminemia  Supplement initiated on 6/2 18. Fever: Resolved  Started on IV Vanc, DC'd  Blood cultures no growth  UA likely neg, Ucx less than 40,000  CXR unremarkable  for infection 19. ABLA  Hb 10.9 on 6/8  Cont to monitor 20.  Prediabetes  Hemoglobin A1c 5.7 on 6/5  Labile on 6/5 21.  Hypomagnesemia  Magnesium 1.5 on 6/5, labs ordered for tomorrow  Daily supplementation started on 6/5  Will continue to monitor  LOS: 8 days A FACE TO FACE EVALUATION WAS PERFORMED  Armondo Cech Lorie Phenix 03/10/2019, 8:15 AM

## 2019-03-10 NOTE — Progress Notes (Signed)
Speech Language Pathology Discharge Summary  Patient Details  Name: Preston Hill MRN: [id number] Date of Birth: Feb 08, 1934  Today's Date: 03/10/2019 SLP Individual Time: 3494-9447 SLP Individual Time Calculation (min): 45 min   Skilled Therapeutic Interventions: Skilled ST services focused on education and cognitive skills. SLP facilitated education on memory strategies providing hand out. Pt demonstrated ability to recall 3 out 5 words with 5 minute delay following strategies of repetition and visualization., compared to evaluation pt recalled 0 out 4 words. SLP facilitated semi-complex problem solving and error awareness with novel card task, pt required supervision A verbal cues. All questions answered to satisfaction.  Pt was left in room with call bell within reach and chair alarm set. ST recommends to continue skilled ST services.     Patient has met 4 of 4 long term goals.  Patient to discharge at overall Min;Supervision level.  Reasons goals not met:     Clinical Impression/Discharge Summary:   Pt made great progressing meeting 4 out 4 goals, discharging at min-supervision A.  Pt continues to demonstrate delayed processing impacting higher level tasks, further impact with mild impaired memory at baseline and current increase in hand tremors. SLP completed medication management and functional tasks. Pt demonstrated increase in emergent and anticipatory awareness, recall skills and expressive word finding strategies in daily conversation. Pt would continue to benefit from skilled ST services in order to maximize functional independence and reduce burden of care, requiring 24 hour supervision and continue ST services.  Care Partner:  Caregiver Able to Provide Assistance: Yes  Type of Caregiver Assistance: Physical;Cognitive  Recommendation:  Home Health SLP;24 hour supervision/assistance  Rationale for SLP Follow Up: Maximize cognitive function and independence;Reduce caregiver burden    Equipment: N/A   Reasons for discharge: Discharged from hospital   Patient/Family Agrees with Progress Made and Goals Achieved: Yes    Jamespaul Secrist  [facility] 03/10/2019, 12:55 PM

## 2019-03-11 ENCOUNTER — Ambulatory Visit (HOSPITAL_COMMUNITY): Payer: Medicare HMO | Admitting: Physical Therapy

## 2019-03-11 ENCOUNTER — Encounter (HOSPITAL_COMMUNITY): Payer: Medicare HMO | Admitting: Speech Pathology

## 2019-03-11 ENCOUNTER — Encounter (HOSPITAL_COMMUNITY): Payer: Medicare HMO

## 2019-03-11 LAB — BASIC METABOLIC PANEL
Anion gap: 11 (ref 5–15)
BUN: 21 mg/dL (ref 8–23)
CO2: 25 mmol/L (ref 22–32)
Calcium: 8.8 mg/dL — ABNORMAL LOW (ref 8.9–10.3)
Chloride: 102 mmol/L (ref 98–111)
Creatinine, Ser: 0.89 mg/dL (ref 0.61–1.24)
GFR calc Af Amer: >60 mL/min (ref >60)
GFR calc non Af Amer: >60 mL/min (ref >60)
Glucose, Bld: 109 mg/dL — ABNORMAL HIGH (ref 70–99)
Potassium: 3.9 mmol/L (ref 3.5–5.1)
Sodium: 138 mmol/L (ref 135–145)

## 2019-03-11 LAB — MAGNESIUM: Magnesium: 1.7 mg/dL (ref 1.7–2.4)

## 2019-03-11 MED ORDER — LEVETIRACETAM 1000 MG PO TABS: 1000.0000 mg | ORAL_TABLET | Freq: Two times a day (BID) | ORAL | 1 refills | Status: DC

## 2019-03-11 MED ORDER — MAGNESIUM OXIDE 400 (241.3 MG) MG PO TABS: 400.0000 mg | ORAL_TABLET | Freq: Every day | ORAL | 1 refills | Status: DC

## 2019-03-11 MED ORDER — PANTOPRAZOLE SODIUM 40 MG PO TBEC: 40.0000 mg | DELAYED_RELEASE_TABLET | Freq: Every day | ORAL | 1 refills | Status: AC

## 2019-03-11 MED ORDER — ACETAMINOPHEN 325 MG PO TABS: 325.0000 mg | ORAL_TABLET | ORAL | Status: AC | PRN

## 2019-03-11 MED ORDER — POTASSIUM CHLORIDE CRYS ER 20 MEQ PO TBCR: 20.0000 meq | EXTENDED_RELEASE_TABLET | Freq: Every day | ORAL | 0 refills | Status: AC

## 2019-03-11 MED ORDER — METOPROLOL TARTRATE 37.5 MG PO TABS: 37.5000 mg | ORAL_TABLET | Freq: Two times a day (BID) | ORAL | 1 refills | Status: DC

## 2019-03-11 NOTE — Progress Notes (Signed)
[facility] PHYSICAL MEDICINE & REHABILITATION PROGRESS NOTE  Subjective/Complaints: No new issues. Slept well. Good appetite  ROS: Patient denies fever, rash, sore throat, blurred vision, nausea, vomiting, diarrhea, cough, shortness of breath or chest pain, joint or back pain, headache, or mood change.    Objective: Vital Signs: Blood pressure (!) 143/93, pulse 69, temperature 97.7 F (36.5 C), temperature source Oral, resp. rate 20, height 5\' 6"  (1.676 m), weight 82.5 kg, SpO2 93 %. No results found. Recent Labs    03/09/19 0850  WBC 5.9  HGB 10.9*  HCT 32.3*  PLT 292   Recent Labs    03/11/19 0615  NA 138  K 3.9  CL 102  CO2 25  GLUCOSE 109*  BUN 21  CREATININE 0.89  CALCIUM 8.8*    Physical Exam: BP (!) 143/93 (BP Location: Right Arm)   Pulse 69   Temp 97.7 F (36.5 C) (Oral)   Resp 20   Ht 5\' 6"  (1.676 m)   Wt 82.5 kg   SpO2 93%   BMI 29.36 kg/m  Constitutional: No distress . Vital signs reviewed. HEENT: EOMI, oral membranes moist Neck: supple Cardiovascular: RRR without murmur. No JVD    Respiratory: CTA Bilaterally without wheezes or rales. Normal effort    GI: BS +, non-tender, non-distended  Musc: No edema or tenderness in extremities. Neurological: Alert. Reasonable insight and awareness.  Speech clear.  Motor: RUE: Grossly 5/5 throughout, unchanged Skin: Skin iswarm. Noerythema.  Psychiatric: flat.  Assessment/Plan: 1. 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  Physiatrist is providing close team supervision and 24 hour management of active medical problems listed below.  Physiatrist and rehab team continue to assess barriers to discharge/monitor patient progress toward functional and medical goals  Care Tool:  Bathing    Body parts bathed by patient: Right arm, Left arm, Chest, Abdomen,  Front perineal area, Buttocks, Right upper leg, Right lower leg, Left lower leg, Face, Left upper leg   Body parts bathed by helper: Left lower leg, Right lower leg     Bathing assist Assist Level: Supervision/Verbal cueing Assistive Device Comment: LH sponge   Upper Body Dressing/Undressing Upper body dressing   What is the patient wearing?: Pull over shirt    Upper body assist Assist Level: Set up assist    Lower Body Dressing/Undressing Lower body dressing      What is the patient wearing?: Pants, Incontinence brief     Lower body assist Assist for lower body dressing: Supervision/Verbal cueing Assistive Device Comment: reacher   Toileting Toileting    Toileting assist Assist for toileting: Supervision/Verbal cueing     Transfers Chair/bed transfer  Transfers assist     Chair/bed transfer assist level: Supervision/Verbal cueing     Locomotion Ambulation   Ambulation assist      Assist level: Supervision/Verbal cueing Assistive device: Walker-rolling Max distance: 150 ft   Walk 10 feet activity   Assist     Assist level: Supervision/Verbal cueing Assistive device: Walker-rolling   Walk 50 feet activity   Assist    Assist level: Supervision/Verbal cueing Assistive device: Walker-rolling    Walk 150 feet activity   Assist Walk 150 feet activity did not occur: Safety/medical concerns  Assist level: Supervision/Verbal cueing Assistive device: Walker-rolling    Walk 10 feet on uneven surface  activity   Assist Walk 10 feet on uneven surfaces activity  did not occur: Safety/medical concerns   Assist level: Supervision/Verbal cueing Assistive device: Aeronautical engineer Will patient use wheelchair at discharge?: No   Wheelchair activity did not occur: N/A         Wheelchair 50 feet with 2 turns activity    Assist    Wheelchair 50 feet with 2 turns activity did not occur: N/A       Wheelchair  150 feet activity     Assist Wheelchair 150 feet activity did not occur: N/A          Medical Problem List and Plan: 1.Functional and mobility deficitssecondary to left acute on chronic fronto parietal SDH  Dc home today  Will see patient for transitional care management in 1-2 weeks post-discharge 2. Antithrombotics: -DVT/anticoagulation:Mechanical:Sequential compression devices, below kneeBilateral lower extremities  Dopplers negative for DVT -antiplatelet therapy: Off ASA 3. Pain Management:Hydrocodone prn. 4. Mood:LCSW to follow for evaluation and support. -antipsychotic agents: N/A 5. Neuropsych: This patientiscapable of making decisions onhisown behalf. 6. Skin/Wound Care:Monitor incision for healing. 7. Fluids/Electrolytes/Nutrition:Monitor I/Os 8. New onset seizures/Status epilepticus?: On Keppra 1000 mg bid and Dilantin 100 mg tid.   Last phenytoin level low on 6/1 9. OSA: Encourage compliance with CPAP 10. BPH: Resumed Flomax --was on 0.8 mg/HS.  PVRs unremarkable 11. ?CKD: Improved with IVF for hydration. Has history of peripheral edema--d/c IVF to avoid overload.   -cr 0.89 today, all labs reviewed 13. HTN: Monitor BP tid--continue to hold Norvasc, Lisinopril and propranolol (question for tremors).  Metoprolol increased to 25 twice daily on 6/4, increased to 37.5 on 6/5  fairly controlled on 6/10  Further adjustments as outpt 14. Hypokalemia:  Supplement added on 6/5  Potassium 3.9 today  Cont to monitor 15. New onset A fib: Monitor HR bid.   Metoprolol increased to 25 mg bid on 6/4, increased again on 6/5    control at present 16. Hypoxia: Encourage IS.   Weaned supplemental oxygen  CXR reviewed, unremarkable for infection 17. Hypoalbuminemia  Supplement initiated on 6/2 18. Fever: Resolved  Started on IV Vanc, DC'd  Blood cultures no growth  UA likely neg, Ucx less than 40,000  CXR unremarkable for  infection 19. ABLA  Hb 10.9 on 6/8  Cont to monitor 20.  Prediabetes  Hemoglobin A1c 5.7 on 6/5  Labile on 6/5 21.  Hypomagnesemia  Magnesium 1.7 today  Daily supplementation started on 6/5---continue at home     LOS: 9 days A FACE TO FACE EVALUATION WAS PERFORMED  Meredith Staggers 03/11/2019, 8:23 AM

## 2019-03-11 NOTE — Discharge Instructions (Signed)
Inpatient Rehab Discharge Instructions  Preston Hill Discharge date and time: 03/10/09   Activities/Precautions/ Functional Status: Activity: no lifting, driving, or strenuous exercise for till cleared by MD Diet: cardiac diet--Monitor/limit carb intake.  Wound Care: keep wound clean and dry    Functional status:  ___ No restrictions     ___ Walk up steps independently _X__ 24/7 supervision/assistance   ___ Walk up steps with assistance ___ Intermittent supervision/assistance  ___ Bathe/dress independently ___ Walk with walker     ___ Bathe/dress with assistance ___ Walk Independently    ___ Shower independently ___ Walk with assistance    _X__ Shower with assistance _X__ No alcohol     ___ Return to work/school ________  Special Instructions: 1. Family to assist patient with medication management.  2. NO aspirin, aspirin containing products, NSAIDS (ibuprofen, aleve, motrin) or alcohol.    Per [facility] statutes, patients with seizures are not allowed to drive until  they have been seizure-free for six months. Use caution when using heavy equipment or power tools. Avoid working on ladders or at heights. Take showers instead of baths. No hot tubs. Ensure the water temperature is not too high on the home water heater. Do not go swimming alone. Avoid high altitudes. When caring for infants or small children, sit down when holding, feeding, or changing them to minimize risk of injury to the child in the event you have a seizure. Also, Maintain good sleep hygiene. Avoid alcohol.   COMMUNITY REFERRALS UPON DISCHARGE:    Home Health:   PT     OT     ST                      Agency:  [facility] Phone: [phone]   Medical Equipment/Items Ordered:  Commode and rolling walker                                                      Agency/Supplier:  [facility] @ [phone]    My questions have been answered and I understand these instructions. I will adhere to these  goals and the provided educational materials after my discharge from the hospital.  Patient/Caregiver Signature _______________________________ Date __________  Clinician Signature _______________________________________ Date __________  Please bring this form and your medication list with you to all your follow-up doctor's appointments.

## 2019-03-11 NOTE — Progress Notes (Signed)
Pt. Got d/c instructions,pt. Is ready to go home with his family.

## 2019-03-11 NOTE — Progress Notes (Signed)
Occupational Therapy Session Note  Patient Details  Name: Preston Hill MRN: [id number] Date of Birth: [birth year]  Today's Date: 03/11/2019 OT Individual Time: 1000-1043 OT Individual Time Calculation (min): 43 min    Short Term Goals: Week 1:  OT Short Term Goal 1 (Week 1): STG=LTG 2/2 ELOS  Skilled Therapeutic Interventions/Progress Updates:    1:1. Pt received in w/c with wife and son present for family education. Reviewed supervision of transfers, key points of control, body mechanics, locking w/c brakes, safety awareness, RW management, energy conservation (handout provided), allowing pt to complete ADLs with supervision only, and bathroom transfers. Wife able to demonstrate cuing/supervision for toileting/transfer. OT reviews side stepping over edge of tub using grab bars and wife able to return demo cuing for sequencing of transfer. Exited session with pt seated in w/c, call light in reach and all needs met  Therapy Documentation Precautions:  Precautions Precautions: Fall Precaution Comments: watch HR, 02 Restrictions Weight Bearing Restrictions: No General:   Vital Signs: Therapy Vitals Pulse Rate: 60 BP: (!) 142/80 Patient Position (if appropriate): Sitting Pain: Pain Assessment Pain Scale: 0-10 Pain Score: 0-No pain ADL: ADL Eating: Independent Grooming: Independent Upper Body Bathing: Supervision/safety Lower Body Bathing: Supervision/safety Upper Body Dressing: Setup Lower Body Dressing: Supervision/safety Toileting: Supervision/safety Toilet Transfer: Distant supervision Toilet Transfer Method: Ambulating Tub/Shower Transfer: Close supervison Tub/Shower Transfer Method: Ambulating Vision   Perception    Praxis   Exercises:   Other Treatments:     Therapy/Group: Individual Therapy  Tonny Branch 03/11/2019, 10:45 AM

## 2019-03-11 NOTE — Progress Notes (Signed)
Social Work  Discharge Note  The overall goal for the admission was met for:   Discharge location: Yes - home with wife who can provide 24/7 assistance.  Son also supportive.  Length of Stay: Yes  Discharge activity level: Yes - supervision overall  Home/community participation: Yes  Services provided included: MD, RD, PT, OT, SLP, RN, TR, Pharmacy and [place]: [employer]  Follow-up services arranged: Home Health: PT, OT, ST via [facility], DME: 3n1 commode via [place] and Patient/Family has no preference for HH/DME agencies  Comments (or additional information):     Contact info:  Pt/ wife at [facility]) [phone]          Wife, Wilburn Cornelia, @ (C) [phone]  Patient/Family verbalized understanding of follow-up arrangements: Yes  Individual responsible for coordination of the follow-up plan: pt/ spouse  Confirmed correct DME delivered: Lennart Pall 03/11/2019    Jacksen Isip, Lorre Nick

## 2019-03-13 ENCOUNTER — Telehealth: Payer: Self-pay | Admitting: *Deleted

## 2019-03-13 NOTE — Telephone Encounter (Signed)
Transitional Care call- I spoke with his wife Preston Hill, Preston Hill was napping    1. Are you/is patient experiencing any problems since coming home? Are there any questions regarding any aspect of care? NO  2. Are there any questions regarding medications administration/dosing? Are meds being taken as prescribed? Patient should review meds with caller to confirm Yes they have his medications 3. Have there been any falls? NO 4. Has Home Health been to the house and/or have they contacted you? If not, have you tried to contact them? Can we help you contact them? YES PT/OT has been out. Expecting ST today or tomorrow 5. Are bowels and bladder emptying properly? Are there any unexpected incontinence issues? If applicable, is patient following bowel/bladder programs? Same issues with bladder he was having in hospital. No new problems. 6. Any fevers, problems with breathing, unexpected pain? NO, he is having a cough-I advised her that ST will evaluate him for this as related to his swallow function. 7. Are there any skin problems or new areas of breakdown? NO 8. Has the patient/family member arranged specialty MD follow up (ie cardiology/neurology/renal/surgical/etc)?  Can we help arrange? I have given appt to see Dr Posey Pronto on same day he sees cardiologist office upstairs 9. Does the patient need any other services or support that we can help arrange? NO 10. Are caregivers following through as expected in assisting the patient? YES 11. Has the patient quit smoking, drinking alcohol, or using drugs as recommended? N/A  Appointment Wednesday 03/25/19 @10 :00, arrive by 9:30 (he has appt upstairs @heartcare  @8 :20) Alerted to watch mail for packet from our office and address reviewed. [place]

## 2019-03-25 ENCOUNTER — Other Ambulatory Visit: Payer: Self-pay

## 2019-03-25 ENCOUNTER — Encounter: Payer: Self-pay | Admitting: Physical Medicine & Rehabilitation

## 2019-03-25 ENCOUNTER — Ambulatory Visit: Payer: Medicare HMO | Admitting: Nurse Practitioner

## 2019-03-25 ENCOUNTER — Encounter: Payer: Medicare HMO | Attending: Physical Medicine & Rehabilitation | Admitting: Physical Medicine & Rehabilitation

## 2019-03-25 VITALS — BP 196/115 | HR 77 | Temp 97.7°F | Ht 66.0 in | Wt 178.6 lb

## 2019-03-25 DIAGNOSIS — R569 Unspecified convulsions: Secondary | ICD-10-CM

## 2019-03-25 DIAGNOSIS — R338 Other retention of urine: Secondary | ICD-10-CM | POA: Insufficient documentation

## 2019-03-25 DIAGNOSIS — E876 Hypokalemia: Secondary | ICD-10-CM | POA: Insufficient documentation

## 2019-03-25 DIAGNOSIS — R269 Unspecified abnormalities of gait and mobility: Secondary | ICD-10-CM | POA: Insufficient documentation

## 2019-03-25 DIAGNOSIS — D62 Acute posthemorrhagic anemia: Secondary | ICD-10-CM | POA: Insufficient documentation

## 2019-03-25 DIAGNOSIS — S065X9A Traumatic subdural hemorrhage with loss of consciousness of unspecified duration, initial encounter: Secondary | ICD-10-CM | POA: Diagnosis present

## 2019-03-25 DIAGNOSIS — I4891 Unspecified atrial fibrillation: Secondary | ICD-10-CM | POA: Diagnosis present

## 2019-03-25 DIAGNOSIS — I10 Essential (primary) hypertension: Secondary | ICD-10-CM | POA: Diagnosis present

## 2019-03-25 DIAGNOSIS — I160 Hypertensive urgency: Secondary | ICD-10-CM | POA: Diagnosis present

## 2019-03-25 DIAGNOSIS — S065XAA Traumatic subdural hemorrhage with loss of consciousness status unknown, initial encounter: Secondary | ICD-10-CM

## 2019-03-25 DIAGNOSIS — N401 Enlarged prostate with lower urinary tract symptoms: Secondary | ICD-10-CM

## 2019-03-25 NOTE — Progress Notes (Signed)
Subjective:    Patient ID: Preston Hill, male    DOB: [birth year], 85 y.o.   MRN: [id number]  HPI 85 year old male with history of OSA, HTN, BPH, chronic tinnitus presents for transitional care management after receiving CIR for acute on chronic left SDH.  At discharge, he was instructed to obtain lab work, but he has not done so.  He is not driving.  He has not seen PCP.  He does not have an appointment with Neurosurg or Cards. No repeat seizure episodes.  He is taking his medications.  No problems with urination. BP is uncontrolled, states he has been taking meds.  He not using supplemental oxygen.  Denies falls.  Therapies: 2/week DME: Bedside commode Mobility: Cane in community for long distances  Pain Inventory Average Pain 0 Pain Right Now 0 My pain is no pain  In the last 24 hours, has pain interfered with the following? General activity 0 Relation with others 0 Enjoyment of life 0 What TIME of day is your pain at its worst? no pain Sleep (in general) NA  Pain is worse with: no pain Pain improves with: no pain Relief from Meds: no pain  Mobility walk without assistance walk with assistance use a cane  Function retired I need assistance with the following:  meal prep, household duties and shopping  Neuro/Psych weakness tremor  Prior Studies Any changes since last visit?  no  Physicians involved in your care Primary care Delman Kitten, has not seen since discharge   Family History  Problem Relation Age of Onset  . Hypertension Mother   . Alzheimer's disease Mother    Social History   Socioeconomic History  . Marital status: Married    Spouse name: Not on file  . Number of children: Not on file  . Years of education: Not on file  . Highest education level: Not on file  Occupational History  . Not on file  Social Needs  . Financial resource strain: Not on file  . Food insecurity    Worry: Not on file    Inability: Not on file  . Transportation  needs    Medical: Not on file    Non-medical: Not on file  Tobacco Use  . Smoking status: Never Smoker  . Smokeless tobacco: Never Used  Substance and Sexual Activity  . Alcohol use: Never    Alcohol/week: 0.0 standard drinks    Frequency: Never  . Drug use: Never  . Sexual activity: Not Currently  Lifestyle  . Physical activity    Days per week: Not on file    Minutes per session: Not on file  . Stress: Not on file  Relationships  . Social Herbalist on phone: Not on file    Gets together: Not on file    Attends religious service: Not on file    Active member of club or organization: Not on file    Attends meetings of clubs or organizations: Not on file    Relationship status: Not on file  Other Topics Concern  . Not on file  Social History Narrative  . Not on file   Past Surgical History:  Procedure Laterality Date  . BURR HOLE Left 02/21/2019   Procedure: BURR HOLES, Placement of Subdural drain;  Surgeon: Earnie Larsson, MD;  Location: [facility];  Service: Neurosurgery;  Laterality: Left;  . COLONOSCOPY  3817/2006  . COLONOSCOPY WITH PROPOFOL N/A 02/01/2016   Procedure: COLONOSCOPY WITH PROPOFOL;  Surgeon:  Robert Bellow, MD;  Location: [facility];  Service: Endoscopy;  Laterality: N/A;  . EYE SURGERY Right    Cataract Extraction with IOL  . HERNIA REPAIR Left 1964   Inguinal Hernia Repair  . KNEE ARTHROSCOPY Left   . KNEE ARTHROSCOPY Right 06/26/2017   Procedure: ARTHROSCOPY KNEE WITH DEBRIDEMENT AND LOOSE BODY REMOVAL;  Surgeon: Corky Mull, MD;  Location: [facility];  Service: Orthopedics;  Laterality: Right;  . KNEE ARTHROSCOPY WITH MEDIAL MENISECTOMY Left 05/02/2017   Procedure: KNEE ARTHROSCOPY WITH MEDIAL MENISECTOMY;  Surgeon: Corky Mull, MD;  Location: [facility] OR[facility];  Service: Orthopedics;  Laterality: Left;  . KNEE ARTHROSCOPY WITH MENISCAL REPAIR  05/02/2017   Procedure: KNEE ARTHROSCOPY WITH MENISCAL REPAIR;  Surgeon: Corky Mull, MD;   Location: [facility];  Service: Orthopedics;;  . SEPTOPLASTY  2003  . uvul  2004   repair   Past Medical History:  Diagnosis Date  . Arthritis   . Chronic kidney disease   . Enlarged prostate   . Hypertension   . Occasional tremors   . Peripheral edema   . Sleep apnea    OSA---USE C-PAP  . Subdural hematoma ([facility] [place]) 02/21/2019  . Tinnitus of both ears    BP (!) 196/115 Comment: rechecked after sitting 10 minutes 201/97  Pulse 77   Temp 97.7 F (36.5 C)   Ht 5\' 6"  (1.676 m)   Wt 178 lb 9.6 oz (81 kg)   SpO2 93%   BMI 28.83 kg/m   Opioid Risk Score:   Fall Risk Score:  `1  Depression screen PHQ 2/9  Depression screen PHQ 2/9 03/25/2019  Decreased Interest 0  Down, Depressed, Hopeless 0  PHQ - 2 Score 0  Altered sleeping 0  Tired, decreased energy 2  Change in appetite 0  Feeling bad or failure about yourself  0  Trouble concentrating 0  Moving slowly or fidgety/restless 0  Suicidal thoughts 0  PHQ-9 Score 2  Difficult doing work/chores Not difficult at all    Review of Systems  Constitutional: Negative.   HENT: Negative.   Eyes: Negative.   Respiratory: Negative.   Cardiovascular: Negative.   Gastrointestinal: Negative.   Endocrine: Negative.   Genitourinary: Negative.   Musculoskeletal: Negative.   Skin:       Has sutures in head left side of forehead  Allergic/Immunologic: Negative.   Neurological: Positive for tremors and weakness.  Hematological: Negative.   Psychiatric/Behavioral: Negative.   All other systems reviewed and are negative.      Objective:   Physical Exam Constitutional: No distress . Vital signs reviewed. HENT: Normocephalic.  Atraumatic. Eyes: EOMI. No discharge. Cardiovascular: No JVD. Respiratory: Normal effort. GI: Non-distended. Musc: No edema or tenderness in extremities. Neurological: Alert. Reasonable insight and awareness.  Speech clear.  Motor: RUE: Grossly 4+-5/5 throughout Skin: Skin is warm. No erythema.   Psychiatric: flat.      Assessment & Plan:  85 year old male with history of OSA, HTN, BPH, chronic tinnitus presents for transitional care management after receiving CIR for acute on chronic left SDH.  1. Functional and mobility deficits secondary to left acute on chronic fronto parietal SDH  Cont therapies  Needs follow up with Neurosurg  No driving  2. New onset seizures/Status epilepticus?:   No repeat occurences  Cont meds  Needs Neurology appointment  3. BPH:   Stable  4. HTN:   Hypertensive urgency today  Cont meds  Needs to make PCP appointment, especially given  cranial bleed  Instructed patient present to urgent care if unable to contact physician regarding follow  Encouraged to use BP cuff at home - states he has one  5. Hypokalemia:   On supplement, did not obtain repeat labs  Repeat labs ordered  6. New onset A fib: Monitor HR bid.   Cont meds  Needs Cards appointment  7.  Hypomagnesemia  Cont supplement  No repeat labs performed, will order  8. ABLA  Trending down slightly in hospital  Repeat labs ordered  9. Gait abnormality  Cont therapies  Cont cane for safety  Meds reviewed Referrals reviewed - Needs Cards, PCP, Neurology, Neurosurgery appointment

## 2019-03-27 LAB — CBC WITH DIFFERENTIAL
Basophils Absolute: 0.1 10*3/uL (ref 0.0–0.2)
Basos: 1 %
EOS (ABSOLUTE): 0.1 10*3/uL (ref 0.0–0.4)
Eos: 1 %
Hematocrit: 37 % — ABNORMAL LOW (ref 37.5–51.0)
Hemoglobin: 12.5 g/dL — ABNORMAL LOW (ref 13.0–17.7)
Immature Grans (Abs): 0 10*3/uL (ref 0.0–0.1)
Immature Granulocytes: 0 %
Lymphocytes Absolute: 1.5 10*3/uL (ref 0.7–3.1)
Lymphs: 24 %
MCH: 29.2 pg (ref 26.6–33.0)
MCHC: 33.8 g/dL (ref 31.5–35.7)
MCV: 86 fL (ref 79–97)
Monocytes Absolute: 0.6 10*3/uL (ref 0.1–0.9)
Monocytes: 9 %
Neutrophils Absolute: 4 10*3/uL (ref 1.4–7.0)
Neutrophils: 65 %
RBC: 4.28 x10E6/uL (ref 4.14–5.80)
RDW: 13.1 % (ref 11.6–15.4)
WBC: 6.3 10*3/uL (ref 3.4–10.8)

## 2019-03-27 LAB — COMPREHENSIVE METABOLIC PANEL
ALT: 10 IU/L (ref 0–44)
AST: 12 IU/L (ref 0–40)
Albumin/Globulin Ratio: 1.6 (ref 1.2–2.2)
Albumin: 4 g/dL (ref 3.6–4.6)
Alkaline Phosphatase: 51 IU/L (ref 39–117)
BUN/Creatinine Ratio: 13 (ref 10–24)
BUN: 16 mg/dL (ref 8–27)
Bilirubin Total: 0.4 mg/dL (ref 0.0–1.2)
CO2: 24 mmol/L (ref 20–29)
Calcium: 9.6 mg/dL (ref 8.6–10.2)
Chloride: 100 mmol/L (ref 96–106)
Creatinine, Ser: 1.24 mg/dL (ref 0.76–1.27)
GFR calc Af Amer: 62 mL/min/{1.73_m2} (ref >59)
GFR calc non Af Amer: 54 mL/min/{1.73_m2} — ABNORMAL LOW (ref >59)
Globulin, Total: 2.5 g/dL (ref 1.5–4.5)
Glucose: 107 mg/dL — ABNORMAL HIGH (ref 65–99)
Potassium: 4.6 mmol/L (ref 3.5–5.2)
Sodium: 140 mmol/L (ref 134–144)
Total Protein: 6.5 g/dL (ref 6.0–8.5)

## 2019-03-27 LAB — MAGNESIUM: Magnesium: 1.9 mg/dL (ref 1.6–2.3)

## 2019-03-30 ENCOUNTER — Other Ambulatory Visit: Payer: Self-pay | Admitting: Student

## 2019-03-30 DIAGNOSIS — Z9889 Other specified postprocedural states: Secondary | ICD-10-CM

## 2019-03-30 DIAGNOSIS — Z8679 Personal history of other diseases of the circulatory system: Secondary | ICD-10-CM

## 2019-04-13 ENCOUNTER — Ambulatory Visit
Admission: RE | Admit: 2019-04-13 | Discharge: 2019-04-13 | Disposition: A | Discharge status: Home / Self Care | Payer: Medicare HMO | Source: Ambulatory Visit | Attending: Student | Admitting: Student

## 2019-04-13 ENCOUNTER — Other Ambulatory Visit: Payer: Self-pay

## 2019-04-13 DIAGNOSIS — I6200 Nontraumatic subdural hemorrhage, unspecified: Secondary | ICD-10-CM | POA: Diagnosis not present

## 2019-04-13 DIAGNOSIS — Z8679 Personal history of other diseases of the circulatory system: Secondary | ICD-10-CM | POA: Diagnosis present

## 2019-04-13 DIAGNOSIS — Z9889 Other specified postprocedural states: Secondary | ICD-10-CM | POA: Insufficient documentation

## 2019-04-27 ENCOUNTER — Ambulatory Visit: Payer: Medicare HMO | Admitting: Physical Medicine & Rehabilitation

## 2019-06-10 ENCOUNTER — Other Ambulatory Visit: Payer: Self-pay | Admitting: Neurology

## 2019-06-10 DIAGNOSIS — R569 Unspecified convulsions: Secondary | ICD-10-CM

## 2019-06-21 ENCOUNTER — Ambulatory Visit
Admission: RE | Admit: 2019-06-21 | Discharge: 2019-06-21 | Disposition: A | Discharge status: Home / Self Care | Payer: Medicare HMO | Source: Ambulatory Visit | Attending: Neurology | Admitting: Neurology

## 2019-06-21 ENCOUNTER — Other Ambulatory Visit: Payer: Self-pay

## 2019-06-21 DIAGNOSIS — R569 Unspecified convulsions: Secondary | ICD-10-CM | POA: Diagnosis present

## 2019-06-25 ENCOUNTER — Ambulatory Visit: Payer: Self-pay | Admitting: Urology

## 2019-07-01 ENCOUNTER — Ambulatory Visit (INDEPENDENT_AMBULATORY_CARE_PROVIDER_SITE_OTHER): Payer: Medicare HMO | Admitting: Urology

## 2019-07-01 ENCOUNTER — Other Ambulatory Visit: Payer: Self-pay

## 2019-07-01 ENCOUNTER — Encounter: Payer: Self-pay | Admitting: Urology

## 2019-07-01 VITALS — BP 118/73 | HR 66 | Ht 66.0 in | Wt 185.5 lb

## 2019-07-01 DIAGNOSIS — N401 Enlarged prostate with lower urinary tract symptoms: Secondary | ICD-10-CM | POA: Diagnosis not present

## 2019-07-01 DIAGNOSIS — R351 Nocturia: Secondary | ICD-10-CM | POA: Diagnosis not present

## 2019-07-01 LAB — URINALYSIS, COMPLETE
Bilirubin, UA: NEGATIVE
Glucose, UA: NEGATIVE
Ketones, UA: NEGATIVE
Leukocytes,UA: NEGATIVE
Nitrite, UA: NEGATIVE
Protein,UA: NEGATIVE
Specific Gravity, UA: 1.025 (ref 1.005–1.030)
Urobilinogen, Ur: 0.2 mg/dL (ref 0.2–1.0)
pH, UA: 5.5 (ref 5.0–7.5)

## 2019-07-01 LAB — MICROSCOPIC EXAMINATION
Bacteria, UA: NONE SEEN
Epithelial Cells (non renal): NONE SEEN /hpf (ref 0–10)

## 2019-07-01 LAB — BLADDER SCAN AMB NON-IMAGING

## 2019-07-01 MED ORDER — TROSPIUM CHLORIDE 20 MG PO TABS: 20.0000 mg | ORAL_TABLET | Freq: Every day | ORAL | 0 refills | Status: DC

## 2019-07-01 NOTE — Progress Notes (Signed)
07/01/2019 10:54 AM   Preston Hill [birth year] [id number]  Referring provider: Albina Billet, MD [address]   [place],  [place] [address]  Chief Complaint  Patient presents with  . Nocturia  . Benign Prostatic Hypertrophy    HPI: Preston Hill is a 85 y.o. male seen at the request of Dr. Caryl Comes for evaluation of nocturia.  He has a long history of BPH and has been on tamsulosin for several years.  He is currently taking 0.8 mg daily.  His most bothersome urinary symptom is nocturia x3.  He has mild daytime frequency and urgency.  He denies urinary incontinence.  IPSS completed today was 12/35 with a quality of life rated 5/6.  He denies dysuria or gross hematuria.  Denies flank, abdominal or pelvic pain.  No prior history of urologic evaluation.  He does have sleep apnea but uses a CPAP.   PMH: Past Medical History:  Diagnosis Date  . Arthritis   . Chronic kidney disease   . Enlarged prostate   . Hypertension   . Occasional tremors   . Peripheral edema   . Sleep apnea    OSA---USE C-PAP  . Subdural hematoma ([facility]) 02/21/2019  . Tinnitus of both ears     Surgical History: Past Surgical History:  Procedure Laterality Date  . BURR HOLE Left 02/21/2019   Procedure: BURR HOLES, Placement of Subdural drain;  Surgeon: Earnie Larsson, MD;  Location: [facility];  Service: Neurosurgery;  Laterality: Left;  . COLONOSCOPY  3817/2006  . COLONOSCOPY WITH PROPOFOL N/A 02/01/2016   Procedure: COLONOSCOPY WITH PROPOFOL;  Surgeon: Robert Bellow, MD;  [facility] ENDOSCOPY;  Service: Endoscopy;  Laterality: N/A;  . EYE SURGERY Right    Cataract Extraction with IOL  . HERNIA REPAIR Left 1964   Inguinal Hernia Repair  . KNEE ARTHROSCOPY Left   . KNEE ARTHROSCOPY Right 06/26/2017   Procedure: ARTHROSCOPY KNEE WITH DEBRIDEMENT AND LOOSE BODY REMOVAL;  Surgeon: Corky Mull, MD;  Location: [facility];  Service: Orthopedics;  Laterality: Right;  . KNEE ARTHROSCOPY WITH MEDIAL  MENISECTOMY Left 05/02/2017   Procedure: KNEE ARTHROSCOPY WITH MEDIAL MENISECTOMY;  Surgeon: Corky Mull, MD;  Location: [facility];  Service: Orthopedics;  Laterality: Left;  . KNEE ARTHROSCOPY WITH MENISCAL REPAIR  05/02/2017   Procedure: KNEE ARTHROSCOPY WITH MENISCAL REPAIR;  Surgeon: Corky Mull, MD;  Location: [facility];  Service: Orthopedics;;  . SEPTOPLASTY  2003  . uvul  2004   repair    Home Medications:  Allergies as of 07/01/2019      Reactions   Codeine Anxiety   Sick      Medication List       Accurate as of July 01, 2019 10:54 AM. If you have any questions, ask your nurse or doctor.        STOP taking these medications   Magnesium Oxide 400 MG Caps Stopped by: Abbie Sons, MD     TAKE these medications   acetaminophen 325 MG tablet Commonly known as: TYLENOL Take 1-2 tablets (325-650 mg total) by mouth every 4 (four) hours as needed for mild pain.   amLODipine 10 MG tablet Commonly known as: NORVASC   levETIRAcetam 500 MG tablet Commonly known as: KEPPRA Take 500 mg by mouth daily.   lisinopril 20 MG tablet Commonly known as: ZESTRIL Take by mouth.   magnesium oxide 400 (241.3 Mg) MG tablet Commonly known as: MAG-OX Take 1 tablet (400  mg total) by mouth daily.   Metoprolol Tartrate 37.5 MG Tabs Take 37.5 mg by mouth 2 (two) times daily.   pantoprazole 40 MG tablet Commonly known as: PROTONIX Take 1 tablet (40 mg total) by mouth daily.   potassium chloride SA 20 MEQ tablet Commonly known as: KLOR-CON Take 1 tablet (20 mEq total) by mouth daily.   tamsulosin 0.4 MG Caps capsule Commonly known as: FLOMAX Take 0.8 mg by mouth at bedtime.       Allergies:  Allergies  Allergen Reactions  . Codeine Anxiety    Sick     Family History: Family History  Problem Relation Age of Onset  . Hypertension Mother   . Alzheimer's disease Mother     Social History:  reports that he has never smoked. He has never used smokeless tobacco.  He reports that he does not drink alcohol or use drugs.  ROS: UROLOGY Frequent Urination?: Yes Hard to postpone urination?: No Burning/pain with urination?: No Get up at night to urinate?: Yes Leakage of urine?: No Urine stream starts and stops?: No Trouble starting stream?: No Do you have to strain to urinate?: No Blood in urine?: No Urinary tract infection?: No Sexually transmitted disease?: No Injury to kidneys or bladder?: No Painful intercourse?: No Weak stream?: No Erection problems?: No Penile pain?: No  Gastrointestinal Nausea?: No Vomiting?: No Indigestion/heartburn?: No Diarrhea?: No Constipation?: No  Constitutional Fever: No Night sweats?: No Weight loss?: No Fatigue?: No  Skin Skin rash/lesions?: No Itching?: No  Eyes Blurred vision?: No Double vision?: No  Ears/Nose/Throat Sore throat?: No Sinus problems?: No  Hematologic/Lymphatic Swollen glands?: No Easy bruising?: No  Cardiovascular Leg swelling?: No Chest pain?: No  Respiratory Cough?: No Shortness of breath?: No  Endocrine Excessive thirst?: No  Musculoskeletal Back pain?: No Joint pain?: No  Neurological Headaches?: No Dizziness?: No  Psychologic Depression?: No Anxiety?: No  Physical Exam: BP 118/73 (BP Location: Left Arm, Patient Position: Sitting, Cuff Size: Normal)   Pulse 66   Ht 5\' 6"  (1.676 m)   Wt 185 lb 8 oz (84.1 kg)   BMI 29.94 kg/m   Constitutional:  Alert and oriented, No acute distress. HEENT: [place] AT, moist mucus membranes.  Trachea midline, no masses. Cardiovascular: No clubbing, cyanosis, or edema. Respiratory: Normal respiratory effort, no increased work of breathing. GI: Abdomen is soft, nontender, nondistended, no abdominal masses GU: Prostate 35 g, smooth without nodules Lymph: No cervical or inguinal lymphadenopathy. Skin: No rashes, bruises or suspicious lesions. Neurologic: Grossly intact, no focal deficits, moving all 4 extremities.  Psychiatric: Normal mood and affect.   Assessment & Plan:    - Benign prostatic hyperplasia with nocturia He has bothersome nocturia.  PVR by bladder scan was 0 mL.  We discussed multiple potential causes of nocturia including BPH, nocturnal polyuria and sleep apnea.  He does use a CPAP.  Will give an initial trial of a short acting anticholinergic 1 hour prior to bedtime.  He will call back in 1 month regarding efficacy.  Based on age and history of seizure disorder would not recommend DDAVP.  Consider trial of Myrbetriq if trospium not effective.   Abbie Sons, [place] [address] [place], [place] [address] [phone]

## 2019-07-05 ENCOUNTER — Encounter: Payer: Self-pay | Admitting: Urology

## 2019-08-06 ENCOUNTER — Telehealth: Payer: Self-pay | Admitting: Urology

## 2019-08-06 DIAGNOSIS — N401 Enlarged prostate with lower urinary tract symptoms: Secondary | ICD-10-CM

## 2019-08-06 DIAGNOSIS — R351 Nocturia: Secondary | ICD-10-CM

## 2019-08-06 MED ORDER — TROSPIUM CHLORIDE 20 MG PO TABS: 20.0000 mg | ORAL_TABLET | Freq: Every day | ORAL | 3 refills | Status: DC

## 2019-08-06 NOTE — Telephone Encounter (Signed)
Pt is having some side effects of the Rx Trospium 20mg  but would like to continue using for a little while longer, asking for refill to be sent [facility] for 90 days supply. Also wants to know his urine results from last visit, says no one called him with results. Please advise.

## 2019-08-06 NOTE — Telephone Encounter (Signed)
Called pt informed him of normal U/A results from 07/01/2019. Refill given on [facility] send to Mail order pharmacy.

## 2019-08-13 ENCOUNTER — Telehealth: Payer: Self-pay | Admitting: Urology

## 2019-08-13 NOTE — Telephone Encounter (Signed)
Pt came in to office to let us know that [facility] will charge him $155 for 90 day supply.  They suggested pt try Oxybutynin 5 mg.

## 2019-08-13 NOTE — Telephone Encounter (Signed)
Ok to switch, please advise. Thanks

## 2019-08-14 MED ORDER — OXYBUTYNIN CHLORIDE 5 MG PO TABS: ORAL_TABLET | ORAL | 3 refills | Status: DC

## 2019-08-14 NOTE — Telephone Encounter (Signed)
Okay to switch to oxybutynin 5 mg 1 hour prior to bedtime.  Rx sent

## 2019-08-14 NOTE — Telephone Encounter (Signed)
Called pt informed him that rx was changed and sent in.

## 2019-09-09 DIAGNOSIS — G4733 Obstructive sleep apnea (adult) (pediatric): Secondary | ICD-10-CM | POA: Diagnosis not present

## 2019-10-19 ENCOUNTER — Other Ambulatory Visit: Payer: Self-pay | Admitting: Neurology

## 2019-10-19 DIAGNOSIS — G939 Disorder of brain, unspecified: Secondary | ICD-10-CM | POA: Diagnosis not present

## 2019-10-19 DIAGNOSIS — I6203 Nontraumatic chronic subdural hemorrhage: Secondary | ICD-10-CM

## 2019-10-19 DIAGNOSIS — R569 Unspecified convulsions: Secondary | ICD-10-CM | POA: Diagnosis not present

## 2019-10-19 DIAGNOSIS — R519 Headache, unspecified: Secondary | ICD-10-CM | POA: Diagnosis not present

## 2019-10-27 ENCOUNTER — Ambulatory Visit
Admission: RE | Admit: 2019-10-27 | Discharge: 2019-10-27 | Disposition: A | Discharge status: Home / Self Care | Payer: Medicare HMO | Source: Ambulatory Visit | Attending: Neurology | Admitting: Neurology

## 2019-10-27 ENCOUNTER — Other Ambulatory Visit: Payer: Self-pay

## 2019-10-27 DIAGNOSIS — I6203 Nontraumatic chronic subdural hemorrhage: Secondary | ICD-10-CM | POA: Diagnosis not present

## 2019-10-27 DIAGNOSIS — I6200 Nontraumatic subdural hemorrhage, unspecified: Secondary | ICD-10-CM | POA: Diagnosis not present

## 2019-11-05 DIAGNOSIS — I6203 Nontraumatic chronic subdural hemorrhage: Secondary | ICD-10-CM | POA: Diagnosis not present

## 2019-11-05 DIAGNOSIS — G4733 Obstructive sleep apnea (adult) (pediatric): Secondary | ICD-10-CM | POA: Diagnosis not present

## 2019-11-05 DIAGNOSIS — I480 Paroxysmal atrial fibrillation: Secondary | ICD-10-CM | POA: Diagnosis not present

## 2019-11-05 DIAGNOSIS — E782 Mixed hyperlipidemia: Secondary | ICD-10-CM | POA: Diagnosis not present

## 2019-11-05 DIAGNOSIS — I10 Essential (primary) hypertension: Secondary | ICD-10-CM | POA: Diagnosis not present

## 2019-11-09 DIAGNOSIS — E782 Mixed hyperlipidemia: Secondary | ICD-10-CM | POA: Diagnosis not present

## 2019-11-09 DIAGNOSIS — G4733 Obstructive sleep apnea (adult) (pediatric): Secondary | ICD-10-CM | POA: Diagnosis not present

## 2019-11-09 DIAGNOSIS — I10 Essential (primary) hypertension: Secondary | ICD-10-CM | POA: Diagnosis not present

## 2019-11-09 DIAGNOSIS — R600 Localized edema: Secondary | ICD-10-CM | POA: Diagnosis not present

## 2019-11-09 DIAGNOSIS — I480 Paroxysmal atrial fibrillation: Secondary | ICD-10-CM | POA: Diagnosis not present

## 2019-11-12 DIAGNOSIS — R251 Tremor, unspecified: Secondary | ICD-10-CM | POA: Diagnosis not present

## 2019-11-12 DIAGNOSIS — Z8679 Personal history of other diseases of the circulatory system: Secondary | ICD-10-CM | POA: Diagnosis not present

## 2019-11-12 DIAGNOSIS — I480 Paroxysmal atrial fibrillation: Secondary | ICD-10-CM | POA: Diagnosis not present

## 2019-11-12 DIAGNOSIS — G40909 Epilepsy, unspecified, not intractable, without status epilepticus: Secondary | ICD-10-CM | POA: Diagnosis not present

## 2019-11-12 DIAGNOSIS — Z9989 Dependence on other enabling machines and devices: Secondary | ICD-10-CM | POA: Diagnosis not present

## 2019-11-12 DIAGNOSIS — G4733 Obstructive sleep apnea (adult) (pediatric): Secondary | ICD-10-CM | POA: Diagnosis not present

## 2019-11-12 DIAGNOSIS — I10 Essential (primary) hypertension: Secondary | ICD-10-CM | POA: Diagnosis not present

## 2019-11-12 DIAGNOSIS — E782 Mixed hyperlipidemia: Secondary | ICD-10-CM | POA: Diagnosis not present

## 2020-01-30 IMAGING — CT CT HEAD WITHOUT CONTRAST
3 series · 14 of 47 positions shown, 16 images · non-contrast
Comparison: 02/22/2019

CLINICAL DATA: Follow-up subdural hematoma, drain placement [DATE]

EXAM:
CT HEAD WITHOUT CONTRAST
TECHNIQUE: Contiguous axial images were obtained from the base of the skull
through the vertex without intravenous contrast.

[Series 2: head wo · axial · 0.47mm/px · z∈[+378,+503]mm · 8 of 30 slices shown, 10 images]
[im 3/30  brain]
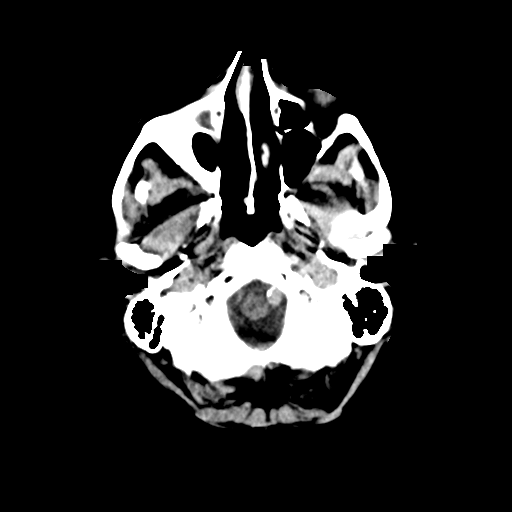
[im 3/30  bone]
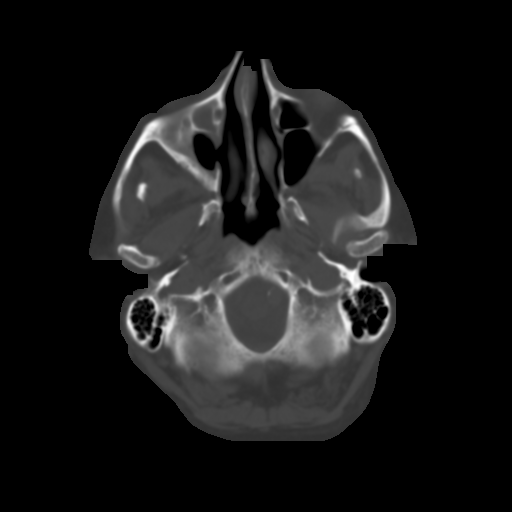
[im 7/30  brain]
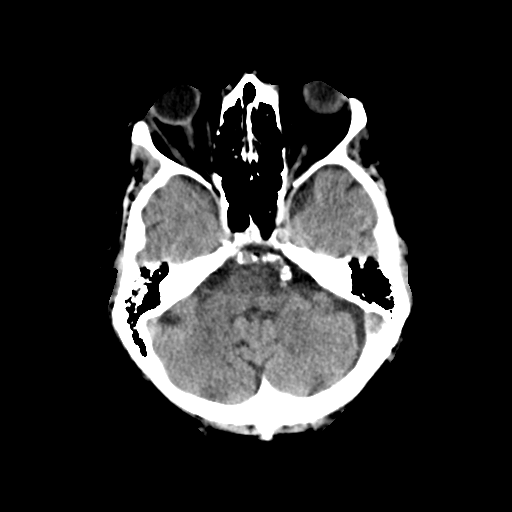
[im 10/30  brain]
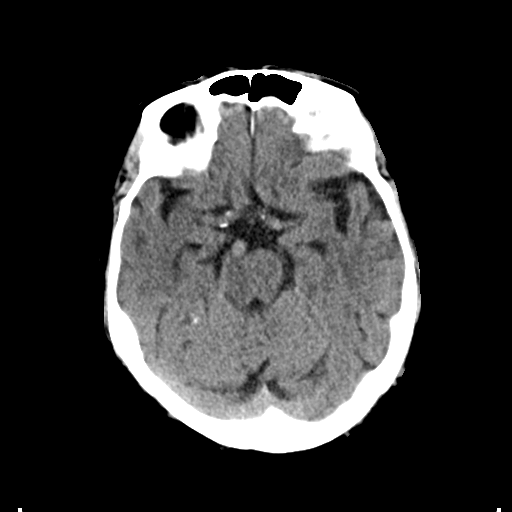
[im 14/30  brain]
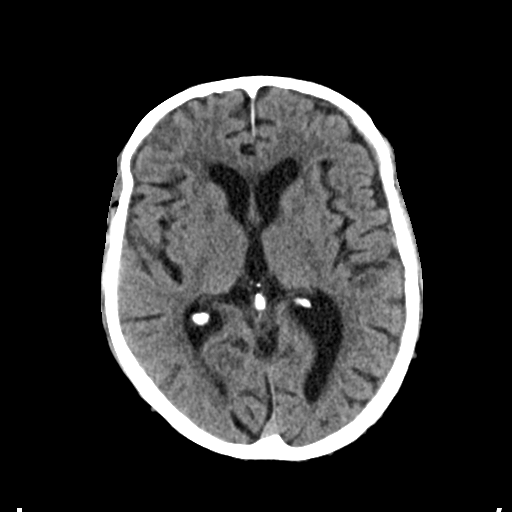
[im 17/30  brain]
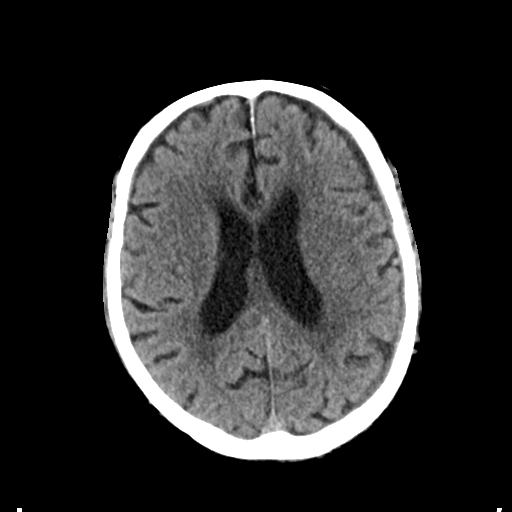
[im 17/30  bone]
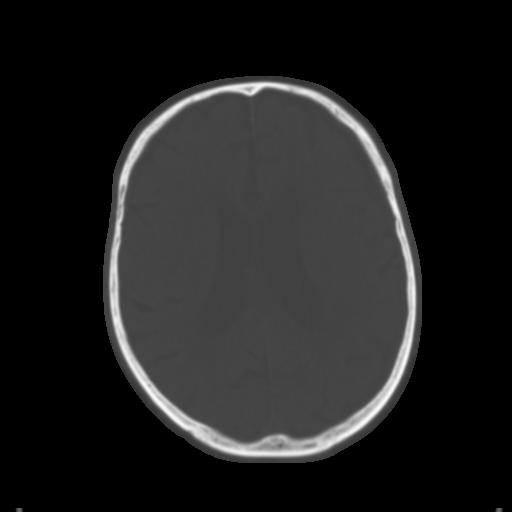
[im 21/30  brain]
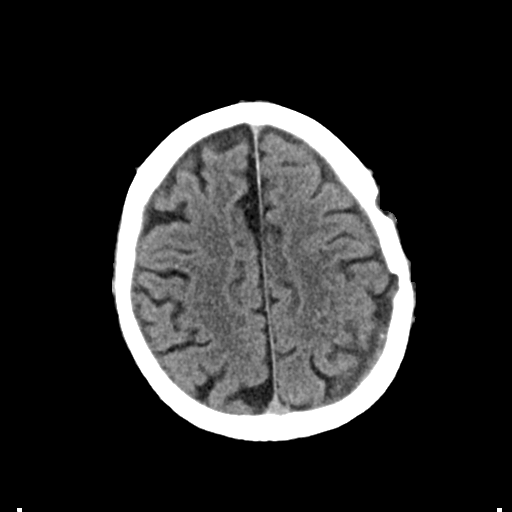
[im 24/30  brain]
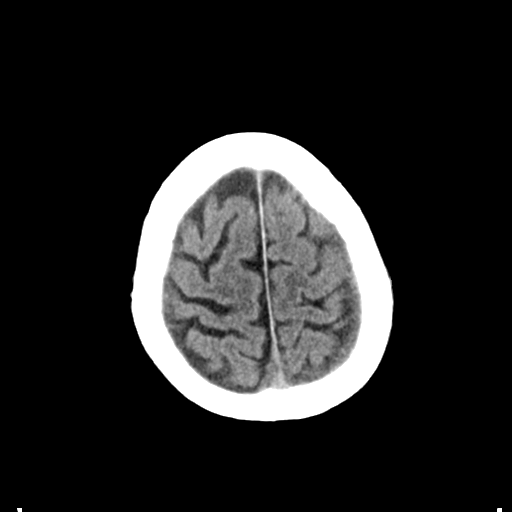
[im 28/30  brain]
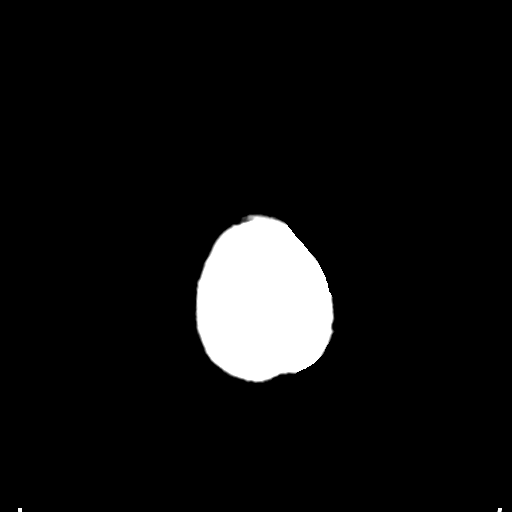

[Series 4: coronal soft tissue · coronal · 0.28mm/px · 3 of 70 slices shown]
[im 24/70  brain]
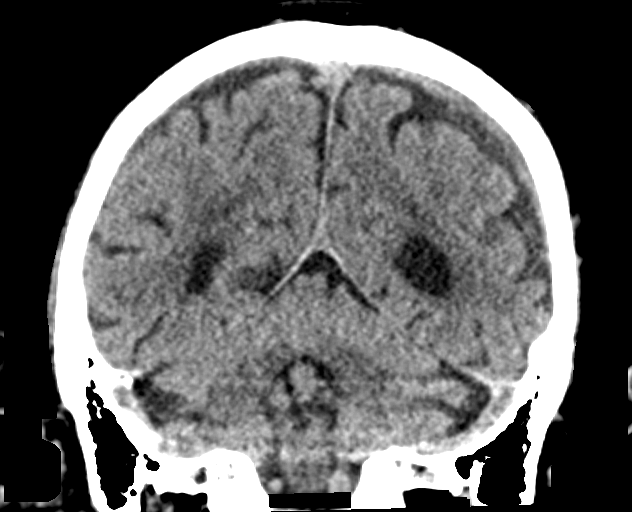
[im 31/70  brain]
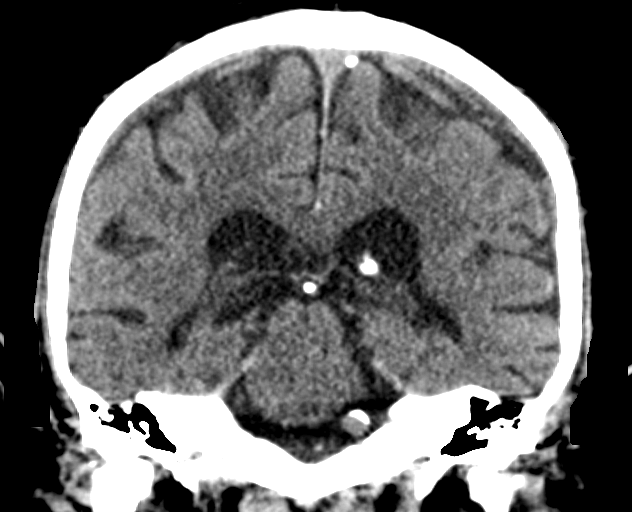
[im 39/70  brain]
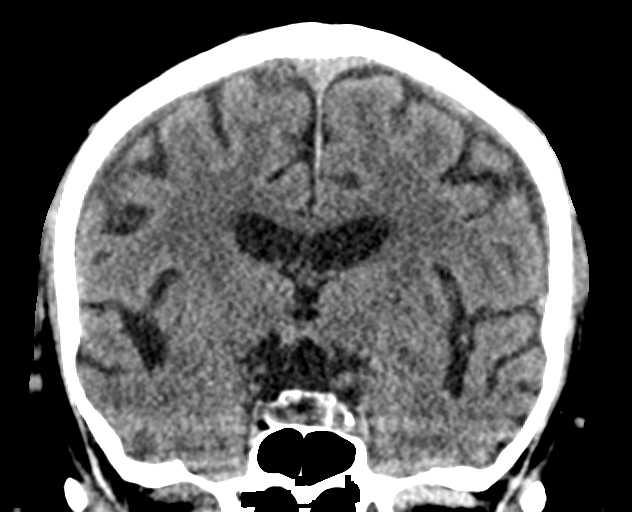

[Series 5: sagittal soft tissue · sagittal · 0.28mm/px · 3 of 58 slices shown]
[im 20/58  brain]
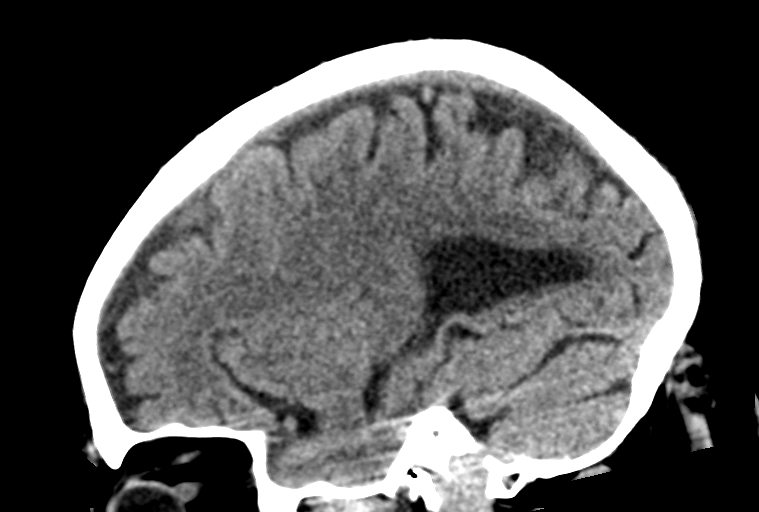
[im 29/58  brain]
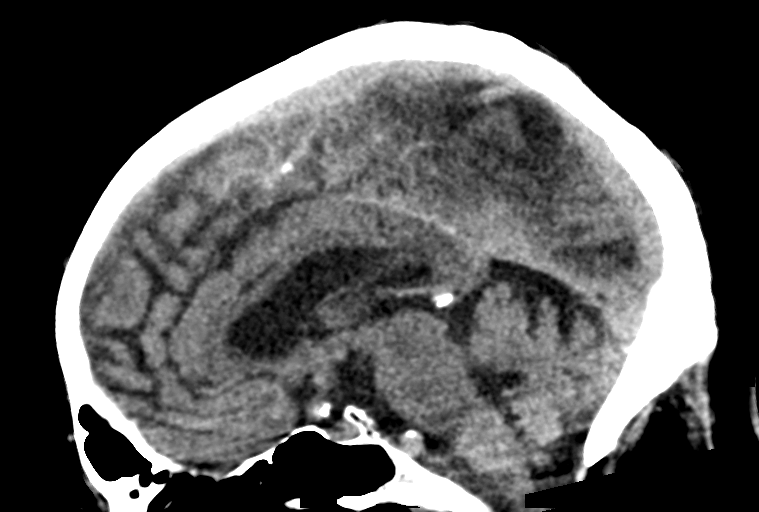
[im 39/58  brain]
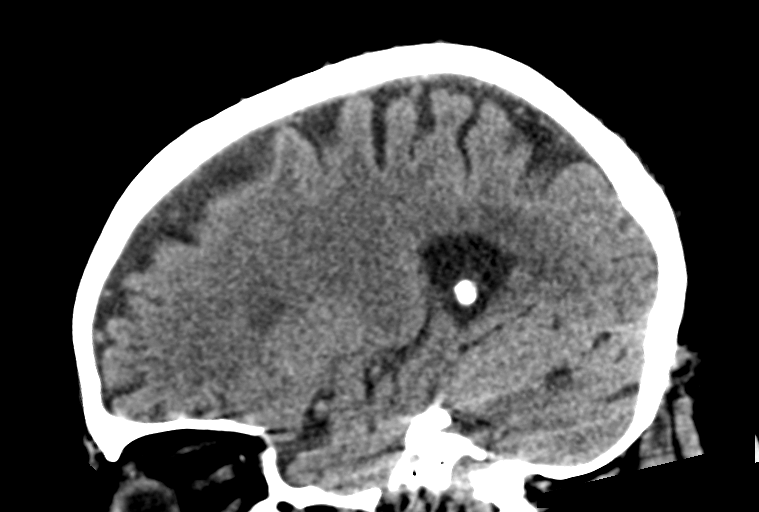

[14 of 47 positions shown; findings below may reference images not displayed]

FINDINGS: Brain: There has been near complete interval resolution of a
previously seen high density left subdural hematoma with internal
air loculations and a subdural drain. There is a small,
approximately 3 mm thick intermediate attenuation collection about
the left hemisphere with resolution of pneumocephalus. Interval
removal of previously seen drain catheter. Mild, persistent
approximately 3 mm left-to-right midline shift, decreased compared
to prior examination. Underlying small-vessel white matter disease.

Vascular: No hyperdense vessel or unexpected calcification.

Skull: Left parietal burr hole craniotomies. Negative for fracture
or focal lesion.

Sinuses/Orbits: Chronic mucosal thickening of the right maxillary
sinus.

Other: None.
IMPRESSION: 1. There has been near complete interval resolution of a previously
seen high density left subdural hematoma with internal air
loculations and a subdural drain. There is a small, approximately 3
mm thick intermediate attenuation collection about the left
hemisphere with resolution of pneumocephalus. Interval removal of
previously seen drain catheter. Mild, persistent approximately 3 mm
left-to-right midline shift, decreased compared to prior
examination.

2.  Left parietal burr hole craniotomies.

3.  Underlying small-vessel white matter disease.

## 2020-02-12 DIAGNOSIS — M170 Bilateral primary osteoarthritis of knee: Secondary | ICD-10-CM | POA: Diagnosis not present

## 2020-02-23 DIAGNOSIS — I6203 Nontraumatic chronic subdural hemorrhage: Secondary | ICD-10-CM | POA: Diagnosis not present

## 2020-02-23 DIAGNOSIS — R251 Tremor, unspecified: Secondary | ICD-10-CM | POA: Diagnosis not present

## 2020-05-30 DIAGNOSIS — G4733 Obstructive sleep apnea (adult) (pediatric): Secondary | ICD-10-CM | POA: Diagnosis not present

## 2020-05-30 DIAGNOSIS — I480 Paroxysmal atrial fibrillation: Secondary | ICD-10-CM | POA: Diagnosis not present

## 2020-05-30 DIAGNOSIS — E782 Mixed hyperlipidemia: Secondary | ICD-10-CM | POA: Diagnosis not present

## 2020-05-30 DIAGNOSIS — I10 Essential (primary) hypertension: Secondary | ICD-10-CM | POA: Diagnosis not present

## 2020-06-09 DIAGNOSIS — I10 Essential (primary) hypertension: Secondary | ICD-10-CM | POA: Diagnosis not present

## 2020-06-09 DIAGNOSIS — G4733 Obstructive sleep apnea (adult) (pediatric): Secondary | ICD-10-CM | POA: Diagnosis not present

## 2020-06-09 DIAGNOSIS — R600 Localized edema: Secondary | ICD-10-CM | POA: Diagnosis not present

## 2020-06-09 DIAGNOSIS — R739 Hyperglycemia, unspecified: Secondary | ICD-10-CM | POA: Diagnosis not present

## 2020-06-09 DIAGNOSIS — E782 Mixed hyperlipidemia: Secondary | ICD-10-CM | POA: Diagnosis not present

## 2020-06-13 DIAGNOSIS — I6203 Nontraumatic chronic subdural hemorrhage: Secondary | ICD-10-CM | POA: Diagnosis not present

## 2020-06-13 DIAGNOSIS — R05 Cough: Secondary | ICD-10-CM | POA: Diagnosis not present

## 2020-06-13 DIAGNOSIS — J449 Chronic obstructive pulmonary disease, unspecified: Secondary | ICD-10-CM | POA: Diagnosis not present

## 2020-06-13 DIAGNOSIS — Z0000 Encounter for general adult medical examination without abnormal findings: Secondary | ICD-10-CM | POA: Diagnosis not present

## 2020-06-13 DIAGNOSIS — E782 Mixed hyperlipidemia: Secondary | ICD-10-CM | POA: Diagnosis not present

## 2020-06-13 DIAGNOSIS — Z23 Encounter for immunization: Secondary | ICD-10-CM | POA: Diagnosis not present

## 2020-06-13 DIAGNOSIS — I10 Essential (primary) hypertension: Secondary | ICD-10-CM | POA: Diagnosis not present

## 2020-06-13 DIAGNOSIS — G4733 Obstructive sleep apnea (adult) (pediatric): Secondary | ICD-10-CM | POA: Diagnosis not present

## 2020-06-13 DIAGNOSIS — I480 Paroxysmal atrial fibrillation: Secondary | ICD-10-CM | POA: Diagnosis not present

## 2020-06-27 DIAGNOSIS — G4733 Obstructive sleep apnea (adult) (pediatric): Secondary | ICD-10-CM | POA: Diagnosis not present

## 2020-06-30 ENCOUNTER — Other Ambulatory Visit: Payer: Self-pay | Admitting: Urology

## 2020-09-05 DIAGNOSIS — R251 Tremor, unspecified: Secondary | ICD-10-CM | POA: Diagnosis not present

## 2020-09-05 DIAGNOSIS — E559 Vitamin D deficiency, unspecified: Secondary | ICD-10-CM | POA: Diagnosis not present

## 2020-09-05 DIAGNOSIS — I6203 Nontraumatic chronic subdural hemorrhage: Secondary | ICD-10-CM | POA: Diagnosis not present

## 2020-09-05 DIAGNOSIS — E531 Pyridoxine deficiency: Secondary | ICD-10-CM | POA: Diagnosis not present

## 2020-09-05 DIAGNOSIS — R202 Paresthesia of skin: Secondary | ICD-10-CM | POA: Diagnosis not present

## 2020-09-05 DIAGNOSIS — R519 Headache, unspecified: Secondary | ICD-10-CM | POA: Diagnosis not present

## 2020-09-05 DIAGNOSIS — E538 Deficiency of other specified B group vitamins: Secondary | ICD-10-CM | POA: Diagnosis not present

## 2020-09-05 DIAGNOSIS — R200 Anesthesia of skin: Secondary | ICD-10-CM | POA: Diagnosis not present

## 2020-09-05 DIAGNOSIS — E519 Thiamine deficiency, unspecified: Secondary | ICD-10-CM | POA: Diagnosis not present

## 2020-09-12 ENCOUNTER — Other Ambulatory Visit: Payer: Self-pay | Admitting: Neurology

## 2020-09-12 DIAGNOSIS — I6203 Nontraumatic chronic subdural hemorrhage: Secondary | ICD-10-CM

## 2020-09-14 ENCOUNTER — Ambulatory Visit
Admission: RE | Admit: 2020-09-14 | Discharge: 2020-09-14 | Disposition: A | Discharge status: Home / Self Care | Payer: Medicare HMO | Source: Ambulatory Visit | Attending: Neurology | Admitting: Neurology

## 2020-09-14 ENCOUNTER — Other Ambulatory Visit: Payer: Self-pay

## 2020-09-14 DIAGNOSIS — I6203 Nontraumatic chronic subdural hemorrhage: Secondary | ICD-10-CM | POA: Insufficient documentation

## 2020-09-14 DIAGNOSIS — R519 Headache, unspecified: Secondary | ICD-10-CM | POA: Diagnosis not present

## 2020-09-15 DIAGNOSIS — I629 Nontraumatic intracranial hemorrhage, unspecified: Secondary | ICD-10-CM | POA: Diagnosis not present

## 2020-09-16 DIAGNOSIS — E538 Deficiency of other specified B group vitamins: Secondary | ICD-10-CM | POA: Diagnosis not present

## 2020-09-21 DIAGNOSIS — E538 Deficiency of other specified B group vitamins: Secondary | ICD-10-CM | POA: Diagnosis not present

## 2020-09-28 DIAGNOSIS — E538 Deficiency of other specified B group vitamins: Secondary | ICD-10-CM | POA: Diagnosis not present

## 2020-10-03 DIAGNOSIS — R200 Anesthesia of skin: Secondary | ICD-10-CM | POA: Diagnosis not present

## 2020-10-03 DIAGNOSIS — R202 Paresthesia of skin: Secondary | ICD-10-CM | POA: Diagnosis not present

## 2020-10-05 DIAGNOSIS — E538 Deficiency of other specified B group vitamins: Secondary | ICD-10-CM | POA: Diagnosis not present

## 2020-11-07 DIAGNOSIS — E538 Deficiency of other specified B group vitamins: Secondary | ICD-10-CM | POA: Diagnosis not present

## 2020-11-16 DIAGNOSIS — G5602 Carpal tunnel syndrome, left upper limb: Secondary | ICD-10-CM | POA: Diagnosis not present

## 2020-11-23 DIAGNOSIS — G5603 Carpal tunnel syndrome, bilateral upper limbs: Secondary | ICD-10-CM | POA: Diagnosis not present

## 2020-11-24 DIAGNOSIS — R0602 Shortness of breath: Secondary | ICD-10-CM | POA: Diagnosis not present

## 2020-11-24 DIAGNOSIS — E782 Mixed hyperlipidemia: Secondary | ICD-10-CM | POA: Diagnosis not present

## 2020-11-24 DIAGNOSIS — I10 Essential (primary) hypertension: Secondary | ICD-10-CM | POA: Diagnosis not present

## 2020-11-24 DIAGNOSIS — I480 Paroxysmal atrial fibrillation: Secondary | ICD-10-CM | POA: Diagnosis not present

## 2020-11-24 DIAGNOSIS — G4733 Obstructive sleep apnea (adult) (pediatric): Secondary | ICD-10-CM | POA: Diagnosis not present

## 2020-12-02 DIAGNOSIS — R739 Hyperglycemia, unspecified: Secondary | ICD-10-CM | POA: Diagnosis not present

## 2020-12-02 DIAGNOSIS — E782 Mixed hyperlipidemia: Secondary | ICD-10-CM | POA: Diagnosis not present

## 2020-12-02 DIAGNOSIS — I10 Essential (primary) hypertension: Secondary | ICD-10-CM | POA: Diagnosis not present

## 2020-12-02 DIAGNOSIS — I6203 Nontraumatic chronic subdural hemorrhage: Secondary | ICD-10-CM | POA: Diagnosis not present

## 2020-12-02 DIAGNOSIS — G4733 Obstructive sleep apnea (adult) (pediatric): Secondary | ICD-10-CM | POA: Diagnosis not present

## 2020-12-02 DIAGNOSIS — I480 Paroxysmal atrial fibrillation: Secondary | ICD-10-CM | POA: Diagnosis not present

## 2020-12-08 DIAGNOSIS — E538 Deficiency of other specified B group vitamins: Secondary | ICD-10-CM | POA: Diagnosis not present

## 2020-12-12 DIAGNOSIS — I6203 Nontraumatic chronic subdural hemorrhage: Secondary | ICD-10-CM | POA: Diagnosis not present

## 2020-12-12 DIAGNOSIS — I480 Paroxysmal atrial fibrillation: Secondary | ICD-10-CM | POA: Diagnosis not present

## 2020-12-12 DIAGNOSIS — I10 Essential (primary) hypertension: Secondary | ICD-10-CM | POA: Diagnosis not present

## 2020-12-12 DIAGNOSIS — E782 Mixed hyperlipidemia: Secondary | ICD-10-CM | POA: Diagnosis not present

## 2020-12-12 DIAGNOSIS — G4733 Obstructive sleep apnea (adult) (pediatric): Secondary | ICD-10-CM | POA: Diagnosis not present

## 2021-01-09 DIAGNOSIS — E538 Deficiency of other specified B group vitamins: Secondary | ICD-10-CM | POA: Diagnosis not present

## 2021-02-07 DIAGNOSIS — R251 Tremor, unspecified: Secondary | ICD-10-CM | POA: Diagnosis not present

## 2021-02-07 DIAGNOSIS — Z87898 Personal history of other specified conditions: Secondary | ICD-10-CM | POA: Diagnosis not present

## 2021-02-07 DIAGNOSIS — E538 Deficiency of other specified B group vitamins: Secondary | ICD-10-CM | POA: Diagnosis not present

## 2021-02-17 DIAGNOSIS — M170 Bilateral primary osteoarthritis of knee: Secondary | ICD-10-CM | POA: Diagnosis not present

## 2021-03-29 DIAGNOSIS — G4733 Obstructive sleep apnea (adult) (pediatric): Secondary | ICD-10-CM | POA: Diagnosis not present

## 2021-06-09 ENCOUNTER — Other Ambulatory Visit: Payer: Self-pay | Admitting: Urology

## 2021-06-12 DIAGNOSIS — E782 Mixed hyperlipidemia: Secondary | ICD-10-CM | POA: Diagnosis not present

## 2021-06-12 DIAGNOSIS — E538 Deficiency of other specified B group vitamins: Secondary | ICD-10-CM | POA: Diagnosis not present

## 2021-06-12 DIAGNOSIS — I10 Essential (primary) hypertension: Secondary | ICD-10-CM | POA: Diagnosis not present

## 2021-06-12 DIAGNOSIS — R739 Hyperglycemia, unspecified: Secondary | ICD-10-CM | POA: Diagnosis not present

## 2021-06-14 DIAGNOSIS — H5210 Myopia, unspecified eye: Secondary | ICD-10-CM | POA: Diagnosis not present

## 2021-06-14 DIAGNOSIS — I10 Essential (primary) hypertension: Secondary | ICD-10-CM | POA: Diagnosis not present

## 2021-06-19 DIAGNOSIS — Z0000 Encounter for general adult medical examination without abnormal findings: Secondary | ICD-10-CM | POA: Diagnosis not present

## 2021-06-19 DIAGNOSIS — E538 Deficiency of other specified B group vitamins: Secondary | ICD-10-CM | POA: Diagnosis not present

## 2021-06-19 DIAGNOSIS — G4733 Obstructive sleep apnea (adult) (pediatric): Secondary | ICD-10-CM | POA: Diagnosis not present

## 2021-06-19 DIAGNOSIS — I6203 Nontraumatic chronic subdural hemorrhage: Secondary | ICD-10-CM | POA: Diagnosis not present

## 2021-06-19 DIAGNOSIS — R739 Hyperglycemia, unspecified: Secondary | ICD-10-CM | POA: Diagnosis not present

## 2021-06-19 DIAGNOSIS — M47812 Spondylosis without myelopathy or radiculopathy, cervical region: Secondary | ICD-10-CM | POA: Diagnosis not present

## 2021-06-19 DIAGNOSIS — E782 Mixed hyperlipidemia: Secondary | ICD-10-CM | POA: Diagnosis not present

## 2021-06-19 DIAGNOSIS — I480 Paroxysmal atrial fibrillation: Secondary | ICD-10-CM | POA: Diagnosis not present

## 2021-06-19 DIAGNOSIS — I10 Essential (primary) hypertension: Secondary | ICD-10-CM | POA: Diagnosis not present

## 2021-07-10 DIAGNOSIS — R739 Hyperglycemia, unspecified: Secondary | ICD-10-CM | POA: Diagnosis not present

## 2021-07-10 DIAGNOSIS — E538 Deficiency of other specified B group vitamins: Secondary | ICD-10-CM | POA: Diagnosis not present

## 2021-09-12 DIAGNOSIS — M4312 Spondylolisthesis, cervical region: Secondary | ICD-10-CM | POA: Diagnosis not present

## 2021-09-12 DIAGNOSIS — R29890 Loss of height: Secondary | ICD-10-CM | POA: Diagnosis not present

## 2021-09-12 DIAGNOSIS — M542 Cervicalgia: Secondary | ICD-10-CM | POA: Diagnosis not present

## 2021-09-12 DIAGNOSIS — M5030 Other cervical disc degeneration, unspecified cervical region: Secondary | ICD-10-CM | POA: Diagnosis not present

## 2021-09-12 DIAGNOSIS — M47812 Spondylosis without myelopathy or radiculopathy, cervical region: Secondary | ICD-10-CM | POA: Diagnosis not present

## 2021-09-22 DIAGNOSIS — M542 Cervicalgia: Secondary | ICD-10-CM | POA: Diagnosis not present

## 2021-09-27 DIAGNOSIS — M1711 Unilateral primary osteoarthritis, right knee: Secondary | ICD-10-CM | POA: Diagnosis not present

## 2021-09-28 DIAGNOSIS — M542 Cervicalgia: Secondary | ICD-10-CM | POA: Diagnosis not present

## 2021-10-03 DIAGNOSIS — R319 Hematuria, unspecified: Secondary | ICD-10-CM | POA: Diagnosis not present

## 2021-10-03 DIAGNOSIS — R399 Unspecified symptoms and signs involving the genitourinary system: Secondary | ICD-10-CM | POA: Diagnosis not present

## 2021-10-04 DIAGNOSIS — M542 Cervicalgia: Secondary | ICD-10-CM | POA: Diagnosis not present

## 2021-10-06 DIAGNOSIS — M542 Cervicalgia: Secondary | ICD-10-CM | POA: Diagnosis not present

## 2021-10-09 DIAGNOSIS — M542 Cervicalgia: Secondary | ICD-10-CM | POA: Diagnosis not present

## 2021-10-12 DIAGNOSIS — M542 Cervicalgia: Secondary | ICD-10-CM | POA: Diagnosis not present

## 2021-10-16 DIAGNOSIS — M542 Cervicalgia: Secondary | ICD-10-CM | POA: Diagnosis not present

## 2021-10-19 DIAGNOSIS — M542 Cervicalgia: Secondary | ICD-10-CM | POA: Diagnosis not present

## 2021-10-24 ENCOUNTER — Other Ambulatory Visit: Payer: Self-pay | Admitting: Family Medicine

## 2021-10-24 DIAGNOSIS — M5412 Radiculopathy, cervical region: Secondary | ICD-10-CM | POA: Diagnosis not present

## 2021-10-24 DIAGNOSIS — M5030 Other cervical disc degeneration, unspecified cervical region: Secondary | ICD-10-CM | POA: Diagnosis not present

## 2021-10-24 DIAGNOSIS — M47812 Spondylosis without myelopathy or radiculopathy, cervical region: Secondary | ICD-10-CM | POA: Diagnosis not present

## 2021-11-02 ENCOUNTER — Ambulatory Visit
Admission: RE | Admit: 2021-11-02 | Discharge: 2021-11-02 | Disposition: A | Discharge status: Home / Self Care | Payer: Medicare HMO | Source: Ambulatory Visit | Attending: Family Medicine | Admitting: Family Medicine

## 2021-11-02 ENCOUNTER — Other Ambulatory Visit: Payer: Self-pay

## 2021-11-02 DIAGNOSIS — M2578 Osteophyte, vertebrae: Secondary | ICD-10-CM | POA: Diagnosis not present

## 2021-11-02 DIAGNOSIS — M542 Cervicalgia: Secondary | ICD-10-CM | POA: Diagnosis not present

## 2021-11-02 DIAGNOSIS — M5412 Radiculopathy, cervical region: Secondary | ICD-10-CM | POA: Diagnosis not present

## 2021-11-02 DIAGNOSIS — M4802 Spinal stenosis, cervical region: Secondary | ICD-10-CM | POA: Diagnosis not present

## 2021-11-10 DIAGNOSIS — M5412 Radiculopathy, cervical region: Secondary | ICD-10-CM | POA: Diagnosis not present

## 2021-11-10 DIAGNOSIS — M5030 Other cervical disc degeneration, unspecified cervical region: Secondary | ICD-10-CM | POA: Diagnosis not present

## 2021-11-10 DIAGNOSIS — M47812 Spondylosis without myelopathy or radiculopathy, cervical region: Secondary | ICD-10-CM | POA: Diagnosis not present

## 2021-11-20 DIAGNOSIS — I480 Paroxysmal atrial fibrillation: Secondary | ICD-10-CM | POA: Diagnosis not present

## 2021-12-19 DIAGNOSIS — R739 Hyperglycemia, unspecified: Secondary | ICD-10-CM | POA: Diagnosis not present

## 2021-12-19 DIAGNOSIS — L299 Pruritus, unspecified: Secondary | ICD-10-CM | POA: Diagnosis not present

## 2021-12-19 DIAGNOSIS — R21 Rash and other nonspecific skin eruption: Secondary | ICD-10-CM | POA: Diagnosis not present

## 2021-12-22 DIAGNOSIS — M47812 Spondylosis without myelopathy or radiculopathy, cervical region: Secondary | ICD-10-CM | POA: Diagnosis not present

## 2022-01-01 DIAGNOSIS — M5412 Radiculopathy, cervical region: Secondary | ICD-10-CM | POA: Diagnosis not present

## 2022-01-01 DIAGNOSIS — M542 Cervicalgia: Secondary | ICD-10-CM | POA: Diagnosis not present

## 2022-01-01 DIAGNOSIS — M47812 Spondylosis without myelopathy or radiculopathy, cervical region: Secondary | ICD-10-CM | POA: Diagnosis not present

## 2022-01-01 DIAGNOSIS — M4802 Spinal stenosis, cervical region: Secondary | ICD-10-CM | POA: Diagnosis not present

## 2022-01-01 DIAGNOSIS — M5030 Other cervical disc degeneration, unspecified cervical region: Secondary | ICD-10-CM | POA: Diagnosis not present

## 2022-02-05 DIAGNOSIS — E782 Mixed hyperlipidemia: Secondary | ICD-10-CM | POA: Diagnosis not present

## 2022-02-05 DIAGNOSIS — R739 Hyperglycemia, unspecified: Secondary | ICD-10-CM | POA: Diagnosis not present

## 2022-02-05 DIAGNOSIS — E538 Deficiency of other specified B group vitamins: Secondary | ICD-10-CM | POA: Diagnosis not present

## 2022-02-12 DIAGNOSIS — G4733 Obstructive sleep apnea (adult) (pediatric): Secondary | ICD-10-CM | POA: Diagnosis not present

## 2022-02-12 DIAGNOSIS — I480 Paroxysmal atrial fibrillation: Secondary | ICD-10-CM | POA: Diagnosis not present

## 2022-02-12 DIAGNOSIS — I10 Essential (primary) hypertension: Secondary | ICD-10-CM | POA: Diagnosis not present

## 2022-02-12 DIAGNOSIS — E782 Mixed hyperlipidemia: Secondary | ICD-10-CM | POA: Diagnosis not present

## 2022-02-12 DIAGNOSIS — I6203 Nontraumatic chronic subdural hemorrhage: Secondary | ICD-10-CM | POA: Diagnosis not present

## 2022-02-12 DIAGNOSIS — R600 Localized edema: Secondary | ICD-10-CM | POA: Diagnosis not present

## 2022-03-06 DIAGNOSIS — M5412 Radiculopathy, cervical region: Secondary | ICD-10-CM | POA: Diagnosis not present

## 2022-03-30 DIAGNOSIS — M5030 Other cervical disc degeneration, unspecified cervical region: Secondary | ICD-10-CM | POA: Diagnosis not present

## 2022-03-30 DIAGNOSIS — I700 Atherosclerosis of aorta: Secondary | ICD-10-CM | POA: Diagnosis not present

## 2022-03-30 DIAGNOSIS — I517 Cardiomegaly: Secondary | ICD-10-CM | POA: Diagnosis not present

## 2022-03-30 DIAGNOSIS — J449 Chronic obstructive pulmonary disease, unspecified: Secondary | ICD-10-CM | POA: Diagnosis not present

## 2022-03-30 DIAGNOSIS — M4802 Spinal stenosis, cervical region: Secondary | ICD-10-CM | POA: Diagnosis not present

## 2022-03-30 DIAGNOSIS — M47814 Spondylosis without myelopathy or radiculopathy, thoracic region: Secondary | ICD-10-CM | POA: Diagnosis not present

## 2022-03-30 DIAGNOSIS — M5412 Radiculopathy, cervical region: Secondary | ICD-10-CM | POA: Diagnosis not present

## 2022-03-30 DIAGNOSIS — R222 Localized swelling, mass and lump, trunk: Secondary | ICD-10-CM | POA: Diagnosis not present

## 2022-05-02 DIAGNOSIS — G5603 Carpal tunnel syndrome, bilateral upper limbs: Secondary | ICD-10-CM | POA: Diagnosis not present

## 2022-05-02 DIAGNOSIS — M47812 Spondylosis without myelopathy or radiculopathy, cervical region: Secondary | ICD-10-CM | POA: Diagnosis not present

## 2022-05-02 DIAGNOSIS — Z6829 Body mass index (BMI) 29.0-29.9, adult: Secondary | ICD-10-CM | POA: Diagnosis not present

## 2022-05-28 DIAGNOSIS — M47812 Spondylosis without myelopathy or radiculopathy, cervical region: Secondary | ICD-10-CM | POA: Diagnosis not present

## 2022-06-13 DIAGNOSIS — R7303 Prediabetes: Secondary | ICD-10-CM | POA: Diagnosis not present

## 2022-06-13 DIAGNOSIS — E782 Mixed hyperlipidemia: Secondary | ICD-10-CM | POA: Diagnosis not present

## 2022-06-13 DIAGNOSIS — E538 Deficiency of other specified B group vitamins: Secondary | ICD-10-CM | POA: Diagnosis not present

## 2022-06-20 DIAGNOSIS — G5601 Carpal tunnel syndrome, right upper limb: Secondary | ICD-10-CM | POA: Diagnosis not present

## 2022-06-20 DIAGNOSIS — I6203 Nontraumatic chronic subdural hemorrhage: Secondary | ICD-10-CM | POA: Diagnosis not present

## 2022-06-20 DIAGNOSIS — Z1331 Encounter for screening for depression: Secondary | ICD-10-CM | POA: Diagnosis not present

## 2022-06-20 DIAGNOSIS — I480 Paroxysmal atrial fibrillation: Secondary | ICD-10-CM | POA: Diagnosis not present

## 2022-06-20 DIAGNOSIS — G4733 Obstructive sleep apnea (adult) (pediatric): Secondary | ICD-10-CM | POA: Diagnosis not present

## 2022-06-20 DIAGNOSIS — R600 Localized edema: Secondary | ICD-10-CM | POA: Diagnosis not present

## 2022-06-20 DIAGNOSIS — Z23 Encounter for immunization: Secondary | ICD-10-CM | POA: Diagnosis not present

## 2022-06-20 DIAGNOSIS — I10 Essential (primary) hypertension: Secondary | ICD-10-CM | POA: Diagnosis not present

## 2022-06-20 DIAGNOSIS — Z0000 Encounter for general adult medical examination without abnormal findings: Secondary | ICD-10-CM | POA: Diagnosis not present

## 2022-06-20 DIAGNOSIS — E785 Hyperlipidemia, unspecified: Secondary | ICD-10-CM | POA: Diagnosis not present

## 2022-06-21 DIAGNOSIS — M47812 Spondylosis without myelopathy or radiculopathy, cervical region: Secondary | ICD-10-CM | POA: Diagnosis not present

## 2022-07-09 DIAGNOSIS — M47812 Spondylosis without myelopathy or radiculopathy, cervical region: Secondary | ICD-10-CM | POA: Diagnosis not present

## 2022-08-01 DIAGNOSIS — G40909 Epilepsy, unspecified, not intractable, without status epilepticus: Secondary | ICD-10-CM | POA: Diagnosis not present

## 2022-08-01 DIAGNOSIS — I6203 Nontraumatic chronic subdural hemorrhage: Secondary | ICD-10-CM | POA: Diagnosis not present

## 2022-08-01 DIAGNOSIS — R251 Tremor, unspecified: Secondary | ICD-10-CM | POA: Diagnosis not present

## 2022-08-01 DIAGNOSIS — G5603 Carpal tunnel syndrome, bilateral upper limbs: Secondary | ICD-10-CM | POA: Diagnosis not present

## 2022-08-08 DIAGNOSIS — M4301 Spondylolysis, occipito-atlanto-axial region: Secondary | ICD-10-CM | POA: Diagnosis not present

## 2022-08-08 DIAGNOSIS — Z6829 Body mass index (BMI) 29.0-29.9, adult: Secondary | ICD-10-CM | POA: Diagnosis not present

## 2022-08-09 DIAGNOSIS — G5603 Carpal tunnel syndrome, bilateral upper limbs: Secondary | ICD-10-CM | POA: Diagnosis not present

## 2022-08-16 ENCOUNTER — Other Ambulatory Visit: Payer: Self-pay | Admitting: Neurosurgery

## 2022-08-16 DIAGNOSIS — M4301 Spondylolysis, occipito-atlanto-axial region: Secondary | ICD-10-CM

## 2022-08-17 ENCOUNTER — Ambulatory Visit
Admission: RE | Admit: 2022-08-17 | Discharge: 2022-08-17 | Disposition: A | Discharge status: Home / Self Care | Payer: Medicare HMO | Source: Ambulatory Visit | Attending: Neurosurgery | Admitting: Neurosurgery

## 2022-08-17 DIAGNOSIS — M4301 Spondylolysis, occipito-atlanto-axial region: Secondary | ICD-10-CM | POA: Diagnosis not present

## 2022-08-17 DIAGNOSIS — M542 Cervicalgia: Secondary | ICD-10-CM | POA: Diagnosis not present

## 2022-08-17 DIAGNOSIS — M4312 Spondylolisthesis, cervical region: Secondary | ICD-10-CM | POA: Diagnosis not present

## 2022-08-21 DIAGNOSIS — E782 Mixed hyperlipidemia: Secondary | ICD-10-CM | POA: Diagnosis not present

## 2022-08-21 DIAGNOSIS — I10 Essential (primary) hypertension: Secondary | ICD-10-CM | POA: Diagnosis not present

## 2022-08-21 DIAGNOSIS — G4733 Obstructive sleep apnea (adult) (pediatric): Secondary | ICD-10-CM | POA: Diagnosis not present

## 2022-08-21 DIAGNOSIS — I482 Chronic atrial fibrillation, unspecified: Secondary | ICD-10-CM | POA: Diagnosis not present

## 2022-08-21 DIAGNOSIS — I480 Paroxysmal atrial fibrillation: Secondary | ICD-10-CM | POA: Diagnosis not present

## 2022-08-21 DIAGNOSIS — R600 Localized edema: Secondary | ICD-10-CM | POA: Diagnosis not present

## 2022-09-04 DIAGNOSIS — M4301 Spondylolysis, occipito-atlanto-axial region: Secondary | ICD-10-CM | POA: Diagnosis not present

## 2022-09-04 DIAGNOSIS — Z6830 Body mass index (BMI) 30.0-30.9, adult: Secondary | ICD-10-CM | POA: Diagnosis not present

## 2022-09-10 DIAGNOSIS — J40 Bronchitis, not specified as acute or chronic: Secondary | ICD-10-CM | POA: Diagnosis not present

## 2022-10-02 DIAGNOSIS — M47812 Spondylosis without myelopathy or radiculopathy, cervical region: Secondary | ICD-10-CM | POA: Diagnosis not present

## 2022-10-24 DIAGNOSIS — Z6829 Body mass index (BMI) 29.0-29.9, adult: Secondary | ICD-10-CM | POA: Diagnosis not present

## 2022-10-24 DIAGNOSIS — M4301 Spondylolysis, occipito-atlanto-axial region: Secondary | ICD-10-CM | POA: Diagnosis not present

## 2022-12-11 DIAGNOSIS — R7303 Prediabetes: Secondary | ICD-10-CM | POA: Diagnosis not present

## 2022-12-11 DIAGNOSIS — E538 Deficiency of other specified B group vitamins: Secondary | ICD-10-CM | POA: Diagnosis not present

## 2022-12-11 DIAGNOSIS — E782 Mixed hyperlipidemia: Secondary | ICD-10-CM | POA: Diagnosis not present

## 2022-12-11 DIAGNOSIS — G4733 Obstructive sleep apnea (adult) (pediatric): Secondary | ICD-10-CM | POA: Diagnosis not present

## 2022-12-12 ENCOUNTER — Encounter: Payer: Self-pay | Admitting: Dermatology

## 2022-12-12 ENCOUNTER — Ambulatory Visit: Payer: Medicare HMO | Admitting: Dermatology

## 2022-12-12 VITALS — BP 143/96 | HR 66

## 2022-12-12 DIAGNOSIS — L570 Actinic keratosis: Secondary | ICD-10-CM | POA: Diagnosis not present

## 2022-12-12 DIAGNOSIS — Z7189 Other specified counseling: Secondary | ICD-10-CM

## 2022-12-12 DIAGNOSIS — L821 Other seborrheic keratosis: Secondary | ICD-10-CM

## 2022-12-12 DIAGNOSIS — L578 Other skin changes due to chronic exposure to nonionizing radiation: Secondary | ICD-10-CM

## 2022-12-12 DIAGNOSIS — L720 Epidermal cyst: Secondary | ICD-10-CM | POA: Diagnosis not present

## 2022-12-12 NOTE — Progress Notes (Signed)
New Patient Visit  Subjective  Preston Hill is a 89 y.o. male who presents for the following: New Patient (Initial Visit) (Reports spot at right cheek and left ear lobe. ). The patient has spots, moles and lesions to be evaluated, some may be new or changing and the patient has concerns that these could be cancer.  The following portions of the chart were reviewed this encounter and updated as appropriate:   Tobacco  Allergies  Meds  Problems  Med Hx  Surg Hx  Fam Hx     Review of Systems:  No other skin or systemic complaints except as noted in HPI or Assessment and Plan.  Objective  Well appearing patient in no apparent distress; mood and affect are within normal limits.  A focused examination was performed including face and ears. Relevant physical exam findings are noted in the Assessment and Plan.  face and ears x 18 (18) Erythematous thin papules/macules with gritty scale.   right cheek 1.5 cm cystic papule         Assessment & Plan  Actinic keratosis (18) face and ears x 18 Actinic keratoses are precancerous spots that appear secondary to cumulative UV radiation exposure/sun exposure over time. They are chronic with expected duration over 1 year. A portion of actinic keratoses will progress to squamous cell carcinoma of the skin. It is not possible to reliably predict which spots will progress to skin cancer and so treatment is recommended to prevent development of skin cancer.  Recommend daily broad spectrum sunscreen SPF 30+ to sun-exposed areas, reapply every 2 hours as needed.  Recommend staying in the shade or wearing long sleeves, sun glasses (UVA+UVB protection) and wide brim hats (4-inch brim around the entire circumference of the hat). Call for new or changing lesions.  Destruction of lesion - face and ears x 18 Complexity: simple   Destruction method: cryotherapy   Informed consent: discussed and consent obtained   Timeout:  patient name, date of  birth, surgical site, and procedure verified Lesion destroyed using liquid nitrogen: Yes   Region frozen until ice ball extended beyond lesion: Yes   Outcome: patient tolerated procedure well with no complications   Post-procedure details: wound care instructions given   Additional details:  Prior to procedure, discussed risks of blister formation, small wound, skin dyspigmentation, or rare scar following cryotherapy. Recommend Vaseline ointment to treated areas while healing.  Epidermal inclusion cyst right cheek Cyst with symptoms and/or recent change.  Discussed surgical excision to remove, including resulting scar and possible recurrence.  Patient will schedule for surgery. Pre-op information given. Patient is bothered by cyst and would like removed Patient scheduled for surgery.  Seborrheic Keratoses - Stuck-on, waxy, tan-brown papules and/or plaques  - Benign-appearing - Discussed benign etiology and prognosis. - Observe - Call for any changes  Actinic Damage - chronic, secondary to cumulative UV radiation exposure/sun exposure over time - diffuse scaly erythematous macules with underlying dyspigmentation - Recommend daily broad spectrum sunscreen SPF 30+ to sun-exposed areas, reapply every 2 hours as needed.  - Recommend staying in the shade or wearing long sleeves, sun glasses (UVA+UVB protection) and wide brim hats (4-inch brim around the entire circumference of the hat). - Call for new or changing lesions.  To be scheduled for surgery  I, Ruthell Rummage, CMA, am acting as scribe for Sarina Ser, MD. Documentation: I have reviewed the above documentation for accuracy and completeness, and I agree with the above.  Sarina Ser, MD

## 2022-12-12 NOTE — Patient Instructions (Addendum)
Pre-Operative Instructions  You are scheduled for a surgical procedure at [facility]. We recommend you read the following instructions. If you have any questions or concerns, please call the office at [phone].  Shower and wash the entire body with soap and water the day of your surgery paying special attention to cleansing at and around the planned surgery site.  Avoid aspirin or aspirin containing products at least fourteen (14) days prior to your surgical procedure and for at least one week (7 Days) after your surgical procedure. If you take aspirin on a regular basis for heart disease or history of stroke or for any other reason, we may recommend you continue taking aspirin but please notify us if you take this on a regular basis. Aspirin can cause more bleeding to occur during surgery as well as prolonged bleeding and bruising after surgery.   Avoid other nonsteroidal pain medications at least one week prior to surgery and at least one week prior to your surgery. These include medications such as Ibuprofen (Motrin, Advil and Nuprin), Naprosyn, Voltaren, Relafen, etc. If medications are used for therapeutic reasons, please inform us as they can cause increased bleeding or prolonged bleeding during and bruising after surgical procedures.   Please advise [place] if you are taking any "blood thinner" medications such as Coumadin or Dipyridamole or Plavix or similar medications. These cause increased bleeding and prolonged bleeding during procedures and bruising after surgical procedures. We may have to consider discontinuing these medications briefly prior to and shortly after your surgery if safe to do so.   Please inform us of all medications you are currently taking. All medications that are taken regularly should be taken the day of surgery as you always do. Nevertheless, we need to be informed of what medications you are taking prior to surgery to know whether they will affect the  procedure or cause any complications.   Please inform us of any medication allergies. Also inform us of whether you have allergies to Latex or rubber products or whether you have had any adverse reaction to Lidocaine or Epinephrine.  Please inform us of any prosthetic or artificial body parts such as artificial heart valve, joint replacements, etc., or similar condition that might require preoperative antibiotics.   We recommend avoidance of alcohol at least two weeks prior to surgery and continued avoidance for at least two weeks after surgery.   We recommend discontinuation of tobacco smoking at least two weeks prior to surgery and continued abstinence for at least two weeks after surgery.  Do not plan strenuous exercise, strenuous work or strenuous lifting for approximately four weeks after your surgery.   We request if you are unable to make your scheduled surgical appointment, please call us at least a week in advance or as soon as you are aware of a problem so that we can cancel or reschedule the appointment.   You MAY TAKE TYLENOL (acetaminophen) for pain as it is not a blood thinner.   PLEASE PLAN TO BE IN TOWN FOR TWO WEEKS FOLLOWING SURGERY, THIS IS IMPORTANT SO YOU CAN BE CHECKED FOR DRESSING CHANGES, SUTURE REMOVAL AND TO MONITOR FOR POSSIBLE COMPLICATIONS.       Cryotherapy Aftercare  Wash gently with soap and water everyday.   Apply Vaseline and Band-Aid daily until healed.   Actinic keratoses are precancerous spots that appear secondary to cumulative UV radiation exposure/sun exposure over time. They are chronic with expected duration over 1 year. A portion of actinic  keratoses will progress to squamous cell carcinoma of the skin. It is not possible to reliably predict which spots will progress to skin cancer and so treatment is recommended to prevent development of skin cancer.  Recommend daily broad spectrum sunscreen SPF 30+ to sun-exposed areas, reapply every 2 hours  as needed.  Recommend staying in the shade or wearing long sleeves, sun glasses (UVA+UVB protection) and wide brim hats (4-inch brim around the entire circumference of the hat). Call for new or changing lesions.        Due to recent changes in healthcare laws, you may see results of your pathology and/or laboratory studies on MyChart before the doctors have had a chance to review them. We understand that in some cases there may be results that are confusing or concerning to you. Please understand that not all results are received at the same time and often the doctors may need to interpret multiple results in order to provide you with the best plan of care or course of treatment. Therefore, we ask that you please give [place] 2 business days to thoroughly review all your results before contacting the office for clarification. Should we see a critical lab result, you will be contacted sooner.   If You Need Anything After Your Visit  If you have any questions or concerns for your doctor, please call our main line at [phone] and press option 4 to reach your doctor's medical assistant. If no one answers, please leave a voicemail as directed and we will return your call as soon as possible. Messages left after 4 pm will be answered the following business day.   You may also send [place] a message via [place]. We typically respond to MyChart messages within 1-2 business days.  For prescription refills, please ask your pharmacy to contact our office. Our fax number is [phone].  If you have an urgent issue when the clinic is closed that cannot wait until the next business day, you can page your doctor at the number below.    Please note that while we do our best to be available for urgent issues outside of office hours, we are not available 24/7.   If you have an urgent issue and are unable to reach [place], you may choose to seek medical care at your doctor's office, retail clinic, urgent care center, or  emergency room.  If you have a medical emergency, please immediately call 911 or go to the emergency department.  Pager Numbers  - Dr. Nehemiah Massed: [phone]  - Dr. Laurence Ferrari: [phone]  - Dr. Nicole Kindred: [phone]  In the event of inclement weather, please call our main line at [phone] for an update on the status of any delays or closures.  Dermatology Medication Tips: Please keep the boxes that topical medications come in in order to help keep track of the instructions about where and how to use these. Pharmacies typically print the medication instructions only on the boxes and not directly on the medication tubes.   If your medication is too expensive, please contact our office at [phone] option 4 or send [place] a message through [place].   We are unable to tell what your co-pay for medications will be in advance as this is different depending on your insurance coverage. However, we may be able to find a substitute medication at lower cost or fill out paperwork to get insurance to cover a needed medication.   If a prior authorization is required to get your medication covered by  your insurance company, please allow [place] 1-2 business days to complete this process.  Drug prices often vary depending on where the prescription is filled and some pharmacies may offer cheaper prices.  The website www.goodrx.com contains coupons for medications through different pharmacies. The prices here do not account for what the cost may be with help from insurance (it may be cheaper with your insurance), but the website can give you the price if you did not use any insurance.  - You can print the associated coupon and take it with your prescription to the pharmacy.  - You may also stop by our office during regular business hours and pick up a GoodRx coupon card.  - If you need your prescription sent electronically to a different pharmacy, notify our office through [facility] or by phone at  [phone] option 4.     Si Usted Necesita Algo Despus de Su Visita  Tambin puede enviarnos un mensaje a travs de Pharmacist, community. Por lo general respondemos a los mensajes de MyChart en el transcurso de 1 a 2 das hbiles.  Para renovar recetas, por favor pida a su farmacia que se ponga en contacto con nuestra oficina. Harland Dingwall de fax es [place] [phone].  Si tiene un asunto urgente cuando la clnica est cerrada y que no puede esperar hasta el siguiente da hbil, puede llamar/localizar a su doctor(a) al nmero que aparece a continuacin.   Por favor, tenga en cuenta que aunque hacemos todo lo posible para estar disponibles para asuntos urgentes fuera del horario de [place], no estamos disponibles las 24 horas del da, los 7 das de la [place].   Si tiene un problema urgente y no puede comunicarse con nosotros, puede optar por buscar atencin mdica  en el consultorio de su doctor(a), en una clnica privada, en un centro de atencin urgente o en una sala de emergencias.  Si tiene Engineering geologist, por favor llame inmediatamente al 911 o vaya a la sala de emergencias.  Nmeros de bper  - Dr. Nehemiah Massed: [phone]  - Dra. Moye: [phone]  - Dra. Nicole Kindred: [phone]  En caso de inclemencias del [place], por favor llame a Johnsie Kindred principal al [phone] para una actualizacin sobre el [place] de cualquier retraso o cierre.  Consejos para la medicacin en dermatologa: Por favor, guarde las cajas en las que vienen los medicamentos de uso tpico para ayudarle a seguir las instrucciones sobre dnde y cmo usarlos. Las farmacias generalmente imprimen las instrucciones del medicamento slo en las cajas y no directamente en los tubos del [place].   Si su medicamento es muy caro, por favor, pngase en contacto con Zigmund Daniel llamando al [phone] y presione la opcin 4 o envenos un mensaje a travs de Pharmacist, community.   No podemos decirle cul ser su copago por los  medicamentos por adelantado ya que esto es diferente dependiendo de la cobertura de su seguro. Sin embargo, es posible que podamos encontrar un medicamento sustituto a Electrical engineer un formulario para que el seguro cubra el medicamento que se considera necesario.   Si se requiere una autorizacin previa para que su compaa de seguros [place] su medicamento, por favor permtanos de 1 a 2 das hbiles para completar este proceso.  Los precios de los medicamentos varan con frecuencia dependiendo del Environmental consultant de dnde se surte la receta y alguna farmacias pueden ofrecer precios ms baratos.  El sitio web www.goodrx.com tiene cupones para medicamentos de Airline pilot. Los precios aqu no tienen  en cuenta lo que podra costar con la ayuda del seguro (puede ser ms barato con su seguro), pero el sitio web puede darle el precio si no Field seismologist.  - Puede imprimir el cupn correspondiente y llevarlo con su receta a la farmacia.  - Tambin puede pasar por nuestra oficina durante el horario de atencin regular y Charity fundraiser una tarjeta de cupones de GoodRx.  - Si necesita que su receta se enve electrnicamente a una farmacia diferente, informe a nuestra oficina a travs de MyChart de [place] o por telfono llamando al [phone] y presione la opcin 4.

## 2022-12-14 ENCOUNTER — Encounter: Payer: Self-pay | Admitting: Dermatology

## 2022-12-18 DIAGNOSIS — G40909 Epilepsy, unspecified, not intractable, without status epilepticus: Secondary | ICD-10-CM | POA: Diagnosis not present

## 2022-12-18 DIAGNOSIS — I480 Paroxysmal atrial fibrillation: Secondary | ICD-10-CM | POA: Diagnosis not present

## 2022-12-18 DIAGNOSIS — G4733 Obstructive sleep apnea (adult) (pediatric): Secondary | ICD-10-CM | POA: Diagnosis not present

## 2022-12-18 DIAGNOSIS — E785 Hyperlipidemia, unspecified: Secondary | ICD-10-CM | POA: Diagnosis not present

## 2022-12-18 DIAGNOSIS — I6203 Nontraumatic chronic subdural hemorrhage: Secondary | ICD-10-CM | POA: Diagnosis not present

## 2022-12-18 DIAGNOSIS — I10 Essential (primary) hypertension: Secondary | ICD-10-CM | POA: Diagnosis not present

## 2022-12-18 DIAGNOSIS — R600 Localized edema: Secondary | ICD-10-CM | POA: Diagnosis not present

## 2023-01-01 DIAGNOSIS — D7219 Other eosinophilia: Secondary | ICD-10-CM | POA: Diagnosis not present

## 2023-01-01 DIAGNOSIS — E876 Hypokalemia: Secondary | ICD-10-CM | POA: Diagnosis not present

## 2023-01-15 ENCOUNTER — Ambulatory Visit (INDEPENDENT_AMBULATORY_CARE_PROVIDER_SITE_OTHER): Payer: Medicare HMO | Admitting: Dermatology

## 2023-01-15 DIAGNOSIS — C44319 Basal cell carcinoma of skin of other parts of face: Secondary | ICD-10-CM | POA: Diagnosis not present

## 2023-01-15 DIAGNOSIS — C4491 Basal cell carcinoma of skin, unspecified: Secondary | ICD-10-CM

## 2023-01-15 DIAGNOSIS — D485 Neoplasm of uncertain behavior of skin: Secondary | ICD-10-CM

## 2023-01-15 HISTORY — DX: Basal cell carcinoma of skin, unspecified: C44.91

## 2023-01-15 MED ORDER — MUPIROCIN 2 % EX OINT
1.0000 | TOPICAL_OINTMENT | Freq: Every day | CUTANEOUS | 1 refills | Status: AC
Start: 2023-01-15 — End: ?

## 2023-01-15 NOTE — Patient Instructions (Addendum)

## 2023-01-15 NOTE — Progress Notes (Signed)
   Follow-Up Visit   Subjective  Preston Hill is a 89 y.o. male who presents for the following: Cyst (Vs other of right cheek - excise today).  The following portions of the chart were reviewed this encounter and updated as appropriate:   Tobacco  Allergies  Meds  Problems  Med Hx  Surg Hx  Fam Hx     Review of Systems:  No other skin or systemic complaints except as noted in HPI or Assessment and Plan.  Objective  Well appearing patient in no apparent distress; mood and affect are within normal limits.  A focused examination was performed including face. Relevant physical exam findings are noted in the Assessment and Plan.  Right cheek Cystic papule 1.5 x 2.1cm   Assessment & Plan  Neoplasm of uncertain behavior of skin Right cheek  Skin excision  Lesion length (cm):  1.5 Lesion width (cm):  2.1 Margin per side (cm):  0 Total excision diameter (cm):  2.1 Informed consent: discussed and consent obtained   Timeout: patient name, date of birth, surgical site, and procedure verified   Procedure prep:  Patient was prepped and draped in usual sterile fashion Prep type:  Isopropyl alcohol and povidone-iodine Anesthesia: the lesion was anesthetized in a standard fashion   Anesthetic:  0.5% bupivicaine w/ epinephrine 1-100,000 nerve block (6cc lido w/ epi) Instrument used: #15 blade   Instrument used comment:  #15c blade Hemostasis achieved with: pressure   Hemostasis achieved with comment:  Electrocautery Outcome: patient tolerated procedure well with no complications   Post-procedure details: sterile dressing applied and wound care instructions given   Dressing type: bandage, pressure dressing and bacitracin (mupirocin)    Skin repair Complexity:  Complex Final length (cm):  2.1 Reason for type of repair: reduce tension to allow closure, reduce the risk of dehiscence, infection, and necrosis, reduce subcutaneous dead space and avoid a hematoma, allow closure of the  large defect, preserve normal anatomy, preserve normal anatomical and functional relationships and enhance both functionality and cosmetic results   Undermining: area extensively undermined   Undermining comment:  Undermining defect 2.1cm Subcutaneous layers (deep stitches):  Suture size:  5-0 Suture type: Vicryl (polyglactin 910)   Subcutaneous suture technique: inverted dermal. Fine/surface layer approximation (top stitches):  Suture size:  5-0 Suture type: nylon   Stitches: simple running   Suture removal (days):  7 Hemostasis achieved with: suture and pressure Outcome: patient tolerated procedure well with no complications   Post-procedure details: sterile dressing applied and wound care instructions given   Dressing type: bandage, pressure dressing and bacitracin (mupirocin)    mupirocin ointment (BACTROBAN) 2 % Apply 1 Application topically daily. Qd to excision site  Specimen 1 - Surgical pathology Differential Diagnosis: Cyst vs other  Check Margins: No Cystic papule 1.5 x 2.1cm  Basal cell carcinoma (BCC) of skin of other part of face   Return in about 1 week (around 01/22/2023) for suture removal.  I, Ardis Rowan, RMA, am acting as scribe for Armida Sans, MD . Documentation: I have reviewed the above documentation for accuracy and completeness, and I agree with the above.  Armida Sans, MD

## 2023-01-16 ENCOUNTER — Telehealth: Payer: Self-pay

## 2023-01-16 NOTE — Telephone Encounter (Signed)
Patient doing fine after yesterdays surgery./sh 

## 2023-01-22 ENCOUNTER — Ambulatory Visit (INDEPENDENT_AMBULATORY_CARE_PROVIDER_SITE_OTHER): Payer: Medicare HMO | Admitting: Dermatology

## 2023-01-22 ENCOUNTER — Encounter: Payer: Self-pay | Admitting: Dermatology

## 2023-01-22 VITALS — BP 100/68 | HR 68

## 2023-01-22 DIAGNOSIS — Z85828 Personal history of other malignant neoplasm of skin: Secondary | ICD-10-CM

## 2023-01-22 DIAGNOSIS — Z4802 Encounter for removal of sutures: Secondary | ICD-10-CM

## 2023-01-22 NOTE — Progress Notes (Signed)
   Follow-Up Visit   Subjective  Preston Hill is a 89 y.o. male who presents for the following: Suture removal - R cheek   Pathology showed BCC with positive margins  The following portions of the chart were reviewed this encounter and updated as appropriate: medications, allergies, medical history  Review of Systems:  No other skin or systemic complaints except as noted in HPI or Assessment and Plan.  Objective  Well appearing patient in no apparent distress; mood and affect are within normal limits.  Areas Examined: The face  Relevant physical exam findings are noted in the Assessment and Plan.    Assessment & Plan    Encounter for Removal of Sutures - Incision site is clean, dry and intact. - Wound cleansed, sutures removed, wound cleansed and steri strips applied.  - Discussed pathology results showing BCC with positive margins. Will discuss pathology results with pathologist. If further treatment is needed recommend topical chemotherapy or ED&C.  - Patient advised to keep steri-strips dry until they fall off. - Scars remodel for a full year. - Once steri-strips fall off, patient can apply over-the-counter silicone scar cream once to twice a day to help with scar remodeling if desired. - Patient advised to call with any concerns or if they notice any new or changing lesions.  Return for [facility] recheck in 6-12 wks schedule on a Tuesday at a post op time.  Maylene Roes, CMA, am acting as scribe for Armida Sans, MD .   Documentation: I have reviewed the above documentation for accuracy and completeness, and I agree with the above.  Armida Sans, MD

## 2023-01-22 NOTE — Patient Instructions (Signed)
Due to recent changes in healthcare laws, you may see results of your pathology and/or laboratory studies on MyChart before the doctors have had a chance to review them. We understand that in some cases there may be results that are confusing or concerning to you. Please understand that not all results are received at the same time and often the doctors may need to interpret multiple results in order to provide you with the best plan of care or course of treatment. Therefore, we ask that you please give us 2 business days to thoroughly review all your results before contacting the office for clarification. Should we see a critical lab result, you will be contacted sooner.   If You Need Anything After Your Visit  If you have any questions or concerns for your doctor, please call our main line at [phone] and press option 4 to reach your doctor's medical assistant. If no one answers, please leave a voicemail as directed and we will return your call as soon as possible. Messages left after 4 pm will be answered the following business day.   You may also send us a message via MyChart. We typically respond to MyChart messages within 1-2 business days.  For prescription refills, please ask your pharmacy to contact our office. Our fax number is [phone].  If you have an urgent issue when the clinic is closed that cannot wait until the next business day, you can page your doctor at the number below.    Please note that while we do our best to be available for urgent issues outside of office hours, we are not available 24/7.   If you have an urgent issue and are unable to reach us, you may choose to seek medical care at your doctor's office, retail clinic, urgent care center, or emergency room.  If you have a medical emergency, please immediately call 911 or go to the emergency department.  Pager Numbers  - Dr. Kowalski: [phone]  - Dr. Moye: [phone]  - Dr. Stewart:  [phone]  In the event of inclement weather, please call our main line at [phone] for an update on the status of any delays or closures.  Dermatology Medication Tips: Please keep the boxes that topical medications come in in order to help keep track of the instructions about where and how to use these. Pharmacies typically print the medication instructions only on the boxes and not directly on the medication tubes.   If your medication is too expensive, please contact our office at [phone] option 4 or send us a message through MyChart.   We are unable to tell what your co-pay for medications will be in advance as this is different depending on your insurance coverage. However, we may be able to find a substitute medication at lower cost or fill out paperwork to get insurance to cover a needed medication.   If a prior authorization is required to get your medication covered by your insurance company, please allow us 1-2 business days to complete this process.  Drug prices often vary depending on where the prescription is filled and some pharmacies may offer cheaper prices.  The website www.goodrx.com contains coupons for medications through different pharmacies. The prices here do not account for what the cost may be with help from insurance (it may be cheaper with your insurance), but the website can give you the price if you did not use any insurance.  - You can print the associated coupon and take it with   your prescription to the pharmacy.  - You may also stop by our office during regular business hours and pick up a GoodRx coupon card.  - If you need your prescription sent electronically to a different pharmacy, notify our office through [place] MyChart or by phone at [phone] option 4.     Si Usted Necesita Algo Despus de Su Visita  Tambin puede enviarnos un mensaje a travs de MyChart. Por lo general respondemos a los mensajes de MyChart en el transcurso de 1 a 2  das hbiles.  Para renovar recetas, por favor pida a su farmacia que se ponga en contacto con nuestra oficina. Nuestro nmero de fax es el [phone].  Si tiene un asunto urgente cuando la clnica est cerrada y que no puede esperar hasta el siguiente da hbil, puede llamar/localizar a su doctor(a) al nmero que aparece a continuacin.   Por favor, tenga en cuenta que aunque hacemos todo lo posible para estar disponibles para asuntos urgentes fuera del horario de oficina, no estamos disponibles las 24 horas del da, los 7 das de la semana.   Si tiene un problema urgente y no puede comunicarse con nosotros, puede optar por buscar atencin mdica  en el consultorio de su doctor(a), en una clnica privada, en un centro de atencin urgente o en una sala de emergencias.  Si tiene una emergencia mdica, por favor llame inmediatamente al 911 o vaya a la sala de emergencias.  Nmeros de bper  - Dr. Kowalski: [phone]  - Dra. Moye: [phone]  - Dra. Stewart: [phone]  En caso de inclemencias del tiempo, por favor llame a nuestra lnea principal al [phone] para una actualizacin sobre el [place] de cualquier retraso o cierre.  Consejos para la medicacin en dermatologa: Por favor, guarde las cajas en las que vienen los medicamentos de uso tpico para ayudarle a seguir las instrucciones sobre dnde y cmo usarlos. Las farmacias generalmente imprimen las instrucciones del medicamento slo en las cajas y no directamente en los tubos del medicamento.   Si su medicamento es muy caro, por favor, pngase en contacto con nuestra oficina llamando al [phone] y presione la opcin 4 o envenos un mensaje a travs de MyChart.   No podemos decirle cul ser su copago por los medicamentos por adelantado ya que esto es diferente dependiendo de la cobertura de su seguro. Sin embargo, es posible que podamos encontrar un medicamento sustituto a menor costo o llenar un formulario para que el  seguro cubra el medicamento que se considera necesario.   Si se requiere una autorizacin previa para que su compaa de seguros cubra su medicamento, por favor permtanos de 1 a 2 das hbiles para completar este proceso.  Los precios de los medicamentos varan con frecuencia dependiendo del lugar de dnde se surte la receta y alguna farmacias pueden ofrecer precios ms baratos.  El sitio web www.goodrx.com tiene cupones para medicamentos de diferentes farmacias. Los precios aqu no tienen en cuenta lo que podra costar con la ayuda del seguro (puede ser ms barato con su seguro), pero el sitio web puede darle el precio si no utiliz ningn seguro.  - Puede imprimir el cupn correspondiente y llevarlo con su receta a la farmacia.  - Tambin puede pasar por nuestra oficina durante el horario de atencin regular y recoger una tarjeta de cupones de GoodRx.  - Si necesita que su receta se enve electrnicamente a una farmacia diferente, informe a nuestra oficina a travs de MyChart de [place]   o por telfono llamando al [phone] y presione la opcin 4.  

## 2023-01-24 DIAGNOSIS — R600 Localized edema: Secondary | ICD-10-CM | POA: Diagnosis not present

## 2023-01-28 ENCOUNTER — Encounter: Payer: Self-pay | Admitting: Dermatology

## 2023-01-29 ENCOUNTER — Telehealth: Payer: Self-pay

## 2023-01-29 NOTE — Telephone Encounter (Signed)
Per Dr. Gwen Pounds, "Please contact pathology and ask if it appears that [facility] has been transected or if it appears close to margin. Thanks" I spoke with Tiffany, Dr. Kinnie Feil assistant and she will contact us with Dr. Kinnie Feil response.

## 2023-02-26 DIAGNOSIS — G5603 Carpal tunnel syndrome, bilateral upper limbs: Secondary | ICD-10-CM | POA: Diagnosis not present

## 2023-02-26 DIAGNOSIS — M542 Cervicalgia: Secondary | ICD-10-CM | POA: Diagnosis not present

## 2023-03-26 ENCOUNTER — Encounter: Payer: Self-pay | Admitting: Dermatology

## 2023-03-26 ENCOUNTER — Ambulatory Visit: Payer: Medicare HMO | Admitting: Dermatology

## 2023-03-26 VITALS — BP 148/88

## 2023-03-26 DIAGNOSIS — L578 Other skin changes due to chronic exposure to nonionizing radiation: Secondary | ICD-10-CM | POA: Diagnosis not present

## 2023-03-26 DIAGNOSIS — L570 Actinic keratosis: Secondary | ICD-10-CM | POA: Diagnosis not present

## 2023-03-26 DIAGNOSIS — D492 Neoplasm of unspecified behavior of bone, soft tissue, and skin: Secondary | ICD-10-CM

## 2023-03-26 DIAGNOSIS — L820 Inflamed seborrheic keratosis: Secondary | ICD-10-CM

## 2023-03-26 DIAGNOSIS — Z85828 Personal history of other malignant neoplasm of skin: Secondary | ICD-10-CM

## 2023-03-26 DIAGNOSIS — W908XXA Exposure to other nonionizing radiation, initial encounter: Secondary | ICD-10-CM

## 2023-03-26 DIAGNOSIS — C44320 Squamous cell carcinoma of skin of unspecified parts of face: Secondary | ICD-10-CM

## 2023-03-26 DIAGNOSIS — C44329 Squamous cell carcinoma of skin of other parts of face: Secondary | ICD-10-CM | POA: Diagnosis not present

## 2023-03-26 NOTE — Progress Notes (Unsigned)
Follow-Up Visit   Subjective  Preston Hill is a 89 y.o. male who presents for the following: BCC with peripheral margin involved, R cheek 58m f/u, pt has new spot R cheek below [facility]  The patient has spots, moles and lesions to be evaluated, some may be new or changing and the patient may have concern these could be cancer.  The following portions of the chart were reviewed this encounter and updated as appropriate: medications, allergies, medical history  Review of Systems:  No other skin or systemic complaints except as noted in HPI or Assessment and Plan.  Objective  Well appearing patient in no apparent distress; mood and affect are within normal limits. A focused examination was performed of the following areas: face Relevant exam findings are noted in the Assessment and Plan.  L ring finger x 1 Hyperkeratotic pap  R cheek Hyperkeratotic pap 1.1cm     R cheek around [facility] excision site x 3 (3) Stuck on waxy paps with erythema   Assessment & Plan   HISTORY OF BASAL CELL CARCINOMA OF THE SKIN - path with + margin at edge - No evidence of recurrence today - Recommend regular full body skin exams - Recommend daily broad spectrum sunscreen SPF 30+ to sun-exposed areas, reapply every 2 hours as needed.  - Call if any new or changing lesions are noted between office visits  - R cheek excision site clear today, may consider field treatment in the future since there was peripheral margin involved  Hypertrophic actinic keratosis L ring finger x 1  Destruction of lesion - L ring finger x 1 Complexity: simple   Destruction method: cryotherapy   Informed consent: discussed and consent obtained   Timeout:  patient name, date of birth, surgical site, and procedure verified Lesion destroyed using liquid nitrogen: Yes   Region frozen until ice ball extended beyond lesion: Yes   Outcome: patient tolerated procedure well with no complications   Post-procedure details: wound care  instructions given    Neoplasm of skin R cheek  Epidermal / dermal shaving  Informed consent: discussed and consent obtained   Timeout: patient name, date of birth, surgical site, and procedure verified   Procedure prep:  Patient was prepped and draped in usual sterile fashion Prep type:  Isopropyl alcohol Anesthesia: the lesion was anesthetized in a standard fashion   Anesthetic:  1% lidocaine w/ epinephrine 1-100,000 buffered w/ 8.4% NaHCO3 Instrument used: flexible razor blade   Hemostasis achieved with: pressure, aluminum chloride and electrodesiccation   Outcome: patient tolerated procedure well   Post-procedure details: sterile dressing applied and wound care instructions given   Dressing type: bandage and bacitracin    Destruction of lesion Complexity: extensive   Destruction method: electrodesiccation and curettage   Informed consent: discussed and consent obtained   Timeout:  patient name, date of birth, surgical site, and procedure verified Procedure prep:  Patient was prepped and draped in usual sterile fashion Prep type:  Isopropyl alcohol Anesthesia: the lesion was anesthetized in a standard fashion   Anesthetic:  1% lidocaine w/ epinephrine 1-100,000 buffered w/ 8.4% NaHCO3 Curettage performed in three different directions: Yes   Electrodesiccation performed over the curetted area: Yes   Final wound size (cm):  1.1 Hemostasis achieved with:  pressure, aluminum chloride and electrodesiccation Outcome: patient tolerated procedure well with no complications   Post-procedure details: sterile dressing applied and wound care instructions given   Dressing type: bandage and petrolatum    Specimen 1 -  Surgical pathology Differential Diagnosis: D48.5 R/O SCC  Check Margins: No EDC today  Inflamed seborrheic keratosis (3) R cheek around [facility] excision site x 3  Symptomatic, irritating, patient would like treated.   Destruction of lesion - R cheek around [facility] excision  site x 3 Complexity: simple   Destruction method: cryotherapy   Informed consent: discussed and consent obtained   Timeout:  patient name, date of birth, surgical site, and procedure verified Lesion destroyed using liquid nitrogen: Yes   Region frozen until ice ball extended beyond lesion: Yes   Outcome: patient tolerated procedure well with no complications   Post-procedure details: wound care instructions given    Actinic skin damage  History of basal cell carcinoma  ACTINIC DAMAGE - chronic, secondary to cumulative UV radiation exposure/sun exposure over time - diffuse scaly erythematous macules with underlying dyspigmentation - Recommend daily broad spectrum sunscreen SPF 30+ to sun-exposed areas, reapply every 2 hours as needed.  - Recommend staying in the shade or wearing long sleeves, sun glasses (UVA+UVB protection) and wide brim hats (4-inch brim around the entire circumference of the hat). - Call for new or changing lesions.    Return in about 6 months (around 09/25/2023) for UBSE, Hx of BCC, Hx of AKs.  I, Ardis Rowan, RMA, am acting as scribe for Armida Sans, MD .  Documentation: I have reviewed the above documentation for accuracy and completeness, and I agree with the above.  Armida Sans, MD

## 2023-03-26 NOTE — Patient Instructions (Addendum)
Wound Care Instructions  Cleanse wound gently with soap and water once a day then pat dry with clean gauze. Apply a thin coat of Petrolatum (petroleum jelly, "Vaseline") over the wound (unless you have an allergy to this). We recommend that you use a new, sterile tube of Vaseline. Do not pick or remove scabs. Do not remove the yellow or white "healing tissue" from the base of the wound.  Cover the wound with fresh, clean, nonstick gauze and secure with paper tape. You may use Band-Aids in place of gauze and tape if the wound is small enough, but would recommend trimming much of the tape off as there is often too much. Sometimes Band-Aids can irritate the skin.  You should call the office for your biopsy report after 1 week if you have not already been contacted.  If you experience any problems, such as abnormal amounts of bleeding, swelling, significant bruising, significant pain, or evidence of infection, please call the office immediately.  FOR ADULT SURGERY PATIENTS: If you need something for pain relief you may take 1 extra strength Tylenol (acetaminophen) AND 2 Ibuprofen (200mg each) together every 4 hours as needed for pain. (do not take these if you are allergic to them or if you have a reason you should not take them.) Typically, you may only need pain medication for 1 to 3 days.      Cryotherapy Aftercare  Wash gently with soap and water everyday.   Apply Vaseline and Band-Aid daily until healed.      Due to recent changes in healthcare laws, you may see results of your pathology and/or laboratory studies on MyChart before the doctors have had a chance to review them. We understand that in some cases there may be results that are confusing or concerning to you. Please understand that not all results are received at the same time and often the doctors may need to interpret multiple results in order to provide you with the best plan of care or course of treatment. Therefore, we ask that  you please give us 2 business days to thoroughly review all your results before contacting the office for clarification. Should we see a critical lab result, you will be contacted sooner.   If You Need Anything After Your Visit  If you have any questions or concerns for your doctor, please call our main line at [phone] and press option 4 to reach your doctor's medical assistant. If no one answers, please leave a voicemail as directed and we will return your call as soon as possible. Messages left after 4 pm will be answered the following business day.   You may also send us a message via MyChart. We typically respond to MyChart messages within 1-2 business days.  For prescription refills, please ask your pharmacy to contact our office. Our fax number is [phone].  If you have an urgent issue when the clinic is closed that cannot wait until the next business day, you can page your doctor at the number below.    Please note that while we do our best to be available for urgent issues outside of office hours, we are not available 24/7.   If you have an urgent issue and are unable to reach us, you may choose to seek medical care at your doctor's office, retail clinic, urgent care center, or emergency room.  If you have a medical emergency, please immediately call 911 or go to the emergency department.  Pager Numbers  - Dr.   Kowalski: [phone]  - Dr. Moye: [phone]  - Dr. Stewart: [phone]  In the event of inclement weather, please call our main line at [phone] for an update on the status of any delays or closures.  Dermatology Medication Tips: Please keep the boxes that topical medications come in in order to help keep track of the instructions about where and how to use these. Pharmacies typically print the medication instructions only on the boxes and not directly on the medication tubes.   If your medication is too expensive, please contact our office at  [phone] option 4 or send us a message through MyChart.   We are unable to tell what your co-pay for medications will be in advance as this is different depending on your insurance coverage. However, we may be able to find a substitute medication at lower cost or fill out paperwork to get insurance to cover a needed medication.   If a prior authorization is required to get your medication covered by your insurance company, please allow us 1-2 business days to complete this process.  Drug prices often vary depending on where the prescription is filled and some pharmacies may offer cheaper prices.  The website www.goodrx.com contains coupons for medications through different pharmacies. The prices here do not account for what the cost may be with help from insurance (it may be cheaper with your insurance), but the website can give you the price if you did not use any insurance.  - You can print the associated coupon and take it with your prescription to the pharmacy.  - You may also stop by our office during regular business hours and pick up a GoodRx coupon card.  - If you need your prescription sent electronically to a different pharmacy, notify our office through [facility] MyChart or by phone at [phone] option 4.     Si Usted Necesita Algo Despus de Su Visita  Tambin puede enviarnos un mensaje a travs de MyChart. Por lo general respondemos a los mensajes de MyChart en el transcurso de 1 a 2 das hbiles.  Para renovar recetas, por favor pida a su farmacia que se ponga en contacto con nuestra oficina. Nuestro nmero de fax es el [phone].  Si tiene un asunto urgente cuando la clnica est cerrada y que no puede esperar hasta el siguiente da hbil, puede llamar/localizar a su doctor(a) al nmero que aparece a continuacin.   Por favor, tenga en cuenta que aunque hacemos todo lo posible para estar disponibles para asuntos urgentes fuera del horario de oficina, no estamos  disponibles las 24 horas del da, los 7 das de la semana.   Si tiene un problema urgente y no puede comunicarse con nosotros, puede optar por buscar atencin mdica  en el consultorio de su doctor(a), en una clnica privada, en un centro de atencin urgente o en una sala de emergencias.  Si tiene una emergencia mdica, por favor llame inmediatamente al 911 o vaya a la sala de emergencias.  Nmeros de bper  - Dr. Kowalski: [phone]  - Dra. Moye: [phone]  - Dra. Stewart: [phone]  En caso de inclemencias del tiempo, por favor llame a nuestra lnea principal al [phone] para una actualizacin sobre el estado de cualquier retraso o cierre.  Consejos para la medicacin en dermatologa: Por favor, guarde las cajas en las que vienen los medicamentos de uso tpico para ayudarle a seguir las instrucciones sobre dnde y cmo usarlos. Las farmacias generalmente imprimen las instrucciones del medicamento slo   en las cajas y no directamente en los tubos del medicamento.   Si su medicamento es muy caro, por favor, pngase en contacto con nuestra oficina llamando al [phone] y presione la opcin 4 o envenos un mensaje a travs de MyChart.   No podemos decirle cul ser su copago por los medicamentos por adelantado ya que esto es diferente dependiendo de la cobertura de su seguro. Sin embargo, es posible que podamos encontrar un medicamento sustituto a menor costo o llenar un formulario para que el seguro cubra el medicamento que se considera necesario.   Si se requiere una autorizacin previa para que su compaa de seguros cubra su medicamento, por favor permtanos de 1 a 2 das hbiles para completar este proceso.  Los precios de los medicamentos varan con frecuencia dependiendo del lugar de dnde se surte la receta y alguna farmacias pueden ofrecer precios ms baratos.  El sitio web www.goodrx.com tiene cupones para medicamentos de diferentes farmacias. Los precios aqu no  tienen en cuenta lo que podra costar con la ayuda del seguro (puede ser ms barato con su seguro), pero el sitio web puede darle el precio si no utiliz ningn seguro.  - Puede imprimir el cupn correspondiente y llevarlo con su receta a la farmacia.  - Tambin puede pasar por nuestra oficina durante el horario de atencin regular y recoger una tarjeta de cupones de GoodRx.  - Si necesita que su receta se enve electrnicamente a una farmacia diferente, informe a nuestra oficina a travs de MyChart de [facility] o por telfono llamando al [phone] y presione la opcin 4.  

## 2023-03-27 ENCOUNTER — Telehealth: Payer: Self-pay

## 2023-03-27 NOTE — Telephone Encounter (Signed)
Advised pt of bx result/sh ?

## 2023-03-27 NOTE — Telephone Encounter (Signed)
-----   Message from Deirdre Evener, MD sent at 03/27/2023  5:18 PM EDT ----- Diagnosis Skin , right cheek WELL DIFFERENTIATED SQUAMOUS CELL CARCINOMA WITH SUPERFICIAL INFILTRATION  Cancer = SCC Already treated Recheck next visit

## 2023-04-09 DIAGNOSIS — Z961 Presence of intraocular lens: Secondary | ICD-10-CM | POA: Diagnosis not present

## 2023-04-09 DIAGNOSIS — H52223 Regular astigmatism, bilateral: Secondary | ICD-10-CM | POA: Diagnosis not present

## 2023-04-09 DIAGNOSIS — H524 Presbyopia: Secondary | ICD-10-CM | POA: Diagnosis not present

## 2023-04-09 DIAGNOSIS — H26493 Other secondary cataract, bilateral: Secondary | ICD-10-CM | POA: Diagnosis not present

## 2023-04-16 DIAGNOSIS — H26493 Other secondary cataract, bilateral: Secondary | ICD-10-CM | POA: Diagnosis not present

## 2023-04-24 DIAGNOSIS — G5603 Carpal tunnel syndrome, bilateral upper limbs: Secondary | ICD-10-CM | POA: Diagnosis not present

## 2023-05-22 DIAGNOSIS — I482 Chronic atrial fibrillation, unspecified: Secondary | ICD-10-CM | POA: Diagnosis not present

## 2023-05-22 DIAGNOSIS — G4733 Obstructive sleep apnea (adult) (pediatric): Secondary | ICD-10-CM | POA: Diagnosis not present

## 2023-05-22 DIAGNOSIS — I10 Essential (primary) hypertension: Secondary | ICD-10-CM | POA: Diagnosis not present

## 2023-05-22 DIAGNOSIS — E782 Mixed hyperlipidemia: Secondary | ICD-10-CM | POA: Diagnosis not present

## 2023-06-18 DIAGNOSIS — E538 Deficiency of other specified B group vitamins: Secondary | ICD-10-CM | POA: Diagnosis not present

## 2023-06-18 DIAGNOSIS — R7303 Prediabetes: Secondary | ICD-10-CM | POA: Diagnosis not present

## 2023-06-18 DIAGNOSIS — E782 Mixed hyperlipidemia: Secondary | ICD-10-CM | POA: Diagnosis not present

## 2023-06-18 DIAGNOSIS — I10 Essential (primary) hypertension: Secondary | ICD-10-CM | POA: Diagnosis not present

## 2023-06-25 DIAGNOSIS — I10 Essential (primary) hypertension: Secondary | ICD-10-CM | POA: Diagnosis not present

## 2023-06-25 DIAGNOSIS — I480 Paroxysmal atrial fibrillation: Secondary | ICD-10-CM | POA: Diagnosis not present

## 2023-06-25 DIAGNOSIS — Z66 Do not resuscitate: Secondary | ICD-10-CM | POA: Diagnosis not present

## 2023-06-25 DIAGNOSIS — Z1331 Encounter for screening for depression: Secondary | ICD-10-CM | POA: Diagnosis not present

## 2023-06-25 DIAGNOSIS — I6203 Nontraumatic chronic subdural hemorrhage: Secondary | ICD-10-CM | POA: Diagnosis not present

## 2023-06-25 DIAGNOSIS — R600 Localized edema: Secondary | ICD-10-CM | POA: Diagnosis not present

## 2023-06-25 DIAGNOSIS — Z0000 Encounter for general adult medical examination without abnormal findings: Secondary | ICD-10-CM | POA: Diagnosis not present

## 2023-06-25 DIAGNOSIS — Z23 Encounter for immunization: Secondary | ICD-10-CM | POA: Diagnosis not present

## 2023-06-25 DIAGNOSIS — E785 Hyperlipidemia, unspecified: Secondary | ICD-10-CM | POA: Diagnosis not present

## 2023-07-03 ENCOUNTER — Ambulatory Visit: Payer: Medicare HMO | Admitting: Dermatology

## 2023-07-08 DIAGNOSIS — M170 Bilateral primary osteoarthritis of knee: Secondary | ICD-10-CM | POA: Diagnosis not present

## 2023-08-19 DIAGNOSIS — R197 Diarrhea, unspecified: Secondary | ICD-10-CM | POA: Diagnosis not present

## 2023-08-19 DIAGNOSIS — I517 Cardiomegaly: Secondary | ICD-10-CM | POA: Diagnosis not present

## 2023-08-19 DIAGNOSIS — I480 Paroxysmal atrial fibrillation: Secondary | ICD-10-CM | POA: Diagnosis not present

## 2023-08-19 DIAGNOSIS — I6203 Nontraumatic chronic subdural hemorrhage: Secondary | ICD-10-CM | POA: Diagnosis not present

## 2023-08-20 DIAGNOSIS — R197 Diarrhea, unspecified: Secondary | ICD-10-CM | POA: Diagnosis not present

## 2023-08-20 DIAGNOSIS — K5989 Other specified functional intestinal disorders: Secondary | ICD-10-CM | POA: Diagnosis not present

## 2023-08-30 DIAGNOSIS — M25561 Pain in right knee: Secondary | ICD-10-CM | POA: Diagnosis not present

## 2023-08-30 DIAGNOSIS — M1711 Unilateral primary osteoarthritis, right knee: Secondary | ICD-10-CM | POA: Diagnosis not present

## 2023-08-30 DIAGNOSIS — M25461 Effusion, right knee: Secondary | ICD-10-CM | POA: Diagnosis not present

## 2023-08-30 DIAGNOSIS — I7090 Unspecified atherosclerosis: Secondary | ICD-10-CM | POA: Diagnosis not present

## 2023-09-18 ENCOUNTER — Ambulatory Visit: Payer: Medicare HMO | Admitting: Dermatology

## 2023-09-18 DIAGNOSIS — L820 Inflamed seborrheic keratosis: Secondary | ICD-10-CM | POA: Diagnosis not present

## 2023-09-18 DIAGNOSIS — L578 Other skin changes due to chronic exposure to nonionizing radiation: Secondary | ICD-10-CM

## 2023-09-18 DIAGNOSIS — D229 Melanocytic nevi, unspecified: Secondary | ICD-10-CM

## 2023-09-18 DIAGNOSIS — W908XXA Exposure to other nonionizing radiation, initial encounter: Secondary | ICD-10-CM | POA: Diagnosis not present

## 2023-09-18 DIAGNOSIS — L814 Other melanin hyperpigmentation: Secondary | ICD-10-CM

## 2023-09-18 DIAGNOSIS — Z1283 Encounter for screening for malignant neoplasm of skin: Secondary | ICD-10-CM | POA: Diagnosis not present

## 2023-09-18 DIAGNOSIS — L821 Other seborrheic keratosis: Secondary | ICD-10-CM

## 2023-09-18 DIAGNOSIS — Z8589 Personal history of malignant neoplasm of other organs and systems: Secondary | ICD-10-CM

## 2023-09-18 DIAGNOSIS — Z85828 Personal history of other malignant neoplasm of skin: Secondary | ICD-10-CM

## 2023-09-18 DIAGNOSIS — L570 Actinic keratosis: Secondary | ICD-10-CM

## 2023-09-18 DIAGNOSIS — D1801 Hemangioma of skin and subcutaneous tissue: Secondary | ICD-10-CM

## 2023-09-18 NOTE — Patient Instructions (Signed)

## 2023-09-18 NOTE — Progress Notes (Signed)
Follow-Up Visit   Subjective  Preston Hill is a 89 y.o. male who presents for the following: Skin Cancer Screening and Upper Body Skin Exam  The patient presents for Upper Body Skin Exam (UBSE) for skin cancer screening and mole check. The patient has spots, moles and lesions to be evaluated, some may be new or changing and the patient may have concern these could be cancer.    The following portions of the chart were reviewed this encounter and updated as appropriate: medications, allergies, medical history  Review of Systems:  No other skin or systemic complaints except as noted in HPI or Assessment and Plan.  Objective  Well appearing patient in no apparent distress; mood and affect are within normal limits.  All skin waist up examined. Relevant physical exam findings are noted in the Assessment and Plan.  R lat lower lip vermillion border x 1, face and ears x 12, hands and arms x 5 (18) Erythematous thin papules/macules with gritty scale.  Back x 6, arms and chest x 11 (17) Stuck on waxy paps with erythema  Assessment & Plan   ACTINIC KERATOSIS (18) R lat lower lip vermillion border x 1, face and ears x 12, hands and arms x 5 (18) Actinic keratoses are precancerous spots that appear secondary to cumulative UV radiation exposure/sun exposure over time. They are chronic with expected duration over 1 year. A portion of actinic keratoses will progress to squamous cell carcinoma of the skin. It is not possible to reliably predict which spots will progress to skin cancer and so treatment is recommended to prevent development of skin cancer.  Recommend daily broad spectrum sunscreen SPF 30+ to sun-exposed areas, reapply every 2 hours as needed.  Recommend staying in the shade or wearing long sleeves, sun glasses (UVA+UVB protection) and wide brim hats (4-inch brim around the entire circumference of the hat). Call for new or changing lesions. Destruction of lesion - R lat lower lip  vermillion border x 1, face and ears x 12, hands and arms x 5 (18) Complexity: simple   Destruction method: cryotherapy   Informed consent: discussed and consent obtained   Timeout:  patient name, date of birth, surgical site, and procedure verified Lesion destroyed using liquid nitrogen: Yes   Region frozen until ice ball extended beyond lesion: Yes   Outcome: patient tolerated procedure well with no complications   Post-procedure details: wound care instructions given   INFLAMED SEBORRHEIC KERATOSIS (17) Back x 6, arms and chest x 11 (17) Symptomatic, irritating, patient would like treated.  Destruction of lesion - Back x 6, arms and chest x 11 (17) Complexity: simple   Destruction method: cryotherapy   Informed consent: discussed and consent obtained   Timeout:  patient name, date of birth, surgical site, and procedure verified Lesion destroyed using liquid nitrogen: Yes   Region frozen until ice ball extended beyond lesion: Yes   Outcome: patient tolerated procedure well with no complications   Post-procedure details: wound care instructions given   Skin cancer screening performed today.  Actinic Damage - Chronic condition, secondary to cumulative UV/sun exposure - diffuse scaly erythematous macules with underlying dyspigmentation - Recommend daily broad spectrum sunscreen SPF 30+ to sun-exposed areas, reapply every 2 hours as needed.  - Staying in the shade or wearing long sleeves, sun glasses (UVA+UVB protection) and wide brim hats (4-inch brim around the entire circumference of the hat) are also recommended for sun protection.  - Call for new or changing  lesions.  Lentigines, Seborrheic Keratoses, Hemangiomas - Benign normal skin lesions - Benign-appearing - Call for any changes  Melanocytic Nevi - Tan-brown and/or pink-flesh-colored symmetric macules and papules - Benign appearing on exam today - Observation - Call clinic for new or changing moles - Recommend daily use  of broad spectrum spf 30+ sunscreen to sun-exposed areas.   HISTORY OF BASAL CELL CARCINOMA OF THE SKIN - No evidence of recurrence today - Recommend regular full body skin exams - Recommend daily broad spectrum sunscreen SPF 30+ to sun-exposed areas, reapply every 2 hours as needed.  - Call if any new or changing lesions are noted between office visits  HISTORY OF SQUAMOUS CELL CARCINOMA OF THE SKIN - No evidence of recurrence today - No lymphadenopathy - Recommend regular full body skin exams - Recommend daily broad spectrum sunscreen SPF 30+ to sun-exposed areas, reapply every 2 hours as needed.  - Call if any new or changing lesions are noted between office visits  Return in about 6 months (around 03/18/2024) for UBSE.  Maylene Roes, CMA, am acting as scribe for Armida Sans, MD .  Documentation: I have reviewed the above documentation for accuracy and completeness, and I agree with the above.  Armida Sans, MD

## 2023-09-29 ENCOUNTER — Encounter: Payer: Self-pay | Admitting: Dermatology

## 2023-10-08 ENCOUNTER — Other Ambulatory Visit
Admission: RE | Admit: 2023-10-08 | Discharge: 2023-10-08 | Disposition: A | Discharge status: Home / Self Care | Payer: Medicare HMO | Attending: Student | Admitting: Student

## 2023-10-08 DIAGNOSIS — R918 Other nonspecific abnormal finding of lung field: Secondary | ICD-10-CM | POA: Diagnosis not present

## 2023-10-08 DIAGNOSIS — J22 Unspecified acute lower respiratory infection: Secondary | ICD-10-CM | POA: Diagnosis not present

## 2023-10-08 DIAGNOSIS — J984 Other disorders of lung: Secondary | ICD-10-CM | POA: Diagnosis not present

## 2023-10-08 DIAGNOSIS — I517 Cardiomegaly: Secondary | ICD-10-CM | POA: Diagnosis not present

## 2023-10-08 DIAGNOSIS — R051 Acute cough: Secondary | ICD-10-CM | POA: Diagnosis not present

## 2023-10-08 DIAGNOSIS — R7989 Other specified abnormal findings of blood chemistry: Secondary | ICD-10-CM | POA: Diagnosis not present

## 2023-10-08 DIAGNOSIS — R600 Localized edema: Secondary | ICD-10-CM | POA: Insufficient documentation

## 2023-10-08 DIAGNOSIS — R635 Abnormal weight gain: Secondary | ICD-10-CM | POA: Diagnosis not present

## 2023-10-08 LAB — BRAIN NATRIURETIC PEPTIDE: B Natriuretic Peptide: 607.2 pg/mL — ABNORMAL HIGH (ref 0.0–100.0)

## 2023-10-15 DIAGNOSIS — I10 Essential (primary) hypertension: Secondary | ICD-10-CM | POA: Diagnosis not present

## 2023-10-15 DIAGNOSIS — R7303 Prediabetes: Secondary | ICD-10-CM | POA: Diagnosis not present

## 2023-10-15 DIAGNOSIS — R7989 Other specified abnormal findings of blood chemistry: Secondary | ICD-10-CM | POA: Diagnosis not present

## 2023-10-15 DIAGNOSIS — R600 Localized edema: Secondary | ICD-10-CM | POA: Diagnosis not present

## 2023-10-15 DIAGNOSIS — G4730 Sleep apnea, unspecified: Secondary | ICD-10-CM | POA: Diagnosis not present

## 2023-10-16 DIAGNOSIS — R600 Localized edema: Secondary | ICD-10-CM | POA: Diagnosis not present

## 2023-11-07 DIAGNOSIS — G5603 Carpal tunnel syndrome, bilateral upper limbs: Secondary | ICD-10-CM | POA: Diagnosis not present

## 2023-11-07 DIAGNOSIS — R251 Tremor, unspecified: Secondary | ICD-10-CM | POA: Diagnosis not present

## 2023-11-07 DIAGNOSIS — M47812 Spondylosis without myelopathy or radiculopathy, cervical region: Secondary | ICD-10-CM | POA: Diagnosis not present

## 2023-11-19 DIAGNOSIS — G5603 Carpal tunnel syndrome, bilateral upper limbs: Secondary | ICD-10-CM | POA: Diagnosis not present

## 2023-11-25 DIAGNOSIS — G5603 Carpal tunnel syndrome, bilateral upper limbs: Secondary | ICD-10-CM | POA: Diagnosis not present

## 2023-12-19 DIAGNOSIS — I10 Essential (primary) hypertension: Secondary | ICD-10-CM | POA: Diagnosis not present

## 2023-12-19 DIAGNOSIS — R600 Localized edema: Secondary | ICD-10-CM | POA: Diagnosis not present

## 2023-12-19 DIAGNOSIS — E782 Mixed hyperlipidemia: Secondary | ICD-10-CM | POA: Diagnosis not present

## 2023-12-19 DIAGNOSIS — R7303 Prediabetes: Secondary | ICD-10-CM | POA: Diagnosis not present

## 2023-12-19 DIAGNOSIS — I6203 Nontraumatic chronic subdural hemorrhage: Secondary | ICD-10-CM | POA: Diagnosis not present

## 2023-12-19 DIAGNOSIS — E538 Deficiency of other specified B group vitamins: Secondary | ICD-10-CM | POA: Diagnosis not present

## 2023-12-26 DIAGNOSIS — G4733 Obstructive sleep apnea (adult) (pediatric): Secondary | ICD-10-CM | POA: Diagnosis not present

## 2023-12-26 DIAGNOSIS — R7303 Prediabetes: Secondary | ICD-10-CM | POA: Diagnosis not present

## 2023-12-26 DIAGNOSIS — E782 Mixed hyperlipidemia: Secondary | ICD-10-CM | POA: Diagnosis not present

## 2023-12-26 DIAGNOSIS — M47812 Spondylosis without myelopathy or radiculopathy, cervical region: Secondary | ICD-10-CM | POA: Diagnosis not present

## 2023-12-26 DIAGNOSIS — R600 Localized edema: Secondary | ICD-10-CM | POA: Diagnosis not present

## 2023-12-26 DIAGNOSIS — I10 Essential (primary) hypertension: Secondary | ICD-10-CM | POA: Diagnosis not present

## 2023-12-26 DIAGNOSIS — I480 Paroxysmal atrial fibrillation: Secondary | ICD-10-CM | POA: Diagnosis not present

## 2023-12-26 DIAGNOSIS — I6203 Nontraumatic chronic subdural hemorrhage: Secondary | ICD-10-CM | POA: Diagnosis not present

## 2023-12-26 DIAGNOSIS — E538 Deficiency of other specified B group vitamins: Secondary | ICD-10-CM | POA: Diagnosis not present

## 2024-01-06 DIAGNOSIS — E876 Hypokalemia: Secondary | ICD-10-CM | POA: Diagnosis not present

## 2024-02-05 DIAGNOSIS — M170 Bilateral primary osteoarthritis of knee: Secondary | ICD-10-CM | POA: Diagnosis not present

## 2024-02-19 DIAGNOSIS — I10 Essential (primary) hypertension: Secondary | ICD-10-CM | POA: Diagnosis not present

## 2024-02-19 DIAGNOSIS — E782 Mixed hyperlipidemia: Secondary | ICD-10-CM | POA: Diagnosis not present

## 2024-02-19 DIAGNOSIS — I6203 Nontraumatic chronic subdural hemorrhage: Secondary | ICD-10-CM | POA: Diagnosis not present

## 2024-02-19 DIAGNOSIS — I482 Chronic atrial fibrillation, unspecified: Secondary | ICD-10-CM | POA: Diagnosis not present

## 2024-02-19 DIAGNOSIS — R600 Localized edema: Secondary | ICD-10-CM | POA: Diagnosis not present

## 2024-02-19 DIAGNOSIS — G4733 Obstructive sleep apnea (adult) (pediatric): Secondary | ICD-10-CM | POA: Diagnosis not present

## 2024-03-18 ENCOUNTER — Encounter: Payer: Self-pay | Admitting: Dermatology

## 2024-03-18 ENCOUNTER — Ambulatory Visit: Payer: Medicare HMO | Admitting: Dermatology

## 2024-03-18 DIAGNOSIS — L820 Inflamed seborrheic keratosis: Secondary | ICD-10-CM | POA: Diagnosis not present

## 2024-03-18 DIAGNOSIS — D229 Melanocytic nevi, unspecified: Secondary | ICD-10-CM

## 2024-03-18 DIAGNOSIS — L814 Other melanin hyperpigmentation: Secondary | ICD-10-CM

## 2024-03-18 DIAGNOSIS — L570 Actinic keratosis: Secondary | ICD-10-CM

## 2024-03-18 DIAGNOSIS — D1801 Hemangioma of skin and subcutaneous tissue: Secondary | ICD-10-CM | POA: Diagnosis not present

## 2024-03-18 DIAGNOSIS — Z85828 Personal history of other malignant neoplasm of skin: Secondary | ICD-10-CM

## 2024-03-18 DIAGNOSIS — Z1283 Encounter for screening for malignant neoplasm of skin: Secondary | ICD-10-CM | POA: Diagnosis not present

## 2024-03-18 DIAGNOSIS — D692 Other nonthrombocytopenic purpura: Secondary | ICD-10-CM

## 2024-03-18 DIAGNOSIS — D492 Neoplasm of unspecified behavior of bone, soft tissue, and skin: Secondary | ICD-10-CM | POA: Diagnosis not present

## 2024-03-18 DIAGNOSIS — L821 Other seborrheic keratosis: Secondary | ICD-10-CM | POA: Diagnosis not present

## 2024-03-18 DIAGNOSIS — L578 Other skin changes due to chronic exposure to nonionizing radiation: Secondary | ICD-10-CM | POA: Diagnosis not present

## 2024-03-18 DIAGNOSIS — Z8589 Personal history of malignant neoplasm of other organs and systems: Secondary | ICD-10-CM

## 2024-03-18 DIAGNOSIS — D099 Carcinoma in situ, unspecified: Secondary | ICD-10-CM

## 2024-03-18 DIAGNOSIS — W908XXA Exposure to other nonionizing radiation, initial encounter: Secondary | ICD-10-CM

## 2024-03-18 DIAGNOSIS — D0462 Carcinoma in situ of skin of left upper limb, including shoulder: Secondary | ICD-10-CM | POA: Diagnosis not present

## 2024-03-18 HISTORY — DX: Carcinoma in situ, unspecified: D09.9

## 2024-03-18 NOTE — Patient Instructions (Addendum)
Cryotherapy Aftercare  Wash gently with soap and water everyday.   Apply Vaseline and Band-Aid daily until healed.   Wound Care Instructions  Cleanse wound gently with soap and water once a day then pat dry with clean gauze. Apply a thin coat of Petrolatum (petroleum jelly, "Vaseline") over the wound (unless you have an allergy to this). We recommend that you use a new, sterile tube of Vaseline. Do not pick or remove scabs. Do not remove the yellow or white "healing tissue" from the base of the wound.  Cover the wound with fresh, clean, nonstick gauze and secure with paper tape. You may use Band-Aids in place of gauze and tape if the wound is small enough, but would recommend trimming much of the tape off as there is often too much. Sometimes Band-Aids can irritate the skin.  You should call the office for your biopsy report after 1 week if you have not already been contacted.  If you experience any problems, such as abnormal amounts of bleeding, swelling, significant bruising, significant pain, or evidence of infection, please call the office immediately.  FOR ADULT SURGERY PATIENTS: If you need something for pain relief you may take 1 extra strength Tylenol (acetaminophen) AND 2 Ibuprofen (200mg  each) together every 4 hours as needed for pain. (do not take these if you are allergic to them or if you have a reason you should not take them.) Typically, you may only need pain medication for 1 to 3 days.      Recommend daily broad spectrum sunscreen SPF 30+ to sun-exposed areas, reapply every 2 hours as needed. Call for new or changing lesions.  Staying in the shade or wearing long sleeves, sun glasses (UVA+UVB protection) and wide brim hats (4-inch brim around the entire circumference of the hat) are also recommended for sun protection.    Melanoma ABCDEs  Melanoma is the most dangerous type of skin cancer, and is the leading cause of death from skin disease.  You are more likely to develop  melanoma if you: Have light-colored skin, light-colored eyes, or red or blond hair Spend a lot of time in the sun Tan regularly, either outdoors or in a tanning bed Have had blistering sunburns, especially during childhood Have a close family member who has had a melanoma Have atypical moles or large birthmarks  Early detection of melanoma is key since treatment is typically straightforward and cure rates are extremely high if we catch it early.   The first sign of melanoma is often a change in a mole or a new dark spot.  The ABCDE system is a way of remembering the signs of melanoma.  A for asymmetry:  The two halves do not match. B for border:  The edges of the growth are irregular. C for color:  A mixture of colors are present instead of an even brown color. D for diameter:  Melanomas are usually (but not always) greater than 6mm - the size of a pencil eraser. E for evolution:  The spot keeps changing in size, shape, and color.  Please check your skin once per month between visits. You can use a small mirror in front and a large mirror behind you to keep an eye on the back side or your body.   If you see any new or changing lesions before your next follow-up, please call to schedule a visit.  Please continue daily skin protection including broad spectrum sunscreen SPF 30+ to sun-exposed areas, reapplying every 2 hours  as needed when you're outdoors.   Staying in the shade or wearing long sleeves, sun glasses (UVA+UVB protection) and wide brim hats (4-inch brim around the entire circumference of the hat) are also recommended for sun protection.    Due to recent changes in healthcare laws, you may see results of your pathology and/or laboratory studies on MyChart before the doctors have had a chance to review them. We understand that in some cases there may be results that are confusing or concerning to you. Please understand that not all results are received at the same time and often the  doctors may need to interpret multiple results in order to provide you with the best plan of care or course of treatment. Therefore, we ask that you please give [place] 2 business days to thoroughly review all your results before contacting the office for clarification. Should we see a critical lab result, you will be contacted sooner.   If You Need Anything After Your Visit  If you have any questions or concerns for your doctor, please call our main line at [phone] and press option 4 to reach your doctor's medical assistant. If no one answers, please leave a voicemail as directed and we will return your call as soon as possible. Messages left after 4 pm will be answered the following business day.   You may also send [place] a message via MyChart. We typically respond to MyChart messages within 1-2 business days.  For prescription refills, please ask your pharmacy to contact our office. Our fax number is [phone].  If you have an urgent issue when the clinic is closed that cannot wait until the next business day, you can page your doctor at the number below.    Please note that while we do our best to be available for urgent issues outside of office hours, we are not available 24/7.   If you have an urgent issue and are unable to reach [place], you may choose to seek medical care at your doctor's office, retail clinic, urgent care center, or emergency room.  If you have a medical emergency, please immediately call 911 or go to the emergency department.  Pager Numbers  - Dr. Gwen Pounds: [phone]  - Dr. Roseanne Reno: [phone]  - Dr. Katrinka Blazing: [phone]   In the event of inclement weather, please call our main line at [phone] for an update on the status of any delays or closures.  Dermatology Medication Tips: Please keep the boxes that topical medications come in in order to help keep track of the instructions about where and how to use these. Pharmacies typically print the medication  instructions only on the boxes and not directly on the medication tubes.   If your medication is too expensive, please contact our office at [phone] option 4 or send [place] a message through MyChart.   We are unable to tell what your co-pay for medications will be in advance as this is different depending on your insurance coverage. However, we may be able to find a substitute medication at lower cost or fill out paperwork to get insurance to cover a needed medication.   If a prior authorization is required to get your medication covered by your insurance company, please allow [place] 1-2 business days to complete this process.  Drug prices often vary depending on where the prescription is filled and some pharmacies may offer cheaper prices.  The website www.goodrx.com contains coupons for medications through different pharmacies. The prices here do not account for  what the cost may be with help from insurance (it may be cheaper with your insurance), but the website can give you the price if you did not use any insurance.  - You can print the associated coupon and take it with your prescription to the pharmacy.  - You may also stop by our office during regular business hours and pick up a GoodRx coupon card.  - If you need your prescription sent electronically to a different pharmacy, notify our office through [facility] or by phone at [phone] option 4.     Si Usted Necesita Algo Despus de Su Visita  Tambin puede enviarnos un mensaje a travs de Clinical cytogeneticist. Por lo general respondemos a los mensajes de MyChart en el transcurso de 1 a 2 das hbiles.  Para renovar recetas, por favor pida a su farmacia que se ponga en contacto con nuestra oficina. Annie Sable de fax es [place] [phone].  Si tiene un asunto urgente cuando la clnica est cerrada y que no puede esperar hasta el siguiente da hbil, puede llamar/localizar a su doctor(a) al nmero que aparece a continuacin.   Por  favor, tenga en cuenta que aunque hacemos todo lo posible para estar disponibles para asuntos urgentes fuera del horario de [place], no estamos disponibles las 24 horas del da, los 7 [address]  [address] de la [place].   Si tiene un problema urgente y no puede comunicarse con nosotros, puede optar por buscar atencin mdica  en el consultorio de su doctor(a), en una clnica privada, en un centro de atencin urgente o en una sala de emergencias.  Si tiene Engineer, drilling, por favor llame inmediatamente al 911 o vaya a la sala de emergencias.  Nmeros de bper  - Dr. Gwen Pounds: [phone]  - Dra. Roseanne Reno: [phone]  - Dr. Katrinka Blazing: [phone]   En caso de inclemencias del tiempo, por favor llame a Lacy Duverney principal al [phone] para una actualizacin sobre el [place] de cualquier retraso o cierre.  Consejos para la medicacin en dermatologa: Por favor, guarde las cajas en las que vienen los medicamentos de uso tpico para ayudarle a seguir las instrucciones sobre dnde y cmo usarlos. Las farmacias generalmente imprimen las instrucciones del medicamento slo en las cajas y no directamente en los tubos del [place].   Si su medicamento es muy caro, por favor, pngase en contacto con Rolm Gala llamando al [phone] y presione la opcin 4 o envenos un mensaje a travs de Clinical cytogeneticist.   No podemos decirle cul ser su copago por los medicamentos por adelantado ya que esto es diferente dependiendo de la cobertura de su seguro. Sin embargo, es posible que podamos encontrar un medicamento sustituto a Audiological scientist un formulario para que el seguro cubra el medicamento que se considera necesario.   Si se requiere una autorizacin previa para que su compaa de seguros [place] su medicamento, por favor permtanos de 1 a 2 das hbiles para completar [address]  Los precios de los medicamentos varan con frecuencia dependiendo del Environmental consultant de dnde se surte la receta y alguna farmacias  pueden ofrecer precios ms baratos.  El sitio web www.goodrx.com tiene cupones para medicamentos de Health and safety inspector. Los precios aqu no tienen en cuenta lo que podra costar con la ayuda del seguro (puede ser ms barato con su seguro), pero el sitio web puede darle el precio si no utiliz Tourist information centre manager.  - Puede imprimir el cupn correspondiente y llevarlo con su receta a la farmacia.  Laroy Apple  puede pasar por nuestra oficina durante el horario de atencin regular y Education officer, museum una tarjeta de cupones de GoodRx.  - Si necesita que su receta se enve electrnicamente a una farmacia diferente, informe a nuestra oficina a travs de MyChart de [place] o por telfono llamando al [phone] y presione la opcin 4.

## 2024-03-18 NOTE — Progress Notes (Signed)
 Follow-Up Visit   Subjective  Preston Hill is a 89 y.o. male who presents for the following: Skin Cancer Screening and Upper Body Skin Exam. Hx of BCC, Hx of SCC, Hx AKs.   Right hand, left shoulder, left ear. Areas of concern. Thinks lesion on ear has been treated before with LN2.  The patient presents for Upper Body Skin Exam (UBSE) for skin cancer screening and mole check. The patient has spots, moles and lesions to be evaluated, some may be new or changing and the patient may have concern these could be cancer.  The following portions of the chart were reviewed this encounter and updated as appropriate: medications, allergies, medical history  Review of Systems:  No other skin or systemic complaints except as noted in HPI or Assessment and Plan.  Objective  Well appearing patient in no apparent distress; mood and affect are within normal limits.  All skin waist up examined. Relevant physical exam findings are noted in the Assessment and Plan.  Right Shoulder - Anterior x1, face and arms x6 (7) Erythematous keratotic or waxy stuck-on papule or plaque. arms, hands, face, ears x32 (32) Erythematous thin papules/macules with gritty scale.  Left dorsal hand near thumb 0.6 cm erythematous papule with central keratotic horn  Assessment & Plan   INFLAMED SEBORRHEIC KERATOSIS (7) Right Shoulder - Anterior x1, face and arms x6 (7) Symptomatic, irritating, patient would like treated. Destruction of lesion - Right Shoulder - Anterior x1, face and arms x6 (7) Complexity: simple   Destruction method: cryotherapy   Informed consent: discussed and consent obtained   Timeout:  patient name, date of birth, surgical site, and procedure verified Lesion destroyed using liquid nitrogen: Yes   Region frozen until ice ball extended beyond lesion: Yes   Outcome: patient tolerated procedure well with no complications   Post-procedure details: wound care instructions given   Additional details:   Prior to procedure, discussed risks of blister formation, small wound, skin dyspigmentation, or rare scar following cryotherapy. Recommend Vaseline ointment to treated areas while healing.  AK (ACTINIC KERATOSIS) (32) arms, hands, face, ears x32 (32) Actinic keratoses are precancerous spots that appear secondary to cumulative UV radiation exposure/sun exposure over time. They are chronic with expected duration over 1 year. A portion of actinic keratoses will progress to squamous cell carcinoma of the skin. It is not possible to reliably predict which spots will progress to skin cancer and so treatment is recommended to prevent development of skin cancer.  Recommend daily broad spectrum sunscreen SPF 30+ to sun-exposed areas, reapply every 2 hours as needed.  Recommend staying in the shade or wearing long sleeves, sun glasses (UVA+UVB protection) and wide brim hats (4-inch brim around the entire circumference of the hat). Call for new or changing lesions. Destruction of lesion - arms, hands, face, ears x32 (32) Complexity: simple   Destruction method: cryotherapy   Informed consent: discussed and consent obtained   Timeout:  patient name, date of birth, surgical site, and procedure verified Lesion destroyed using liquid nitrogen: Yes   Region frozen until ice ball extended beyond lesion: Yes   Outcome: patient tolerated procedure well with no complications   Post-procedure details: wound care instructions given   Additional details:  Prior to procedure, discussed risks of blister formation, small wound, skin dyspigmentation, or rare scar following cryotherapy. Recommend Vaseline ointment to treated areas while healing.  NEOPLASM OF SKIN Left dorsal hand near thumb Epidermal / dermal shaving  Lesion diameter (cm):  0.6 Informed consent: discussed and consent obtained   Timeout: patient name, date of birth, surgical site, and procedure verified   Procedure prep:  Patient was prepped and draped  in usual sterile fashion Prep type:  Isopropyl alcohol  Anesthesia: the lesion was anesthetized in a standard fashion   Anesthetic:  1% lidocaine  w/ epinephrine  1-100,000 buffered w/ 8.4% NaHCO3 Instrument used: flexible razor blade   Hemostasis achieved with: pressure, aluminum chloride and electrodesiccation   Outcome: patient tolerated procedure well   Post-procedure details: sterile dressing applied and wound care instructions given   Dressing type: bandage and petrolatum    Destruction of lesion Complexity: extensive   Destruction method: electrodesiccation and curettage   Informed consent: discussed and consent obtained   Timeout:  patient name, date of birth, surgical site, and procedure verified Procedure prep:  Patient was prepped and draped in usual sterile fashion Prep type:  Isopropyl alcohol  Anesthesia: the lesion was anesthetized in a standard fashion   Anesthetic:  1% lidocaine  w/ epinephrine  1-100,000 buffered w/ 8.4% NaHCO3 Curettage performed in three different directions: Yes   Electrodesiccation performed over the curetted area: Yes   Curettage cycles:  3 Lesion length (cm):  0.6 Lesion width (cm):  0.6 Margin per side (cm):  0.2 Final wound size (cm):  1 Hemostasis achieved with:  pressure and aluminum chloride Outcome: patient tolerated procedure well with no complications   Post-procedure details: sterile dressing applied and wound care instructions given   Dressing type: bandage and petrolatum   Specimen 1 - Surgical pathology Differential Diagnosis: R/O SCC  Check Margins: No  EDC today  SKIN CANCER SCREENING   ACTINIC SKIN DAMAGE   LENTIGO   MELANOCYTIC NEVUS, UNSPECIFIED LOCATION   HISTORY OF BASAL CELL CARCINOMA   HISTORY OF SQUAMOUS CELL CARCINOMA   PURPURA (HCC)   Skin cancer screening performed today.  HISTORY OF BASAL CELL CARCINOMA OF THE SKIN - No evidence of recurrence today - Recommend regular full body skin exams -  Recommend daily broad spectrum sunscreen SPF 30+ to sun-exposed areas, reapply every 2 hours as needed.  - Call if any new or changing lesions are noted between office visits  HISTORY OF SQUAMOUS CELL CARCINOMA OF THE SKIN - No evidence of recurrence today - No lymphadenopathy - Recommend regular full body skin exams - Recommend daily broad spectrum sunscreen SPF 30+ to sun-exposed areas, reapply every 2 hours as needed.  - Call if any new or changing lesions are noted between office visits  Actinic Damage - Chronic condition, secondary to cumulative UV/sun exposure - diffuse scaly erythematous macules with underlying dyspigmentation - Recommend daily broad spectrum sunscreen SPF 30+ to sun-exposed areas, reapply every 2 hours as needed.  - Staying in the shade or wearing long sleeves, sun glasses (UVA+UVB protection) and wide brim hats (4-inch brim around the entire circumference of the hat) are also recommended for sun protection.  - Call for new or changing lesions.  Lentigines, Seborrheic Keratoses, Hemangiomas - Benign normal skin lesions - Benign-appearing - Call for any changes  Melanocytic Nevi - Tan-brown and/or pink-flesh-colored symmetric macules and papules - Benign appearing on exam today - Observation - Call clinic for new or changing moles - Recommend daily use of broad spectrum spf 30+ sunscreen to sun-exposed areas.   Purpura - Chronic; persistent and recurrent.  Treatable, but not curable. - Violaceous macules and patches at arms and hands. - Benign - Related to trauma, age, sun damage and/or use of blood thinners, chronic  use of topical and/or oral steroids - Observe - Can use OTC arnica containing moisturizer such as Dermend Bruise Formula if desired - Call for worsening or other concerns   Return in about 8 months (around 11/18/2024) for TBSE, HxBCC, HxSCC, HxAK.  I, Jill Parcell, CMA, am acting as scribe for Alm Rhyme, MD.   Documentation: I have  reviewed the above documentation for accuracy and completeness, and I agree with the above.  Alm Rhyme, MD

## 2024-03-20 ENCOUNTER — Ambulatory Visit: Payer: Self-pay | Admitting: Dermatology

## 2024-03-20 LAB — SURGICAL PATHOLOGY

## 2024-05-06 DIAGNOSIS — G5603 Carpal tunnel syndrome, bilateral upper limbs: Secondary | ICD-10-CM | POA: Diagnosis not present

## 2024-05-06 DIAGNOSIS — M67431 Ganglion, right wrist: Secondary | ICD-10-CM | POA: Diagnosis not present

## 2024-05-13 DIAGNOSIS — H52223 Regular astigmatism, bilateral: Secondary | ICD-10-CM | POA: Diagnosis not present

## 2024-05-13 DIAGNOSIS — H524 Presbyopia: Secondary | ICD-10-CM | POA: Diagnosis not present

## 2024-05-13 DIAGNOSIS — Z961 Presence of intraocular lens: Secondary | ICD-10-CM | POA: Diagnosis not present

## 2024-05-15 DIAGNOSIS — Z03818 Encounter for observation for suspected exposure to other biological agents ruled out: Secondary | ICD-10-CM | POA: Diagnosis not present

## 2024-05-15 DIAGNOSIS — R059 Cough, unspecified: Secondary | ICD-10-CM | POA: Diagnosis not present

## 2024-06-15 DIAGNOSIS — G5603 Carpal tunnel syndrome, bilateral upper limbs: Secondary | ICD-10-CM | POA: Diagnosis not present

## 2024-06-22 DIAGNOSIS — R109 Unspecified abdominal pain: Secondary | ICD-10-CM | POA: Diagnosis not present

## 2024-06-22 DIAGNOSIS — G5603 Carpal tunnel syndrome, bilateral upper limbs: Secondary | ICD-10-CM | POA: Diagnosis not present

## 2024-06-22 DIAGNOSIS — I10 Essential (primary) hypertension: Secondary | ICD-10-CM | POA: Diagnosis not present

## 2024-06-22 DIAGNOSIS — M545 Low back pain, unspecified: Secondary | ICD-10-CM | POA: Diagnosis not present

## 2024-06-22 DIAGNOSIS — R600 Localized edema: Secondary | ICD-10-CM | POA: Diagnosis not present

## 2024-07-02 DIAGNOSIS — E782 Mixed hyperlipidemia: Secondary | ICD-10-CM | POA: Diagnosis not present

## 2024-07-02 DIAGNOSIS — R7303 Prediabetes: Secondary | ICD-10-CM | POA: Diagnosis not present

## 2024-07-02 DIAGNOSIS — I10 Essential (primary) hypertension: Secondary | ICD-10-CM | POA: Diagnosis not present

## 2024-07-02 DIAGNOSIS — E538 Deficiency of other specified B group vitamins: Secondary | ICD-10-CM | POA: Diagnosis not present

## 2024-07-09 DIAGNOSIS — Z1331 Encounter for screening for depression: Secondary | ICD-10-CM | POA: Diagnosis not present

## 2024-07-09 DIAGNOSIS — Z23 Encounter for immunization: Secondary | ICD-10-CM | POA: Diagnosis not present

## 2024-07-09 DIAGNOSIS — R7303 Prediabetes: Secondary | ICD-10-CM | POA: Diagnosis not present

## 2024-07-09 DIAGNOSIS — E782 Mixed hyperlipidemia: Secondary | ICD-10-CM | POA: Diagnosis not present

## 2024-07-09 DIAGNOSIS — I10 Essential (primary) hypertension: Secondary | ICD-10-CM | POA: Diagnosis not present

## 2024-07-09 DIAGNOSIS — Z0000 Encounter for general adult medical examination without abnormal findings: Secondary | ICD-10-CM | POA: Diagnosis not present

## 2024-07-09 DIAGNOSIS — G4733 Obstructive sleep apnea (adult) (pediatric): Secondary | ICD-10-CM | POA: Diagnosis not present

## 2024-07-09 DIAGNOSIS — I482 Chronic atrial fibrillation, unspecified: Secondary | ICD-10-CM | POA: Diagnosis not present

## 2024-07-09 DIAGNOSIS — I6203 Nontraumatic chronic subdural hemorrhage: Secondary | ICD-10-CM | POA: Diagnosis not present

## 2024-08-07 DIAGNOSIS — J0100 Acute maxillary sinusitis, unspecified: Secondary | ICD-10-CM | POA: Diagnosis not present

## 2024-08-07 DIAGNOSIS — R0982 Postnasal drip: Secondary | ICD-10-CM | POA: Diagnosis not present

## 2024-08-07 DIAGNOSIS — R059 Cough, unspecified: Secondary | ICD-10-CM | POA: Diagnosis not present

## 2024-08-25 DIAGNOSIS — J301 Allergic rhinitis due to pollen: Secondary | ICD-10-CM | POA: Diagnosis not present

## 2024-08-25 DIAGNOSIS — R059 Cough, unspecified: Secondary | ICD-10-CM | POA: Diagnosis not present

## 2024-11-18 ENCOUNTER — Ambulatory Visit: Admitting: Dermatology

## 2024-11-25 ENCOUNTER — Ambulatory Visit: Admitting: Dermatology
